# Patient Record
Sex: Male | Born: 1960 | Race: Black or African American | Hispanic: No | Marital: Single | Smoking: Current some day smoker
Health system: Southern US, Community
[De-identification: ages and names within clinical notes are randomized; demographics above are authoritative.]

## PROBLEM LIST (undated history)

## (undated) DIAGNOSIS — G4733 Obstructive sleep apnea (adult) (pediatric): Secondary | ICD-10-CM

## (undated) DIAGNOSIS — J309 Allergic rhinitis, unspecified: Secondary | ICD-10-CM

## (undated) DIAGNOSIS — G629 Polyneuropathy, unspecified: Secondary | ICD-10-CM

## (undated) DIAGNOSIS — Z8709 Personal history of other diseases of the respiratory system: Secondary | ICD-10-CM

## (undated) DIAGNOSIS — K219 Gastro-esophageal reflux disease without esophagitis: Secondary | ICD-10-CM

## (undated) DIAGNOSIS — M549 Dorsalgia, unspecified: Secondary | ICD-10-CM

## (undated) DIAGNOSIS — R7303 Prediabetes: Secondary | ICD-10-CM

## (undated) DIAGNOSIS — M1990 Unspecified osteoarthritis, unspecified site: Secondary | ICD-10-CM

## (undated) DIAGNOSIS — G8929 Other chronic pain: Secondary | ICD-10-CM

## (undated) DIAGNOSIS — I10 Essential (primary) hypertension: Secondary | ICD-10-CM

## (undated) DIAGNOSIS — F329 Major depressive disorder, single episode, unspecified: Secondary | ICD-10-CM

## (undated) DIAGNOSIS — M75101 Unspecified rotator cuff tear or rupture of right shoulder, not specified as traumatic: Secondary | ICD-10-CM

## (undated) DIAGNOSIS — G4730 Sleep apnea, unspecified: Secondary | ICD-10-CM

## (undated) DIAGNOSIS — Z973 Presence of spectacles and contact lenses: Secondary | ICD-10-CM

## (undated) DIAGNOSIS — F411 Generalized anxiety disorder: Secondary | ICD-10-CM

## (undated) DIAGNOSIS — F32A Depression, unspecified: Secondary | ICD-10-CM

## (undated) DIAGNOSIS — F419 Anxiety disorder, unspecified: Secondary | ICD-10-CM

## (undated) DIAGNOSIS — J45909 Unspecified asthma, uncomplicated: Secondary | ICD-10-CM

## (undated) DIAGNOSIS — E785 Hyperlipidemia, unspecified: Secondary | ICD-10-CM

## (undated) HISTORY — PX: LUMBAR SPINE SURGERY: SHX701

## (undated) HISTORY — DX: Essential (primary) hypertension: I10

## (undated) HISTORY — DX: Depression, unspecified: F32.A

## (undated) HISTORY — DX: Sleep apnea, unspecified: G47.30

## (undated) HISTORY — DX: Unspecified osteoarthritis, unspecified site: M19.90

## (undated) HISTORY — DX: Hyperlipidemia, unspecified: E78.5

---

## 1998-01-20 ENCOUNTER — Emergency Department (HOSPITAL_COMMUNITY): Admission: EM | Admit: 1998-01-20 | Discharge: 1998-01-20 | Payer: Self-pay | Admitting: Emergency Medicine

## 1999-04-29 ENCOUNTER — Emergency Department (HOSPITAL_COMMUNITY): Admission: EM | Admit: 1999-04-29 | Discharge: 1999-04-29 | Payer: Self-pay | Admitting: Emergency Medicine

## 2005-09-15 ENCOUNTER — Emergency Department: Payer: Self-pay | Admitting: Emergency Medicine

## 2007-03-17 ENCOUNTER — Emergency Department (HOSPITAL_COMMUNITY): Admission: EM | Admit: 2007-03-17 | Discharge: 2007-03-17 | Payer: Self-pay | Admitting: Emergency Medicine

## 2008-10-07 ENCOUNTER — Emergency Department (HOSPITAL_COMMUNITY): Admission: EM | Admit: 2008-10-07 | Discharge: 2008-10-07 | Payer: Self-pay | Admitting: Emergency Medicine

## 2009-11-14 ENCOUNTER — Emergency Department (HOSPITAL_COMMUNITY): Admission: EM | Admit: 2009-11-14 | Discharge: 2009-11-14 | Payer: Self-pay | Admitting: Emergency Medicine

## 2013-11-05 ENCOUNTER — Emergency Department (HOSPITAL_COMMUNITY)
Admission: EM | Admit: 2013-11-05 | Discharge: 2013-11-05 | Disposition: A | Discharge status: Home / Self Care | Payer: Worker's Compensation | Attending: Emergency Medicine | Admitting: Emergency Medicine

## 2013-11-05 ENCOUNTER — Encounter (HOSPITAL_COMMUNITY): Payer: Self-pay | Admitting: Emergency Medicine

## 2013-11-05 DIAGNOSIS — Y9389 Activity, other specified: Secondary | ICD-10-CM | POA: Insufficient documentation

## 2013-11-05 DIAGNOSIS — X500XXA Overexertion from strenuous movement or load, initial encounter: Secondary | ICD-10-CM | POA: Insufficient documentation

## 2013-11-05 DIAGNOSIS — S3992XA Unspecified injury of lower back, initial encounter: Secondary | ICD-10-CM

## 2013-11-05 DIAGNOSIS — F17200 Nicotine dependence, unspecified, uncomplicated: Secondary | ICD-10-CM | POA: Insufficient documentation

## 2013-11-05 DIAGNOSIS — Z880 Allergy status to penicillin: Secondary | ICD-10-CM | POA: Insufficient documentation

## 2013-11-05 DIAGNOSIS — IMO0002 Reserved for concepts with insufficient information to code with codable children: Secondary | ICD-10-CM | POA: Insufficient documentation

## 2013-11-05 DIAGNOSIS — Z791 Long term (current) use of non-steroidal anti-inflammatories (NSAID): Secondary | ICD-10-CM | POA: Insufficient documentation

## 2013-11-05 DIAGNOSIS — Y990 Civilian activity done for income or pay: Secondary | ICD-10-CM | POA: Insufficient documentation

## 2013-11-05 DIAGNOSIS — J45909 Unspecified asthma, uncomplicated: Secondary | ICD-10-CM | POA: Insufficient documentation

## 2013-11-05 DIAGNOSIS — Y929 Unspecified place or not applicable: Secondary | ICD-10-CM | POA: Insufficient documentation

## 2013-11-05 HISTORY — DX: Unspecified asthma, uncomplicated: J45.909

## 2013-11-05 MED ORDER — PREDNISONE 20 MG PO TABS
60.0000 mg | ORAL_TABLET | Freq: Once | ORAL | Status: AC
  Administered 2013-11-05: 60 mg via ORAL
  Filled 2013-11-05: qty 3

## 2013-11-05 MED ORDER — OXYCODONE HCL 5 MG PO TABS
10.0000 mg | ORAL_TABLET | Freq: Once | ORAL | Status: AC
  Administered 2013-11-05: 10 mg via ORAL
  Filled 2013-11-05: qty 2

## 2013-11-05 MED ORDER — DIAZEPAM 5 MG PO TABS
10.0000 mg | ORAL_TABLET | Freq: Once | ORAL | Status: AC
  Administered 2013-11-05: 10 mg via ORAL
  Filled 2013-11-05: qty 2

## 2013-11-05 MED ORDER — PREDNISONE 20 MG PO TABS: 20.0000 mg | ORAL_TABLET | Freq: Two times a day (BID) | ORAL | Status: DC

## 2013-11-05 MED ORDER — OXYCODONE HCL 5 MG PO TABS: 5.0000 mg | ORAL_TABLET | ORAL | Status: DC | PRN

## 2013-11-05 MED ORDER — DIAZEPAM 10 MG PO TABS: 10.0000 mg | ORAL_TABLET | Freq: Four times a day (QID) | ORAL | Status: DC | PRN

## 2013-11-05 NOTE — ED Notes (Signed)
Pt presents with lower back pain starting into his mid lower back radiating to Right side down into his Right leg then traveled back up to his Left lower back and the pain seems to radiate to his Right testicle. Pt states his symptoms started yesterday after lifting several 25 lb boxes while at work. Pt reports increase pain with any movement, unable to bend over and put his own socks on or take them off. Pt states his pain is constant with intermittent sharp shooting pain

## 2013-11-05 NOTE — ED Provider Notes (Signed)
CSN: [id number]     Arrival date & time 11/05/13  1248 History   First MD Initiated Contact with Patient 11/05/13 1308     Chief Complaint  Patient presents with  . Back Pain     (Consider location/radiation/quality/duration/timing/severity/associated sxs/prior Treatment) Patient is a 53 y.o. male presenting with back pain. The history is provided by the patient.  Back Pain  He injured his back yesterday while lifting at work. He reinjured it. This morning, again, lifting. The pain radiates to his right and left leg. He is able to ambulate. He denies fecal incontinence or perineal anesthesia. He does not have urinary incontinence, but he did dribble some after urinating, today. He denies fever, chills, nausea, vomiting, isolated, weakness, or paresthesias. He has not tried any medication for the problem. He does not have any ongoing back pain. There are no other known modifying factors.  Past Medical History  Diagnosis Date  . Asthma    History reviewed. No pertinent past surgical history. History reviewed. No pertinent family history. History  Substance Use Topics  . Smoking status: Current Every Day Smoker  . Smokeless tobacco: Not on file  . Alcohol Use: Yes    Review of Systems  Musculoskeletal: Positive for back pain.  All other systems reviewed and are negative.     Allergies  Tylenol and Penicillins  Home Medications   Prior to Admission medications   Medication Sig Start Date End Date Taking? Authorizing Provider  cetirizine-pseudoephedrine (ZYRTEC-D) 5-120 MG per tablet Take 1 tablet by mouth daily as needed for allergies.   Yes Historical Provider, MD  meloxicam (MOBIC) 15 MG tablet Take 15 mg by mouth daily.   Yes Historical Provider, MD   BP 129/89  Pulse 75  Temp(Src) 98.6 F (37 C) (Oral)  Resp 18  Wt 240 lb (108.863 kg)  SpO2 97% Physical Exam  Nursing note and vitals reviewed. Constitutional: He is oriented to Klecker, place, and time. He appears  well-developed and well-nourished.  HENT:  Head: Normocephalic and atraumatic.  Right Ear: External ear normal.  Left Ear: External ear normal.  Eyes: Conjunctivae and EOM are normal. Pupils are equal, round, and reactive to light.  Neck: Normal range of motion and phonation normal. Neck supple.  Cardiovascular: Normal rate, regular rhythm, normal heart sounds and intact distal pulses.   Pulmonary/Chest: Effort normal and breath sounds normal. He exhibits no bony tenderness.  Abdominal: Soft. There is no tenderness.  Musculoskeletal: Normal range of motion.  Diffuse lumbar tenderness, in the midline, and bilaterally, with decreased flexion of the back, secondary to pain. Is able to walk with a normal gait.  Neurological: He is alert and oriented to Louks, place, and time. No cranial nerve deficit or sensory deficit. He exhibits normal muscle tone. Coordination normal.  Skin: Skin is warm, dry and intact.  Psychiatric: He has a normal mood and affect. His behavior is normal. Judgment and thought content normal.    ED Course  Procedures (including critical care time)  Medications  diazepam (VALIUM) tablet 10 mg (10 mg Oral Given 11/05/13 1431)  predniSONE (DELTASONE) tablet 60 mg (60 mg Oral Given 11/05/13 1431)  oxyCODONE (Oxy IR/ROXICODONE) immediate release tablet 10 mg (10 mg Oral Given 11/05/13 1431)    No data found.      Labs Review Labs Reviewed - No data to display  Imaging Review No results found.   EKG Interpretation None      MDM   Final diagnoses:  Back  injury    Muscle strain, back, causing pain. Doubt cauda equina syndrome, acute herniated disc or nerve impingement.  Nursing Notes Reviewed/ Care Coordinated Applicable Imaging Reviewed Interpretation of Laboratory Data incorporated into ED treatment  The patient appears reasonably screened and/or stabilized for discharge and I doubt any other medical condition or other [facility] requiring further screening,  evaluation, or treatment in the ED at this time prior to discharge.  Plan: Home Medications- oxycodone, prednisone; Home Treatments- rest ice; return here if the recommended treatment, does not improve the symptoms; Recommended follow up- PCP, when necessary    Richarda Blade, MD 11/07/13 [phone]

## 2013-11-05 NOTE — Discharge Instructions (Signed)
Back Exercises  Back exercises help treat and prevent back injuries. The goal is to increase your strength in your belly (abdominal) and back muscles. These exercises can also help with flexibility. Start these exercises when told by your doctor.  HOME CARE  Back exercises include:  Pelvic Tilt.  · Lie on your back with your knees bent. Tilt your pelvis until the lower part of your back is against the floor. Hold this position 5 to 10 sec. Repeat this exercise 5 to 10 times.  Knee to Chest.  · Pull 1 knee up against your chest and hold for 20 to 30 seconds. Repeat this with the other knee. This may be done with the other leg straight or bent, whichever feels better. Then, pull both knees up against your chest.  Sit-Ups or Curl-Ups.  · Bend your knees 90 degrees. Start with tilting your pelvis, and do a partial, slow sit-up. Only lift your upper half 30 to 45 degrees off the floor. Take at least 2 to 3 seonds for each sit-up. Do not do sit-ups with your knees out straight. If partial sit-ups are difficult, simply do the above but with only tightening your belly (abdominal) muscles and holding it as told.  Hip-Lift.  · Lie on your back with your knees flexed 90 degrees. Push down with your feet and shoulders as you raise your hips 2 inches off the floor. Hold for 10 seconds, repeat 5 to 10 times.  Back Arches.  · Lie on your stomach. Prop yourself up on bent elbows. Slowly press on your hands, causing an arch in your low back. Repeat 3 to 5 times.  Shoulder-Lifts.  · Lie face down with arms beside your body. Keep hips and belly pressed to floor as you slowly lift your head and shoulders off the floor.  Do not overdo your exercises. Be careful in the beginning. Exercises may cause you some mild back discomfort. If the pain lasts for more than 15 minutes, stop the exercises until you see your doctor. Improvement with exercise for back problems is slow.   Document Released: 07/18/2010 Document Revised: 09/07/2011  Document Reviewed: 04/16/2011  ExitCare® Patient Information ©2014 [employer].

## 2013-12-03 ENCOUNTER — Encounter (HOSPITAL_COMMUNITY): Payer: Self-pay | Admitting: Emergency Medicine

## 2013-12-03 ENCOUNTER — Emergency Department (HOSPITAL_COMMUNITY)
Admission: EM | Admit: 2013-12-03 | Discharge: 2013-12-03 | Disposition: A | Discharge status: Home / Self Care | Payer: Worker's Compensation | Attending: Emergency Medicine | Admitting: Emergency Medicine

## 2013-12-03 DIAGNOSIS — J45909 Unspecified asthma, uncomplicated: Secondary | ICD-10-CM | POA: Insufficient documentation

## 2013-12-03 DIAGNOSIS — M549 Dorsalgia, unspecified: Secondary | ICD-10-CM

## 2013-12-03 DIAGNOSIS — Y929 Unspecified place or not applicable: Secondary | ICD-10-CM | POA: Insufficient documentation

## 2013-12-03 DIAGNOSIS — W1809XA Striking against other object with subsequent fall, initial encounter: Secondary | ICD-10-CM | POA: Insufficient documentation

## 2013-12-03 DIAGNOSIS — Z880 Allergy status to penicillin: Secondary | ICD-10-CM | POA: Insufficient documentation

## 2013-12-03 DIAGNOSIS — S336XXA Sprain of sacroiliac joint, initial encounter: Secondary | ICD-10-CM | POA: Insufficient documentation

## 2013-12-03 DIAGNOSIS — Y9389 Activity, other specified: Secondary | ICD-10-CM | POA: Insufficient documentation

## 2013-12-03 DIAGNOSIS — S239XXA Sprain of unspecified parts of thorax, initial encounter: Secondary | ICD-10-CM | POA: Insufficient documentation

## 2013-12-03 DIAGNOSIS — F17200 Nicotine dependence, unspecified, uncomplicated: Secondary | ICD-10-CM | POA: Insufficient documentation

## 2013-12-03 DIAGNOSIS — S29012A Strain of muscle and tendon of back wall of thorax, initial encounter: Secondary | ICD-10-CM

## 2013-12-03 MED ORDER — MORPHINE SULFATE 4 MG/ML IJ SOLN
6.0000 mg | Freq: Once | INTRAMUSCULAR | Status: AC
  Administered 2013-12-03: 6 mg via INTRAMUSCULAR
  Filled 2013-12-03: qty 2

## 2013-12-03 MED ORDER — DIAZEPAM 5 MG PO TABS: 5.0000 mg | ORAL_TABLET | Freq: Four times a day (QID) | ORAL | Status: DC | PRN

## 2013-12-03 MED ORDER — IBUPROFEN 600 MG PO TABS: 600.0000 mg | ORAL_TABLET | Freq: Four times a day (QID) | ORAL | Status: DC | PRN

## 2013-12-03 MED ORDER — KETOROLAC TROMETHAMINE 30 MG/ML IJ SOLN
30.0000 mg | Freq: Once | INTRAMUSCULAR | Status: AC
  Administered 2013-12-03: 30 mg via INTRAVENOUS
  Filled 2013-12-03: qty 1

## 2013-12-03 MED ORDER — TRAMADOL HCL 50 MG PO TABS: 50.0000 mg | ORAL_TABLET | Freq: Four times a day (QID) | ORAL | Status: DC | PRN

## 2013-12-03 NOTE — ED Notes (Signed)
Pt. lost his balance and fell while lifting something this evening , no LOC / ambulatory , reports pain at right lower back / denies hematuria , alert and oriented / respirations unlabored . Pt. stated history of chronic low back pain .

## 2013-12-03 NOTE — ED Notes (Signed)
Pt states that he hurt his back a few weeks ago, lifted heavy box today and had sharp pain in back while lifting, fell back and hit the ground. Denies hitting head. Pain radiated down right leg and up right neck, over to left side.

## 2013-12-03 NOTE — ED Provider Notes (Signed)
CSN: [id number]     Arrival date & time 12/03/13  0009 History   First MD Initiated Contact with Patient 12/03/13 0109     Chief Complaint  Patient presents with  . Fall  . Back Pain     (Consider location/radiation/quality/duration/timing/severity/associated sxs/prior Treatment) HPI This patient is a 53 yo man in generally good health who has a recent history of right low back strain. Tonight, just pta, he was picking up an object when he had acute sharp pain to right low back. This caused him to fall backward, landing on his bottom. He felt backward because of jolt of severe pain. Pain has been persistent. It is aching, sharp and worse with movements of the torso. No radiation of pain, no paresthesias or motor weakness. Patient did not take any meds PTA.   Past Medical History  Diagnosis Date  . Asthma    History reviewed. No pertinent past surgical history. No family history on file. History  Substance Use Topics  . Smoking status: Current Every Day Smoker  . Smokeless tobacco: Not on file  . Alcohol Use: Yes    Review of Systems Ten point review of symptoms performed and is negative with the exception of symptoms noted above.     Allergies  Tylenol and Penicillins  Home Medications   Prior to Admission medications   Not on File   BP 134/85  Pulse 78  Temp(Src) 97.8 F (36.6 C) (Oral)  Resp 14  Wt 243 lb 5 oz (110.366 kg)  SpO2 98% Physical Exam Gen: well developed and well nourished appearing Head: NCAT Eyes: PERL, EOMI Nose: no epistaixis or rhinorrhea Mouth/throat: mucosa is moist and pink Neck: supple, no stridor Lungs: CTA B, no wheezing, rhonchi or rales CV: RRR, no murmur, extremities appear well perfused.  Abd: soft, notender, nondistended Back: no midline ttp, ttp over the right QL muscle with palpable muscle spasm and reproducible pain on palpation of this area.  Skin: warm and dry Ext: normal to inspection, no dependent edema Neuro: CN ii-xii  grossly intact, no focal deficits, motor strength 5/5 both legs with normal DTRs Psyche; normal affect,  calm and cooperative.   ED Course  Procedures (including critical care time)   MDM   Acute low back pain secondary muscle strain. We are managing symptomatically and will discharge.     Elyn Peers, MD 12/03/13 [phone]

## 2013-12-03 NOTE — Discharge Instructions (Signed)
Back Pain, Adult  Back pain is very common. The pain often gets better over time. The cause of back pain is usually not dangerous. Most people can learn to manage their back pain on their own.   HOME CARE   · Stay active. Start with short walks on flat ground if you can. Try to walk farther each day.  · Do not sit, drive, or stand in one place for more than 30 minutes. Do not stay in bed.  · Do not avoid exercise or work. Activity can help your back heal faster.  · Be careful when you bend or lift an object. Bend at your knees, keep the object close to you, and do not twist.  · Sleep on a firm mattress. Lie on your side, and bend your knees. If you lie on your back, put a pillow under your knees.  · Only take medicines as told by your doctor.  · Put ice on the injured area.  · Put ice in a plastic bag.  · Place a towel between your skin and the bag.  · Leave the ice on for 15-20 minutes, 03-04 times a day for the first 2 to 3 days. After that, you can switch between ice and heat packs.  · Ask your doctor about back exercises or massage.  · Avoid feeling anxious or stressed. Find good ways to deal with stress, such as exercise.  GET HELP RIGHT AWAY IF:   · Your pain does not go away with rest or medicine.  · Your pain does not go away in 1 week.  · You have new problems.  · You do not feel well.  · The pain spreads into your legs.  · You cannot control when you poop (bowel movement) or pee (urinate).  · Your arms or legs feel weak or lose feeling (numbness).  · You feel sick to your stomach (nauseous) or throw up (vomit).  · You have belly (abdominal) pain.  · You feel like you may pass out (faint).  MAKE SURE YOU:   · Understand these instructions.  · Will watch your condition.  · Will get help right away if you are not doing well or get worse.  Document Released: 12/02/2007 Document Revised: 09/07/2011 Document Reviewed: 11/03/2010  ExitCare® Patient Information ©2014 [employer].

## 2013-12-05 ENCOUNTER — Ambulatory Visit (HOSPITAL_BASED_OUTPATIENT_CLINIC_OR_DEPARTMENT_OTHER)
Admission: RE | Admit: 2013-12-05 | Discharge: 2013-12-05 | Disposition: A | Discharge status: Home / Self Care | Payer: Worker's Compensation | Source: Ambulatory Visit | Attending: Family Medicine | Admitting: Family Medicine

## 2013-12-05 ENCOUNTER — Ambulatory Visit (INDEPENDENT_AMBULATORY_CARE_PROVIDER_SITE_OTHER): Payer: Worker's Compensation | Admitting: Family Medicine

## 2013-12-05 ENCOUNTER — Encounter: Payer: Self-pay | Admitting: Family Medicine

## 2013-12-05 VITALS — BP 144/93 | HR 69 | Ht 75.0 in | Wt 240.0 lb

## 2013-12-05 DIAGNOSIS — IMO0002 Reserved for concepts with insufficient information to code with codable children: Secondary | ICD-10-CM

## 2013-12-05 DIAGNOSIS — M545 Low back pain, unspecified: Secondary | ICD-10-CM

## 2013-12-05 DIAGNOSIS — M47817 Spondylosis without myelopathy or radiculopathy, lumbosacral region: Secondary | ICD-10-CM | POA: Insufficient documentation

## 2013-12-05 DIAGNOSIS — S3992XA Unspecified injury of lower back, initial encounter: Secondary | ICD-10-CM

## 2013-12-05 MED ORDER — OXYCODONE HCL 5 MG PO TABS: 5.0000 mg | ORAL_TABLET | Freq: Four times a day (QID) | ORAL | Status: DC | PRN

## 2013-12-05 MED ORDER — PREDNISONE (PAK) 10 MG PO TABS: ORAL_TABLET | ORAL | Status: DC

## 2013-12-05 NOTE — Patient Instructions (Signed)
Get x-rays before you leave today. We will arrange an MRI of your lumbar spine. Take prednisone for 6 days as directed. Oxycodone as needed. Follow up will depend on the MRI results. Out of work for 2 weeks.

## 2013-12-07 ENCOUNTER — Encounter: Payer: Self-pay | Admitting: Family Medicine

## 2013-12-07 ENCOUNTER — Other Ambulatory Visit: Payer: Self-pay | Admitting: *Deleted

## 2013-12-07 DIAGNOSIS — S3992XA Unspecified injury of lower back, initial encounter: Secondary | ICD-10-CM

## 2013-12-07 NOTE — Progress Notes (Addendum)
Patient ID: Todd Ryan, male   DOB: [birth year], 53 y.o.   MRN: [id number]  PCP: No PCP Per Patient  Subjective:   HPI: Patient is a 53 y.o. male here for low back injury.  Patient reports on 5/9 he was lifting a heavy 5 gallon container of sugar at work when he felt a pull in right side of low back. Improved some from this until he believes 6/7 when he was pulling a box from a van at work, stepped backwards and fell to his right side sustaining worsening sharp pain in same area right side of back. Pain radiates down to knee, into right testicle. No numbness or tingling. No bowel/bladder dysfunction or saddle anesthesia. Went to ED and given valium, ibuprofen and tramadol which he's been taking. No radiographs done.  Past Medical History  Diagnosis Date  . Asthma     No current outpatient prescriptions on file prior to visit.   No current facility-administered medications on file prior to visit.    History reviewed. No pertinent past surgical history.  Allergies  Allergen Reactions  . Tylenol [Acetaminophen] Anaphylaxis  . Penicillins Hives    History   Social History  . Marital Status: Married    Spouse Name: N/A    Number of Children: N/A  . Years of Education: N/A   Occupational History  . Not on file.   Social History Main Topics  . Smoking status: Current Every Day Smoker  . Smokeless tobacco: Not on file  . Alcohol Use: Yes  . Drug Use: No  . Sexual Activity: Not on file   Other Topics Concern  . Not on file   Social History Narrative  . No narrative on file    History reviewed. No pertinent family history.  BP 144/93  Pulse 69  Ht 6\' 3"  (1.905 m)  Wt 240 lb (108.863 kg)  BMI 30.00 kg/m2  Review of Systems: See HPI above.    Objective:  Physical Exam:  Gen: NAD but appears uncomfortable.  Back: No gross deformity, scoliosis. TTP right lumbar paraspinal region.  No midline or bony TTP. ROM 30 degrees flexion, 10 degrees extension but  both painful. Strength LEs 5/5 all muscle groups.   2+ MSRs in patellar and achilles tendons, equal bilaterally. Mild positive SLR on right, negative left. Sensation intact to light touch bilaterally. Negative logroll bilateral hips Negative fabers and piriformis stretches.    Assessment & Plan:  1. Low back injury - consistent with lumbar radiculopathy from herniated disc in addition to lumbar spasms.  Given prednisone dose pack to take for 6 days with oxycodone as needed.  Will go ahead with MRI given severity, radiation into genital region and that he's not improving as expected.  Out of work for 2 weeks while we obtain MRI.  Plan to ahead with with possible ESI, neurosurgery referral, PT depending on those results.  Addendum:  MRI report received, reviewed, and discussed with patient.  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  Still having symptoms radiating into right genital region.  No incontinence.  He would like to try ESI at this level - will arrange and have him f/u 2 weeks after this.  Consider PT, repeat injection depending on how he's doing.  Continue out of work until follow-up.

## 2013-12-07 NOTE — Assessment & Plan Note (Signed)
consistent with lumbar radiculopathy from herniated disc in addition to lumbar spasms.  Given prednisone dose pack to take for 6 days with oxycodone as needed.  Will go ahead with MRI given severity, radiation into genital region and that he's not improving as expected.  Out of work for 2 weeks while we obtain MRI.  Plan to ahead with with possible ESI, neurosurgery referral, PT depending on those results.

## 2013-12-12 ENCOUNTER — Telehealth: Payer: Self-pay | Admitting: Family Medicine

## 2013-12-12 NOTE — Telephone Encounter (Signed)
We are waiting to hear back from worker's comp regarding his MRI approval.  But his x-rays didn't show anything I would suspect causing his back pain - he does have some degenerative disc disease which is not unusual.

## 2013-12-18 ENCOUNTER — Other Ambulatory Visit: Payer: Self-pay | Admitting: *Deleted

## 2013-12-18 ENCOUNTER — Telehealth: Payer: Self-pay | Admitting: Family Medicine

## 2013-12-18 DIAGNOSIS — Z1389 Encounter for screening for other disorder: Secondary | ICD-10-CM

## 2013-12-18 MED ORDER — DIAZEPAM 5 MG PO TABS: ORAL_TABLET | ORAL | Status: DC

## 2013-12-18 NOTE — Telephone Encounter (Signed)
Ok to call in valium 5mg  - 1 tablet 30 minutes prior to procedure, can repeat x1.  Dispense 2 tabs with 0 refills.  Thanks!

## 2013-12-25 MED ORDER — OXYCODONE HCL 5 MG PO TABS: 5.0000 mg | ORAL_TABLET | Freq: Four times a day (QID) | ORAL | Status: DC | PRN

## 2013-12-25 NOTE — Addendum Note (Signed)
Addended by: Dene Gentry on: 12/25/2013 03:21 PM   Modules accepted: Orders

## 2014-01-03 ENCOUNTER — Other Ambulatory Visit: Payer: Self-pay | Admitting: Family Medicine

## 2014-01-03 DIAGNOSIS — M5416 Radiculopathy, lumbar region: Secondary | ICD-10-CM

## 2014-01-03 DIAGNOSIS — M5126 Other intervertebral disc displacement, lumbar region: Secondary | ICD-10-CM

## 2014-01-05 ENCOUNTER — Ambulatory Visit
Admission: RE | Admit: 2014-01-05 | Discharge: 2014-01-05 | Disposition: A | Discharge status: Home / Self Care | Payer: Worker's Compensation | Source: Ambulatory Visit | Attending: Family Medicine | Admitting: Family Medicine

## 2014-01-05 DIAGNOSIS — M5126 Other intervertebral disc displacement, lumbar region: Secondary | ICD-10-CM

## 2014-01-05 DIAGNOSIS — M5416 Radiculopathy, lumbar region: Secondary | ICD-10-CM

## 2014-01-05 MED ORDER — METHYLPREDNISOLONE ACETATE 40 MG/ML INJ SUSP (RADIOLOG
120.0000 mg | Freq: Once | INTRAMUSCULAR | Status: AC
  Administered 2014-01-05: 120 mg via EPIDURAL

## 2014-01-05 MED ORDER — IOHEXOL 180 MG/ML  SOLN
1.0000 mL | Freq: Once | INTRAMUSCULAR | Status: AC | PRN
  Administered 2014-01-05: 1 mL via EPIDURAL

## 2014-01-05 NOTE — Discharge Instructions (Signed)

## 2014-01-10 ENCOUNTER — Telehealth: Payer: Self-pay | Admitting: Family Medicine

## 2014-01-10 NOTE — Telephone Encounter (Signed)
This is not uncommon especially in the lag period between the numbing medicine (wears off within a day) and the cortisone (can take 1-2 weeks to kick in).  I would reassure him, take medicines as needed.  If still having trouble in a week we can talk about either repeating injection, physical therapy, or neurosurgery referral.

## 2014-01-10 NOTE — Telephone Encounter (Signed)
Spoke with patient and he stated that he got the [facility] on Friday (10th) and that on Sunday, he was having some shooting pain in his back due to standing for long periods of time.

## 2014-01-19 ENCOUNTER — Telehealth: Payer: Self-pay | Admitting: Family Medicine

## 2014-01-19 ENCOUNTER — Other Ambulatory Visit: Payer: Self-pay | Admitting: *Deleted

## 2014-01-19 MED ORDER — TRAMADOL HCL 50 MG PO TABS: 50.0000 mg | ORAL_TABLET | Freq: Three times a day (TID) | ORAL | Status: DC

## 2014-01-19 NOTE — Telephone Encounter (Signed)
We would have to step him down to tramadol if he wants further pain medicine - we would not refill this though.  Last phone call a week and a half ago I said if he was still having trouble in a week we would talk about either repeating injection, physical therapy, or neurosurgery referral.  Does he want to do any of these?

## 2014-01-22 ENCOUNTER — Telehealth: Payer: Self-pay | Admitting: Family Medicine

## 2014-01-22 DIAGNOSIS — M545 Low back pain, unspecified: Secondary | ICD-10-CM

## 2014-01-22 NOTE — Telephone Encounter (Signed)
Spoke with patient and told him that we would do Tramadol. Patient also stated that he wants to do PT. Will have to go through [facility] to set up the PT.

## 2014-01-23 ENCOUNTER — Other Ambulatory Visit: Payer: Self-pay | Admitting: *Deleted

## 2014-01-23 DIAGNOSIS — M545 Low back pain, unspecified: Secondary | ICD-10-CM

## 2014-01-23 MED ORDER — METHOCARBAMOL 500 MG PO TABS
500.0000 mg | ORAL_TABLET | Freq: Three times a day (TID) | ORAL | Status: DC | PRN
Start: 2014-01-23 — End: 2018-06-02

## 2014-01-23 MED ORDER — DICLOFENAC SODIUM 75 MG PO TBEC: 75.0000 mg | DELAYED_RELEASE_TABLET | Freq: Two times a day (BID) | ORAL | Status: DC

## 2014-01-23 NOTE — Telephone Encounter (Signed)
Will try combination of voltaren, robaxin, with tramadol as needed.  Also advised can try capsaicin topically.  He still hasn't heard about physical therapy from workers comp.

## 2014-02-28 ENCOUNTER — Ambulatory Visit (INDEPENDENT_AMBULATORY_CARE_PROVIDER_SITE_OTHER): Payer: Worker's Compensation | Admitting: Family Medicine

## 2014-02-28 ENCOUNTER — Encounter: Payer: Self-pay | Admitting: Family Medicine

## 2014-02-28 VITALS — BP 132/87 | HR 79 | Ht 74.0 in | Wt 235.0 lb

## 2014-02-28 DIAGNOSIS — M5430 Sciatica, unspecified side: Secondary | ICD-10-CM

## 2014-02-28 DIAGNOSIS — Z5189 Encounter for other specified aftercare: Secondary | ICD-10-CM | POA: Diagnosis not present

## 2014-02-28 DIAGNOSIS — IMO0002 Reserved for concepts with insufficient information to code with codable children: Secondary | ICD-10-CM | POA: Diagnosis not present

## 2014-02-28 DIAGNOSIS — M5441 Lumbago with sciatica, right side: Secondary | ICD-10-CM

## 2014-02-28 DIAGNOSIS — S3992XD Unspecified injury of lower back, subsequent encounter: Secondary | ICD-10-CM

## 2014-02-28 MED ORDER — OXYCODONE-ACETAMINOPHEN 5-325 MG PO TABS: 1.0000 | ORAL_TABLET | Freq: Four times a day (QID) | ORAL | Status: DC | PRN

## 2014-02-28 MED ORDER — PREDNISONE (PAK) 10 MG PO TABS: ORAL_TABLET | ORAL | Status: DC

## 2014-02-28 NOTE — Patient Instructions (Signed)
We will arrange to have the injection repeated in your back. Take prednisone as directed for 6 days. Oxycodone as needed for severe pain (we cannot refill this medicine). Call me a week after the shot to let me know how you're doing. Continue with physical therapy. If not improving we will refer you to neurosurgery for a consultation.

## 2014-03-01 ENCOUNTER — Encounter: Payer: Self-pay | Admitting: Family Medicine

## 2014-03-01 NOTE — Assessment & Plan Note (Signed)
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  Herniation due to accident at work.  Only been to PT for 2 weeks but pain still very severe.  Discussed options at this point - will repeat ESI at right L4-5.  Continue PT.  Voltaren, robaxin, with oxycodone as needed (no refills).  Call a week following the injection.  If still struggling advised would refer to neurosurgery for consultation.

## 2014-03-01 NOTE — Progress Notes (Signed)
Patient ID: Todd Ryan, male   DOB: 1961-06-06, 53 y.o.   MRN: [id number]  PCP: No PCP Per Patient  Subjective:   HPI: Patient is a 53 y.o. male here for low back injury.  6/9: Patient reports on 5/9 he was lifting a heavy 5 gallon container of sugar at work when he felt a pull in right side of low back. Improved some from this until he believes 6/7 when he was pulling a box from a van at work, stepped backwards and fell to his right side sustaining worsening sharp pain in same area right side of back. Pain radiates down to knee, into right testicle. No numbness or tingling. No bowel/bladder dysfunction or saddle anesthesia. Went to ED and given valium, ibuprofen and tramadol which he's been taking. No radiographs done.  9/2: Patient reports he had been doing better following injection. However pain has worsened recently. Just started doing PT 2 weeks ago - lag in approval from workers comp - going to [facility] PT. Pain persistent, 10/10 since yesterday. Radiating down right leg and into right groin. Muscle relaxant, tramadol not helping. No bowel/bladder dysfunction.  Past Medical History  Diagnosis Date  . Asthma     Current Outpatient Prescriptions on File Prior to Visit  Medication Sig Dispense Refill  . diclofenac (VOLTAREN) 75 MG EC tablet Take 1 tablet (75 mg total) by mouth 2 (two) times daily. With food  60 tablet  1  . methocarbamol (ROBAXIN) 500 MG tablet Take 1 tablet (500 mg total) by mouth every 8 (eight) hours as needed for muscle spasms.  90 tablet  1   No current facility-administered medications on file prior to visit.    History reviewed. No pertinent past surgical history.  Allergies  Allergen Reactions  . Penicillins Hives    History   Social History  . Marital Status: Married    Spouse Name: N/A    Number of Children: N/A  . Years of Education: N/A   Occupational History  . Not on file.   Social History Main Topics  . Smoking  status: Current Every Day Smoker  . Smokeless tobacco: Not on file  . Alcohol Use: Yes  . Drug Use: No  . Sexual Activity: Not on file   Other Topics Concern  . Not on file   Social History Narrative  . No narrative on file    History reviewed. No pertinent family history.  BP 132/87  Pulse 79  Ht 6\' 2"  (1.88 m)  Wt 235 lb (106.595 kg)  BMI 30.16 kg/m2  Review of Systems: See HPI above.    Objective:  Physical Exam:  Gen: NAD but appears uncomfortable.  Back: No gross deformity, scoliosis. TTP right lumbar paraspinal region.  No midline or bony TTP. ROM 30 degrees flexion, 10 degrees extension but both painful. Strength LEs 5/5 all muscle groups.   2+ MSRs in patellar and achilles tendons, equal bilaterally. Mild positive SLR on right, negative left. Sensation intact to light touch bilaterally. Negative logroll bilateral hips Negative fabers and piriformis stretches.    Assessment & Plan:  1. 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  Herniation due to accident at work.  Only been to PT for 2 weeks but pain still very severe.  Discussed options at this point - will repeat ESI at right L4-5.  Continue PT.  Voltaren, robaxin, with oxycodone as needed (no refills).  Call a week following the injection.  If still struggling  advised would refer to neurosurgery for consultation.

## 2014-03-08 ENCOUNTER — Telehealth: Payer: Self-pay | Admitting: Family Medicine

## 2014-03-08 NOTE — Telephone Encounter (Signed)
Spoke with patient and told him that we have not received anything from [facility]. Said he would call them and get to them to resend fax.

## 2014-03-20 ENCOUNTER — Telehealth: Payer: Self-pay | Admitting: Family Medicine

## 2014-03-21 NOTE — Telephone Encounter (Signed)
Representative from [facility] left message. Returned call. Placed call and received message that office was closed for the day on 03-21-14. Will call on 03-22-14.

## 2014-03-23 ENCOUNTER — Other Ambulatory Visit: Payer: Self-pay | Admitting: Family Medicine

## 2014-03-23 DIAGNOSIS — M5416 Radiculopathy, lumbar region: Secondary | ICD-10-CM

## 2014-03-23 DIAGNOSIS — M5126 Other intervertebral disc displacement, lumbar region: Secondary | ICD-10-CM

## 2014-03-23 NOTE — Telephone Encounter (Signed)
Got in touch with WC adjuster. She stated that they have been having problems with the computers. She will be faxing an approval for second ESI injection.

## 2014-03-30 ENCOUNTER — Ambulatory Visit
Admission: RE | Admit: 2014-03-30 | Discharge: 2014-03-30 | Disposition: A | Discharge status: Home / Self Care | Payer: Worker's Compensation | Source: Ambulatory Visit | Attending: Family Medicine | Admitting: Family Medicine

## 2014-03-30 VITALS — BP 154/88 | HR 62

## 2014-03-30 DIAGNOSIS — S3992XD Unspecified injury of lower back, subsequent encounter: Secondary | ICD-10-CM

## 2014-03-30 DIAGNOSIS — M5126 Other intervertebral disc displacement, lumbar region: Secondary | ICD-10-CM

## 2014-03-30 DIAGNOSIS — M5416 Radiculopathy, lumbar region: Secondary | ICD-10-CM

## 2014-03-30 MED ORDER — IOHEXOL 180 MG/ML  SOLN
1.0000 mL | Freq: Once | INTRAMUSCULAR | Status: AC | PRN
  Administered 2014-03-30: 1 mL via INTRAVENOUS

## 2014-03-30 MED ORDER — METHYLPREDNISOLONE ACETATE 40 MG/ML INJ SUSP (RADIOLOG
120.0000 mg | Freq: Once | INTRAMUSCULAR | Status: AC
  Administered 2014-03-30: 120 mg via EPIDURAL

## 2014-05-28 NOTE — Addendum Note (Signed)
Addended by: Sherrie George F on: 05/28/2014 09:44 AM   Modules accepted: Orders

## 2014-06-06 ENCOUNTER — Telehealth: Payer: Self-pay | Admitting: *Deleted

## 2014-06-06 NOTE — Telephone Encounter (Signed)
-----   Message from Crista Luria sent at 06/06/2014  1:29 PM EST ----- Regarding: ESI authorization, etc. Contact: [phone] Ms. Nelson from [employer] called to authorize/ok  Mr. Liwanag ESI.  If you need to speak to her, you can call [phone].  Also Mr. Babington called right after that and left voicemail for you to call him as well, yep.

## 2014-06-07 ENCOUNTER — Other Ambulatory Visit: Payer: Self-pay | Admitting: Family Medicine

## 2014-06-07 DIAGNOSIS — M47817 Spondylosis without myelopathy or radiculopathy, lumbosacral region: Secondary | ICD-10-CM

## 2014-06-13 ENCOUNTER — Ambulatory Visit
Admission: RE | Admit: 2014-06-13 | Discharge: 2014-06-13 | Disposition: A | Discharge status: Home / Self Care | Payer: Worker's Compensation | Source: Ambulatory Visit | Attending: Family Medicine | Admitting: Family Medicine

## 2014-06-13 DIAGNOSIS — M47817 Spondylosis without myelopathy or radiculopathy, lumbosacral region: Secondary | ICD-10-CM

## 2014-06-13 MED ORDER — METHYLPREDNISOLONE ACETATE 40 MG/ML INJ SUSP (RADIOLOG
120.0000 mg | Freq: Once | INTRAMUSCULAR | Status: AC
  Administered 2014-06-13: 120 mg via EPIDURAL

## 2014-06-13 MED ORDER — IOHEXOL 180 MG/ML  SOLN
1.0000 mL | Freq: Once | INTRAMUSCULAR | Status: AC | PRN
  Administered 2014-06-13: 1 mL via EPIDURAL

## 2014-06-14 ENCOUNTER — Telehealth: Payer: Self-pay | Admitting: *Deleted

## 2014-06-14 ENCOUNTER — Other Ambulatory Visit: Payer: Self-pay | Admitting: *Deleted

## 2014-06-14 MED ORDER — TRAMADOL HCL 50 MG PO TABS: 50.0000 mg | ORAL_TABLET | Freq: Three times a day (TID) | ORAL | Status: DC | PRN

## 2014-06-14 NOTE — Telephone Encounter (Signed)
Patient called wanting something for pain. Had Percocet previously, however, we do not refill this medication. Told patient we could do Tramadol.

## 2014-07-02 ENCOUNTER — Telehealth: Payer: Self-pay | Admitting: *Deleted

## 2014-07-02 NOTE — Telephone Encounter (Signed)
Spoke with patient - he is willing to go ahead with both neurosurgery referral to get their opinion regarding possible surgical intervention for single level disc herniation and will also refer to pain management for further evaluation and treatment.  Again reviewed our narcotics policy - strongest we would give at this point is tramadol which he has.

## 2014-07-02 NOTE — Telephone Encounter (Signed)
Spoke with patient on 06-28-14 and told patient that the physician wanted him to see neurosurgery. Patient does not want to see neurosurgery, but would like to see pain management. Patient also wanted stronger pain medication stating that the Tramadol was not helping.

## 2014-07-04 ENCOUNTER — Other Ambulatory Visit: Payer: Self-pay | Admitting: *Deleted

## 2014-07-04 DIAGNOSIS — M5126 Other intervertebral disc displacement, lumbar region: Secondary | ICD-10-CM

## 2014-07-06 ENCOUNTER — Encounter: Payer: Self-pay | Admitting: Family Medicine

## 2014-07-06 ENCOUNTER — Ambulatory Visit (INDEPENDENT_AMBULATORY_CARE_PROVIDER_SITE_OTHER): Payer: Worker's Compensation | Admitting: Family Medicine

## 2014-07-06 VITALS — BP 156/95 | HR 76 | Ht 75.0 in | Wt 235.0 lb

## 2014-07-06 DIAGNOSIS — S3992XD Unspecified injury of lower back, subsequent encounter: Secondary | ICD-10-CM

## 2014-07-11 NOTE — Progress Notes (Signed)
Patient ID: Todd Ryan, male   DOB: Jan 27, 1961, 54 y.o.   MRN: [id number]  PCP: No PCP Per Patient  Subjective:   HPI: Patient is a 54 y.o. male here for low back injury.  6/9: Patient reports on 5/9 he was lifting a heavy 5 gallon container of sugar at work when he felt a pull in right side of low back. Improved some from this until he believes 6/7 when he was pulling a box from a van at work, stepped backwards and fell to his right side sustaining worsening sharp pain in same area right side of back. Pain radiates down to knee, into right testicle. No numbness or tingling. No bowel/bladder dysfunction or saddle anesthesia. Went to ED and given valium, ibuprofen and tramadol which he's been taking. No radiographs done.  9/2: Patient reports he had been doing better following injection. However pain has worsened recently. Just started doing PT 2 weeks ago - lag in approval from workers comp - going to [employer] [employer]. Pain persistent, 10/10 since yesterday. Radiating down right leg and into right groin. Muscle relaxant, tramadol not helping. No bowel/bladder dysfunction.  07/07/14: Patient returns as workers comp would like him to have an updated evaluation - see prior notes for updates on his status beyond this. Continues to struggle with severe pain in low back 8/10 level. Has completed physical therapy, had 3 ESIs for L4-5 disc herniation without lasting benefit. Radiates into right leg and groin. No numbness/tingling currently. No bowel/bladder dysfunction.  Past Medical History  Diagnosis Date  . Asthma     Current Outpatient Prescriptions on File Prior to Visit  Medication Sig Dispense Refill  . diclofenac (VOLTAREN) 75 MG EC tablet Take 1 tablet (75 mg total) by mouth 2 (two) times daily. With food 60 tablet 1  . methocarbamol (ROBAXIN) 500 MG tablet Take 1 tablet (500 mg total) by mouth every 8 (eight) hours as needed for muscle spasms. 90 tablet 1  .  oxyCODONE-acetaminophen (PERCOCET/ROXICET) 5-325 MG per tablet Take 1 tablet by mouth every 6 (six) hours as needed for severe pain. 40 tablet 0  . predniSONE (STERAPRED UNI-PAK) 10 MG tablet 6 tabs po day 1, 5 tabs po day 2, 4 tabs po day 3, 3 tabs po day 4, 2 tabs po day 5, 1 tab po day 6 21 tablet 0  . traMADol (ULTRAM) 50 MG tablet Take 1 tablet (50 mg total) by mouth every 8 (eight) hours as needed. 90 tablet 0   No current facility-administered medications on file prior to visit.    No past surgical history on file.  Allergies  Allergen Reactions  . Penicillins Hives    History   Social History  . Marital Status: Married    Spouse Name: N/A    Number of Children: N/A  . Years of Education: N/A   Occupational History  . Not on file.   Social History Main Topics  . Smoking status: Current Every Day Smoker -- 0.50 packs/day    Types: Cigarettes  . Smokeless tobacco: Not on file  . Alcohol Use: 0.0 oz/week    0 Not specified per week  . Drug Use: No  . Sexual Activity: Not on file   Other Topics Concern  . Not on file   Social History Narrative    No family history on file.  BP 156/95 mmHg  Pulse 76  Ht 6\' 3"  (1.905 m)  Wt 235 lb (106.595 kg)  BMI 29.37 kg/m2  Review of Systems: See HPI above.    Objective:  Physical Exam:  Gen: NAD but appears uncomfortable.  Back: No gross deformity, scoliosis. TTP right lumbar paraspinal region.  No midline or bony TTP. ROM 30 degrees flexion, 10 degrees extension but both painful. Strength LEs 5/5 all muscle groups.   2+ MSRs in patellar and achilles tendons, equal bilaterally. Mild positive SLR on right, negative left. Sensation intact to light touch bilaterally. Negative logroll bilateral hips Negative fabers and piriformis stretches.    Assessment & Plan:  1. 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  Herniation due to accident at work.  Completed PT and ESI x 3 without lasting benefit.   Advised patient at this point we move forward with neurosurgery and pain management referrals which were placed previously and orders faxed to workers comp.

## 2014-07-11 NOTE — Assessment & Plan Note (Signed)
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  Herniation due to accident at work.  Completed PT and ESI x 3 without lasting benefit.  Advised patient at this point we move forward with neurosurgery and pain management referrals which were placed previously and orders faxed to workers comp.

## 2014-07-13 ENCOUNTER — Encounter: Payer: Self-pay | Admitting: Family Medicine

## 2014-08-22 ENCOUNTER — Telehealth: Payer: Self-pay | Admitting: *Deleted

## 2014-08-22 NOTE — Telephone Encounter (Signed)
Spoke with patient and he stated that he is unable to sleep at night due to back pain. Wanted to know if we could prescribe something for him.

## 2014-08-22 NOTE — Telephone Encounter (Signed)
We do not fill medications for sleep if that's what he's asking for.  Regarding pain medication as we discussed previously tramadol is the strongest we could prescribe and he has two referrals in for neurosurgery and pain management.

## 2014-08-22 NOTE — Telephone Encounter (Signed)
Spoke to patient and told him that we do not fill medications for sleep or anxiety. Has appointment with Neurosurgery.

## 2014-08-27 ENCOUNTER — Telehealth: Payer: Self-pay | Admitting: Family Medicine

## 2014-08-27 DIAGNOSIS — M5126 Other intervertebral disc displacement, lumbar region: Secondary | ICD-10-CM | POA: Insufficient documentation

## 2014-12-22 ENCOUNTER — Emergency Department (HOSPITAL_COMMUNITY): Payer: Worker's Compensation

## 2014-12-22 ENCOUNTER — Emergency Department (HOSPITAL_COMMUNITY)
Admission: EM | Admit: 2014-12-22 | Discharge: 2014-12-23 | Disposition: A | Discharge status: Home / Self Care | Payer: Worker's Compensation | Attending: Emergency Medicine | Admitting: Emergency Medicine

## 2014-12-22 ENCOUNTER — Encounter (HOSPITAL_COMMUNITY): Payer: Self-pay | Admitting: *Deleted

## 2014-12-22 DIAGNOSIS — R531 Weakness: Secondary | ICD-10-CM | POA: Insufficient documentation

## 2014-12-22 DIAGNOSIS — Z880 Allergy status to penicillin: Secondary | ICD-10-CM | POA: Insufficient documentation

## 2014-12-22 DIAGNOSIS — M545 Low back pain: Secondary | ICD-10-CM | POA: Diagnosis present

## 2014-12-22 DIAGNOSIS — R509 Fever, unspecified: Secondary | ICD-10-CM | POA: Insufficient documentation

## 2014-12-22 DIAGNOSIS — M5440 Lumbago with sciatica, unspecified side: Secondary | ICD-10-CM | POA: Diagnosis not present

## 2014-12-22 DIAGNOSIS — R05 Cough: Secondary | ICD-10-CM

## 2014-12-22 DIAGNOSIS — Z720 Tobacco use: Secondary | ICD-10-CM | POA: Diagnosis not present

## 2014-12-22 DIAGNOSIS — G8918 Other acute postprocedural pain: Secondary | ICD-10-CM | POA: Insufficient documentation

## 2014-12-22 DIAGNOSIS — J45909 Unspecified asthma, uncomplicated: Secondary | ICD-10-CM | POA: Diagnosis not present

## 2014-12-22 DIAGNOSIS — Z79899 Other long term (current) drug therapy: Secondary | ICD-10-CM | POA: Insufficient documentation

## 2014-12-22 DIAGNOSIS — K5900 Constipation, unspecified: Secondary | ICD-10-CM | POA: Insufficient documentation

## 2014-12-22 DIAGNOSIS — R059 Cough, unspecified: Secondary | ICD-10-CM

## 2014-12-22 LAB — CBC WITH DIFFERENTIAL/PLATELET
BASOS ABS: 0 10*3/uL (ref 0.0–0.1)
Basophils Relative: 0 % (ref 0–1)
Eosinophils Absolute: 0.2 10*3/uL (ref 0.0–0.7)
Eosinophils Relative: 3 % (ref 0–5)
HEMATOCRIT: 38.5 % — AB (ref 39.0–52.0)
Hemoglobin: 13.2 g/dL (ref 13.0–17.0)
LYMPHS ABS: 1.6 10*3/uL (ref 0.7–4.0)
Lymphocytes Relative: 21 % (ref 12–46)
MCH: 31.4 pg (ref 26.0–34.0)
MCHC: 34.3 g/dL (ref 30.0–36.0)
MCV: 91.7 fL (ref 78.0–100.0)
Monocytes Absolute: 0.5 10*3/uL (ref 0.1–1.0)
Monocytes Relative: 7 % (ref 3–12)
NEUTROS ABS: 5 10*3/uL (ref 1.7–7.7)
Neutrophils Relative %: 69 % (ref 43–77)
Platelets: 247 10*3/uL (ref 150–400)
RBC: 4.2 MIL/uL — AB (ref 4.22–5.81)
RDW: 13.5 % (ref 11.5–15.5)
WBC: 7.2 10*3/uL (ref 4.0–10.5)

## 2014-12-22 LAB — URINALYSIS, ROUTINE W REFLEX MICROSCOPIC
BILIRUBIN URINE: NEGATIVE
Glucose, UA: NEGATIVE mg/dL
Hgb urine dipstick: NEGATIVE
Ketones, ur: NEGATIVE mg/dL
Leukocytes, UA: NEGATIVE
Nitrite: NEGATIVE
PH: 7 (ref 5.0–8.0)
Protein, ur: NEGATIVE mg/dL
Specific Gravity, Urine: 1.016 (ref 1.005–1.030)
Urobilinogen, UA: 1 mg/dL (ref 0.0–1.0)

## 2014-12-22 LAB — BASIC METABOLIC PANEL
ANION GAP: 9 (ref 5–15)
BUN: 7 mg/dL (ref 6–20)
CALCIUM: 9.1 mg/dL (ref 8.9–10.3)
CO2: 28 mmol/L (ref 22–32)
Chloride: 98 mmol/L — ABNORMAL LOW (ref 101–111)
Creatinine, Ser: 0.92 mg/dL (ref 0.61–1.24)
GFR calc Af Amer: >60 mL/min (ref >60)
GFR calc non Af Amer: >60 mL/min (ref >60)
Glucose, Bld: 105 mg/dL — ABNORMAL HIGH (ref 65–99)
POTASSIUM: 3.8 mmol/L (ref 3.5–5.1)
SODIUM: 135 mmol/L (ref 135–145)

## 2014-12-22 MED ORDER — HYDROMORPHONE HCL 1 MG/ML IJ SOLN
1.0000 mg | Freq: Once | INTRAMUSCULAR | Status: AC
  Administered 2014-12-22: 1 mg via INTRAVENOUS
  Filled 2014-12-22: qty 1

## 2014-12-22 MED ORDER — KETOROLAC TROMETHAMINE 30 MG/ML IJ SOLN
30.0000 mg | Freq: Once | INTRAMUSCULAR | Status: AC
Start: 2014-12-22 — End: 2014-12-22
  Administered 2014-12-22: 30 mg via INTRAVENOUS
  Filled 2014-12-22: qty 1

## 2014-12-22 MED ORDER — OXYCODONE-ACETAMINOPHEN 10-325 MG PO TABS: 1.0000 | ORAL_TABLET | ORAL | Status: DC | PRN

## 2014-12-22 MED ORDER — HYDROMORPHONE HCL 1 MG/ML IJ SOLN
1.0000 mg | Freq: Once | INTRAMUSCULAR | Status: AC
Start: 2014-12-22 — End: 2014-12-22
  Administered 2014-12-22: 1 mg via INTRAVENOUS
  Filled 2014-12-22: qty 1

## 2014-12-22 MED ORDER — DIAZEPAM 5 MG PO TABS
5.0000 mg | ORAL_TABLET | Freq: Once | ORAL | Status: AC
  Administered 2014-12-22: 5 mg via ORAL
  Filled 2014-12-22: qty 1

## 2014-12-22 MED ORDER — DIAZEPAM 5 MG PO TABS: 5.0000 mg | ORAL_TABLET | Freq: Four times a day (QID) | ORAL | Status: DC | PRN

## 2014-12-22 MED ORDER — POLYETHYLENE GLYCOL 3350 17 G PO PACK: 17.0000 g | PACK | Freq: Every day | ORAL | Status: DC

## 2014-12-22 NOTE — ED Notes (Signed)
The pt cannot walk before he has his pain med then he gets up

## 2014-12-22 NOTE — ED Provider Notes (Signed)
CSN: [id number]     Arrival date & time 12/22/14  1758 History   First MD Initiated Contact with Patient 12/22/14 1807     Chief Complaint  Patient presents with  . Back Pain     (Consider location/radiation/quality/duration/timing/severity/associated sxs/prior Treatment) Patient is a 54 y.o. male presenting with back pain. The history is provided by the patient.  Back Pain Location:  Lumbar spine Quality:  Aching Radiates to:  L foot and R foot Pain severity:  Moderate Pain is:  Same all the time Onset quality:  Sudden Duration:  4 days Timing:  Constant Progression:  Unchanged Chronicity:  New Context comment:  Recent low back surgery Relieved by:  Nothing Worsened by:  Ambulation and movement Ineffective treatments: percocet. Associated symptoms: fever and weakness (generalized)   Associated symptoms: no abdominal pain, no chest pain, no dysuria, no headaches and no numbness     Past Medical History  Diagnosis Date  . Asthma    History reviewed. No pertinent past surgical history. No family history on file. History  Substance Use Topics  . Smoking status: Current Every Day Smoker -- 0.50 packs/day    Types: Cigarettes  . Smokeless tobacco: Not on file  . Alcohol Use: 0.0 oz/week    0 Standard drinks or equivalent per week    Review of Systems  Constitutional: Positive for fever.  HENT: Negative for drooling and rhinorrhea.   Eyes: Negative for pain.  Respiratory: Positive for cough. Negative for shortness of breath.   Cardiovascular: Negative for chest pain and leg swelling.  Gastrointestinal: Positive for constipation. Negative for nausea, vomiting, abdominal pain and diarrhea.  Genitourinary: Positive for decreased urine volume. Negative for dysuria and hematuria.  Musculoskeletal: Positive for back pain. Negative for gait problem and neck pain.  Skin: Negative for color change.  Neurological: Positive for weakness (generalized). Negative for numbness and  headaches.  Hematological: Negative for adenopathy.  Psychiatric/Behavioral: Negative for behavioral problems.  All other systems reviewed and are negative.     Allergies  Penicillins  Home Medications   Prior to Admission medications   Medication Sig Start Date End Date Taking? Authorizing Provider  diclofenac (VOLTAREN) 75 MG EC tablet Take 1 tablet (75 mg total) by mouth 2 (two) times daily. With food 01/23/14   Dene Gentry, MD  methocarbamol (ROBAXIN) 500 MG tablet Take 1 tablet (500 mg total) by mouth every 8 (eight) hours as needed for muscle spasms. 01/23/14   Dene Gentry, MD  oxyCODONE-acetaminophen (PERCOCET/ROXICET) 5-325 MG per tablet Take 1 tablet by mouth every 6 (six) hours as needed for severe pain. 02/28/14   Dene Gentry, MD  predniSONE (STERAPRED UNI-PAK) 10 MG tablet 6 tabs po day 1, 5 tabs po day 2, 4 tabs po day 3, 3 tabs po day 4, 2 tabs po day 5, 1 tab po day 6 02/28/14   Dene Gentry, MD  traMADol (ULTRAM) 50 MG tablet Take 1 tablet (50 mg total) by mouth every 8 (eight) hours as needed. 06/14/14   Dene Gentry, MD   BP 153/88 mmHg  Pulse 87  Temp(Src) 98.3 F (36.8 C) (Oral)  Resp 16  Ht 6\' 3"  (1.905 m)  Wt 250 lb (113.399 kg)  BMI 31.25 kg/m2  SpO2 98% Physical Exam  Constitutional: He is oriented to [place], place, and time. He appears well-developed and well-nourished.  HENT:  Head: Normocephalic and atraumatic.  Right Ear: External ear normal.  Left Ear: External ear normal.  Nose: Nose normal.  Mouth/Throat: Oropharynx is clear and moist. No oropharyngeal exudate.  Eyes: Conjunctivae and EOM are normal. Pupils are equal, round, and reactive to light.  Neck: Normal range of motion. Neck supple.  Cardiovascular: Normal rate, regular rhythm, normal heart sounds and intact distal pulses.  Exam reveals no gallop and no friction rub.   No murmur heard. Pulmonary/Chest: Effort normal and breath sounds normal. No respiratory distress. He has  no wheezes.  Abdominal: Soft. Bowel sounds are normal. He exhibits no distension. There is no tenderness. There is no rebound and no guarding.  Musculoskeletal: Normal range of motion. He exhibits no edema or tenderness.  Surgical wound on the lower back appears clean, dry, intact. No significant inflammation or swelling. Wound is mildly tender to palpation. No drainage noted with palpation.  4/ 5 strength in bilateral lower extremities. 2+ distal pulses in the lower extremity's. Sensation intact in the lower extremities.  Neurological: He is alert and oriented to [place], place, and time. He displays no Babinski's sign on the right side. He displays no Babinski's sign on the left side.  Reflex Scores:      Patellar reflexes are 1+ on the right side and 1+ on the left side.      Achilles reflexes are 1+ on the right side and 1+ on the left side. Skin: Skin is warm and dry.  Psychiatric: He has a normal mood and affect. His behavior is normal.  Nursing note and vitals reviewed.   ED Course  Procedures (including critical care time) Labs Review Labs Reviewed  CBC WITH DIFFERENTIAL/PLATELET - Abnormal; Notable for the following:    RBC 4.20 (*)    HCT 38.5 (*)    All other components within normal limits  BASIC METABOLIC PANEL - Abnormal; Notable for the following:    Chloride 98 (*)    Glucose, Bld 105 (*)    All other components within normal limits  URINALYSIS, ROUTINE W REFLEX MICROSCOPIC (NOT AT [facility])    Imaging Review Dg Chest 2 View  12/22/2014   CLINICAL DATA:  Low back spasms. Recent back surgery several days ago.  EXAM: CHEST  2 VIEW  COMPARISON:  None  FINDINGS: Airway thickening is present, suggesting bronchitis or reactive airways disease. No airspace opacity identified.  Cardiac and mediastinal margins appear normal.  No pleural effusion.  IMPRESSION: 1. Airway thickening is present, suggesting bronchitis or reactive airways disease.   Electronically Signed   By: Van Clines M.D.   On: 12/22/2014 19:07     EKG Interpretation None      MDM   Final diagnoses:  Cough  Midline low back pain with sciatica, sciatica laterality unspecified    6:36 PM 54 y.o. 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 He states that he has had low-grade fevers at home, generalized weakness, constipation, and weak stream when urinating. He is afebrile and his vital signs are unremarkable here. He notes ongoing low back aching with shooting pains down the posterior aspect of both his legs to his feet. He denies any new numbness or lower extremity weakness. His surgical site looks clean, dry, intact, without evidence of infection or purulent drainage. He also notes productive cough of green sputum. We'll get screening lab work, chest x-ray, urinalysis, postvoid residual, and pain control.  The patient has been taking 1-2 Percocet every 2 hours without  significant relief. He does note pain relief for about 45 minutes but states that the pain returns.  Post void residual performed and minimal, documented in nursing notes.  Case discussed w/ Dr. Vertell Limber (on call for [facility]). He agrees we can maximize pain control and utilize muscle relaxants to help him.   10:41 PM: Pt has been ambulatory here. Pain improved. Will inc to percocet 10-325 and valium for muscle spasm. Home w/ bedside urinal and Rx for a walker. Will add miralax to bowel regimen.  I have discussed the diagnosis/risks/treatment options with the patient and family and believe the pt to be eligible for discharge home to follow-up with his neurosurgeon next week. We also discussed returning to the ED immediately if new or worsening sx occur. We discussed the sx which are most concerning (e.g., worsening pain, weakness, numbness, fever) that necessitate immediate return. Medications administered to the patient during their visit and any  new prescriptions provided to the patient are listed below.  Medications given during this visit Medications  HYDROmorphone (DILAUDID) injection 1 mg (not administered)  HYDROmorphone (DILAUDID) injection 1 mg (1 mg Intravenous Given 12/22/14 1915)  HYDROmorphone (DILAUDID) injection 1 mg (1 mg Intravenous Given 12/22/14 2008)  ketorolac (TORADOL) 30 MG/ML injection 30 mg (30 mg Intravenous Given 12/22/14 2008)  HYDROmorphone (DILAUDID) injection 1 mg (1 mg Intravenous Given 12/22/14 2156)  diazepam (VALIUM) tablet 5 mg (5 mg Oral Given 12/22/14 2156)    Discharge Medication List as of 12/22/2014 11:00 PM    START taking these medications   Details  diazepam (VALIUM) 5 MG tablet Take 1 tablet (5 mg total) by mouth every 6 (six) hours as needed for muscle spasms., Starting 12/22/2014, Until Discontinued, Print    oxyCODONE-acetaminophen (PERCOCET) 10-325 MG per tablet Take 1 tablet by mouth every 4 (four) hours as needed for pain., Starting 12/22/2014, Until Discontinued, Print    polyethylene glycol (MIRALAX) packet Take 17 g by mouth daily., Starting 12/22/2014, Until Discontinued, Print         Pamella Pert, MD 12/23/14 1120

## 2014-12-22 NOTE — ED Notes (Signed)
The pt is c/o lower back spasms.  He had had back surgery on Wednesday and every day his pain has increased especially today.  He has been taking a percocet every 2 hours per his doctors orders.

## 2014-12-22 NOTE — ED Notes (Addendum)
RN in room to bladder scan, pt writhing around in bed, unable to keep still d/t pain. RN explains procedure of bladder scan, pt then resting quietly without movement. Reports pain 20/10. MD notified.

## 2014-12-22 NOTE — Discharge Instructions (Signed)
Back Pain, Adult °Back pain is very common. The pain often gets better over time. The cause of back pain is usually not dangerous. Most people can learn to manage their back pain on their own.  °HOME CARE  °· Stay active. Start with short walks on flat ground if you can. Try to walk farther each day. °· Do not sit, drive, or stand in one place for more than 30 minutes. Do not stay in bed. °· Do not avoid exercise or work. Activity can help your back heal faster. °· Be careful when you bend or lift an object. Bend at your knees, keep the object close to you, and do not twist. °· Sleep on a firm mattress. Lie on your side, and bend your knees. If you lie on your back, put a pillow under your knees. °· Only take medicines as told by your doctor. °· Put ice on the injured area. °¨ Put ice in a plastic bag. °¨ Place a towel between your skin and the bag. °¨ Leave the ice on for 15-20 minutes, 03-04 times a day for the first 2 to 3 days. After that, you can switch between ice and heat packs. °· Ask your doctor about back exercises or massage. °· Avoid feeling anxious or stressed. Find good ways to deal with stress, such as exercise. °GET HELP RIGHT AWAY IF:  °· Your pain does not go away with rest or medicine. °· Your pain does not go away in 1 week. °· You have new problems. °· You do not feel well. °· The pain spreads into your legs. °· You cannot control when you poop (bowel movement) or pee (urinate). °· Your arms or legs feel weak or lose feeling (numbness). °· You feel sick to your stomach (nauseous) or throw up (vomit). °· You have belly (abdominal) pain. °· You feel like you may pass out (faint). °MAKE SURE YOU:  °· Understand these instructions. °· Will watch your condition. °· Will get help right away if you are not doing well or get worse. °Document Released: 12/02/2007 Document Revised: 09/07/2011 Document Reviewed: 10/17/2013 °ExitCare® Patient Information ©2015 [employer]. This information is not intended  to replace advice given to you by your health care provider. Make sure you discuss any questions you have with your health care provider. ° °

## 2014-12-22 NOTE — ED Notes (Signed)
MD Harrison at bedside. 

## 2014-12-22 NOTE — ED Notes (Signed)
MD at bedside. 

## 2015-03-14 ENCOUNTER — Encounter (HOSPITAL_COMMUNITY): Payer: Self-pay

## 2015-03-14 ENCOUNTER — Emergency Department (HOSPITAL_COMMUNITY)
Admission: EM | Admit: 2015-03-14 | Discharge: 2015-03-14 | Disposition: A | Discharge status: Home / Self Care | Payer: Self-pay | Attending: Emergency Medicine | Admitting: Emergency Medicine

## 2015-03-14 DIAGNOSIS — Z880 Allergy status to penicillin: Secondary | ICD-10-CM | POA: Insufficient documentation

## 2015-03-14 DIAGNOSIS — Y998 Other external cause status: Secondary | ICD-10-CM | POA: Insufficient documentation

## 2015-03-14 DIAGNOSIS — J45909 Unspecified asthma, uncomplicated: Secondary | ICD-10-CM | POA: Insufficient documentation

## 2015-03-14 DIAGNOSIS — Y9289 Other specified places as the place of occurrence of the external cause: Secondary | ICD-10-CM | POA: Insufficient documentation

## 2015-03-14 DIAGNOSIS — S6992XA Unspecified injury of left wrist, hand and finger(s), initial encounter: Secondary | ICD-10-CM

## 2015-03-14 DIAGNOSIS — Z720 Tobacco use: Secondary | ICD-10-CM | POA: Insufficient documentation

## 2015-03-14 DIAGNOSIS — Y288XXA Contact with other sharp object, undetermined intent, initial encounter: Secondary | ICD-10-CM | POA: Insufficient documentation

## 2015-03-14 DIAGNOSIS — Y9389 Activity, other specified: Secondary | ICD-10-CM | POA: Insufficient documentation

## 2015-03-14 DIAGNOSIS — Z79899 Other long term (current) drug therapy: Secondary | ICD-10-CM | POA: Insufficient documentation

## 2015-03-14 DIAGNOSIS — S61042A Puncture wound with foreign body of left thumb without damage to nail, initial encounter: Secondary | ICD-10-CM | POA: Insufficient documentation

## 2015-03-14 MED ORDER — BUPIVACAINE HCL (PF) 0.5 % IJ SOLN
50.0000 mL | Freq: Once | INTRAMUSCULAR | Status: AC
  Administered 2015-03-14: 50 mL
  Filled 2015-03-14: qty 50

## 2015-03-14 MED ORDER — CEPHALEXIN 500 MG PO CAPS: 1000.0000 mg | ORAL_CAPSULE | Freq: Two times a day (BID) | ORAL | Status: DC

## 2015-03-14 NOTE — ED Notes (Signed)
Pt arrived with a fishing hook in left thumb

## 2015-03-14 NOTE — ED Notes (Signed)
Pt stable, ambulatory, states understanding of discharge instructions 

## 2015-03-14 NOTE — ED Provider Notes (Signed)
CSN: [id number]     Arrival date & time 03/14/15  1756 History  This chart was scribed for Dalia Heading, PA-C, working with Forde Dandy, MD by Starleen Arms, ED Scribe. This patient was seen in room TR09C/TR09C and the patient's care was started at 6:16 PM.   No chief complaint on file.  The history is provided by the patient. No language interpreter was used.    HPI Comments: Todd Ryan is a 54 y.o. male who presents to the Emergency Department complaining of moderately painful fishing hook stuck in the left hand thumb onset 5 hours ago.  The patient reports he was hooked while trying to remove a separate hook from a fish.  He has tried to remove the hook without success.  He denies other complaints.     Past Medical History  Diagnosis Date  . Asthma    No past surgical history on file. No family history on file. Social History  Substance Use Topics  . Smoking status: Current Every Day Smoker -- 0.50 packs/day    Types: Cigarettes  . Smokeless tobacco: Not on file  . Alcohol Use: 0.0 oz/week    0 Standard drinks or equivalent per week    Review of Systems A complete 10 system review of systems was obtained and all systems are negative except as noted in the HPI and PMH.   Allergies  Shellfish allergy and Penicillins  Home Medications   Prior to Admission medications   Medication Sig Start Date End Date Taking? Authorizing Provider  albuterol (PROVENTIL HFA;VENTOLIN HFA) 108 (90 BASE) MCG/ACT inhaler Inhale 2 puffs into the lungs daily as needed for wheezing or shortness of breath.    Historical Provider, MD  diazepam (VALIUM) 5 MG tablet Take 1 tablet (5 mg total) by mouth every 6 (six) hours as needed for muscle spasms. 12/22/14   Pamella Pert, MD  diclofenac (VOLTAREN) 75 MG EC tablet Take 1 tablet (75 mg total) by mouth 2 (two) times daily. With food 01/23/14   Dene Gentry, MD  methocarbamol (ROBAXIN) 500 MG tablet Take 1 tablet (500 mg total) by mouth  every 8 (eight) hours as needed for muscle spasms. 01/23/14   Dene Gentry, MD  oxyCODONE-acetaminophen (PERCOCET) 10-325 MG per tablet Take 1 tablet by mouth every 4 (four) hours as needed for pain. 12/22/14   Pamella Pert, MD  polyethylene glycol [facility]) packet Take 17 g by mouth daily. 12/22/14   Pamella Pert, MD  predniSONE (STERAPRED UNI-PAK) 10 MG tablet 6 tabs po day 1, 5 tabs po day 2, 4 tabs po day 3, 3 tabs po day 4, 2 tabs po day 5, 1 tab po day 6 Patient not taking: Reported on 12/22/2014 02/28/14   Dene Gentry, MD  traMADol (ULTRAM) 50 MG tablet Take 1 tablet (50 mg total) by mouth every 8 (eight) hours as needed. Patient not taking: Reported on 12/22/2014 06/14/14   Dene Gentry, MD   There were no vitals taken for this visit. Physical Exam  Constitutional: He is oriented to [place], place, and time. He appears well-developed and well-nourished. No distress.  HENT:  Head: Normocephalic and atraumatic.  Eyes: Conjunctivae and EOM are normal.  Neck: Neck supple. No tracheal deviation present.  Cardiovascular: Normal rate.   Pulmonary/Chest: Effort normal. No respiratory distress.  Musculoskeletal: Normal range of motion.  Neurological: He is alert and oriented to [place], place, and time.  Skin: Skin is warm and dry.  Fish  hook in left thumb.   Psychiatric: He has a normal mood and affect. His behavior is normal.  Nursing note and vitals reviewed.   ED Course  FOREIGN BODY REMOVAL Date/Time: 03/14/2015 7:30 PM Performed by: Dalia Heading Authorized by: Dalia Heading Consent: Verbal consent obtained. Written consent not obtained. Risks and benefits: risks, benefits and alternatives were discussed Consent given by: patient Patient understanding: patient does not state understanding of the procedure being performed Required items: required blood products, implants, devices, and special equipment available Patient identity confirmed: verbally with  patient Time out: Immediately prior to procedure a "time out" was called to verify the correct patient, procedure, equipment, support staff and site/side marked as required. Body area: skin General location: upper extremity Location details: left thumb Anesthesia: local infiltration Local anesthetic: bupivacaine 0.5% without epinephrine Patient sedated: no Patient restrained: no Patient cooperative: yes Removal mechanism: hemostat and forceps Tendon involvement: none Depth: subcutaneous Complexity: complex 1 objects recovered. Objects recovered: 1 Post-procedure assessment: foreign body removed Patient tolerance: Patient tolerated the procedure well with no immediate complications   (including critical care time)  DIAGNOSTIC STUDIES: Oxygen Saturation is 100% on RA, normal by my interpretation.    COORDINATION OF CARE:  7:20 PM Discussed plan to remove hook. Patient acknowledges and agrees with plan.    Labs Review Labs Reviewed - No data to display  I personally performed the services described in this documentation, which was scribed in my presence. The recorded information has been reviewed and is accurate.  Patient be placed on anti-buttocks.  Return here as needed.  Advised to keep the area clean  Dalia Heading, PA-C 03/14/15 [place] Liu, MD 03/15/15 [phone]

## 2015-03-14 NOTE — Discharge Instructions (Signed)
Return here as needed. Keep the area clean and dry °

## 2015-04-04 NOTE — Telephone Encounter (Signed)
Finished

## 2015-06-03 DIAGNOSIS — T819XXA Unspecified complication of procedure, initial encounter: Secondary | ICD-10-CM | POA: Insufficient documentation

## 2015-09-20 ENCOUNTER — Telehealth: Payer: Self-pay | Admitting: Family Medicine

## 2015-09-20 ENCOUNTER — Ambulatory Visit: Payer: Worker's Compensation

## 2015-09-20 NOTE — Telephone Encounter (Signed)
If his blood pressure was that elevated that they want him to be seen for this he would have to see a family physician and get fully evaluated.  This may include bloodwork, other testing.  These are not things we do here in the office.  He will have to see his family physician or establish with one and get checked out first.

## 2015-09-20 NOTE — Telephone Encounter (Signed)
Went to [facility] and they said he needs an FCE test.  His blood pressure was up to high for it because he had taken some cold medication.  Now they need a note/letter from you (after we take his BP again) that it is okay for him to get this FCE test done.  Should he make an appointment here? He can come today if it's okay.

## 2015-09-20 NOTE — Telephone Encounter (Signed)
Spoke to patient and gave him the information provided.

## 2015-11-29 IMAGING — CR DG CHEST 2V
2 series · 2 of 2 positions shown · non-contrast
Comparison: None

CLINICAL DATA: Low back spasms. Recent back surgery several days
ago.

EXAM:
CHEST  2 VIEW

[w chest pa]
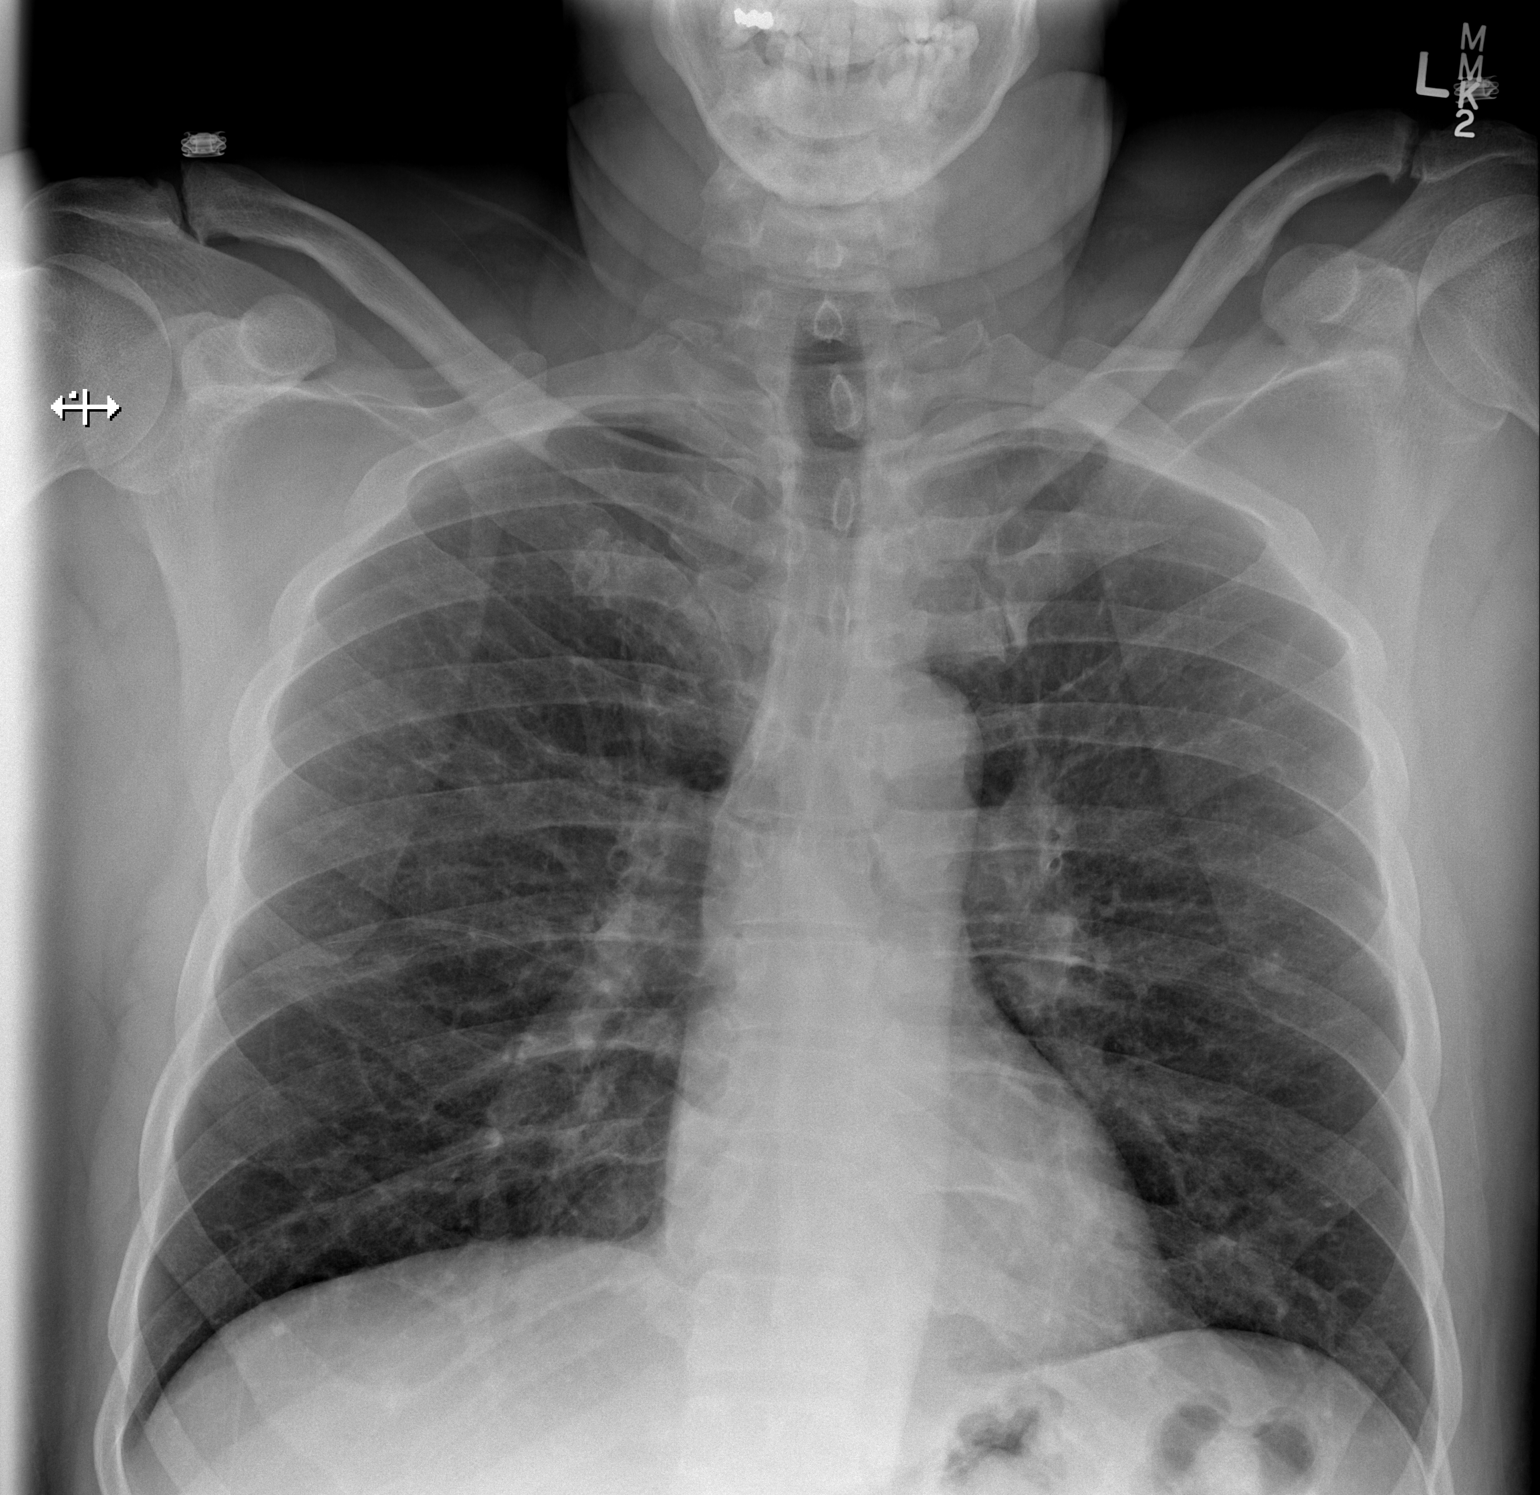

[w chest lat]
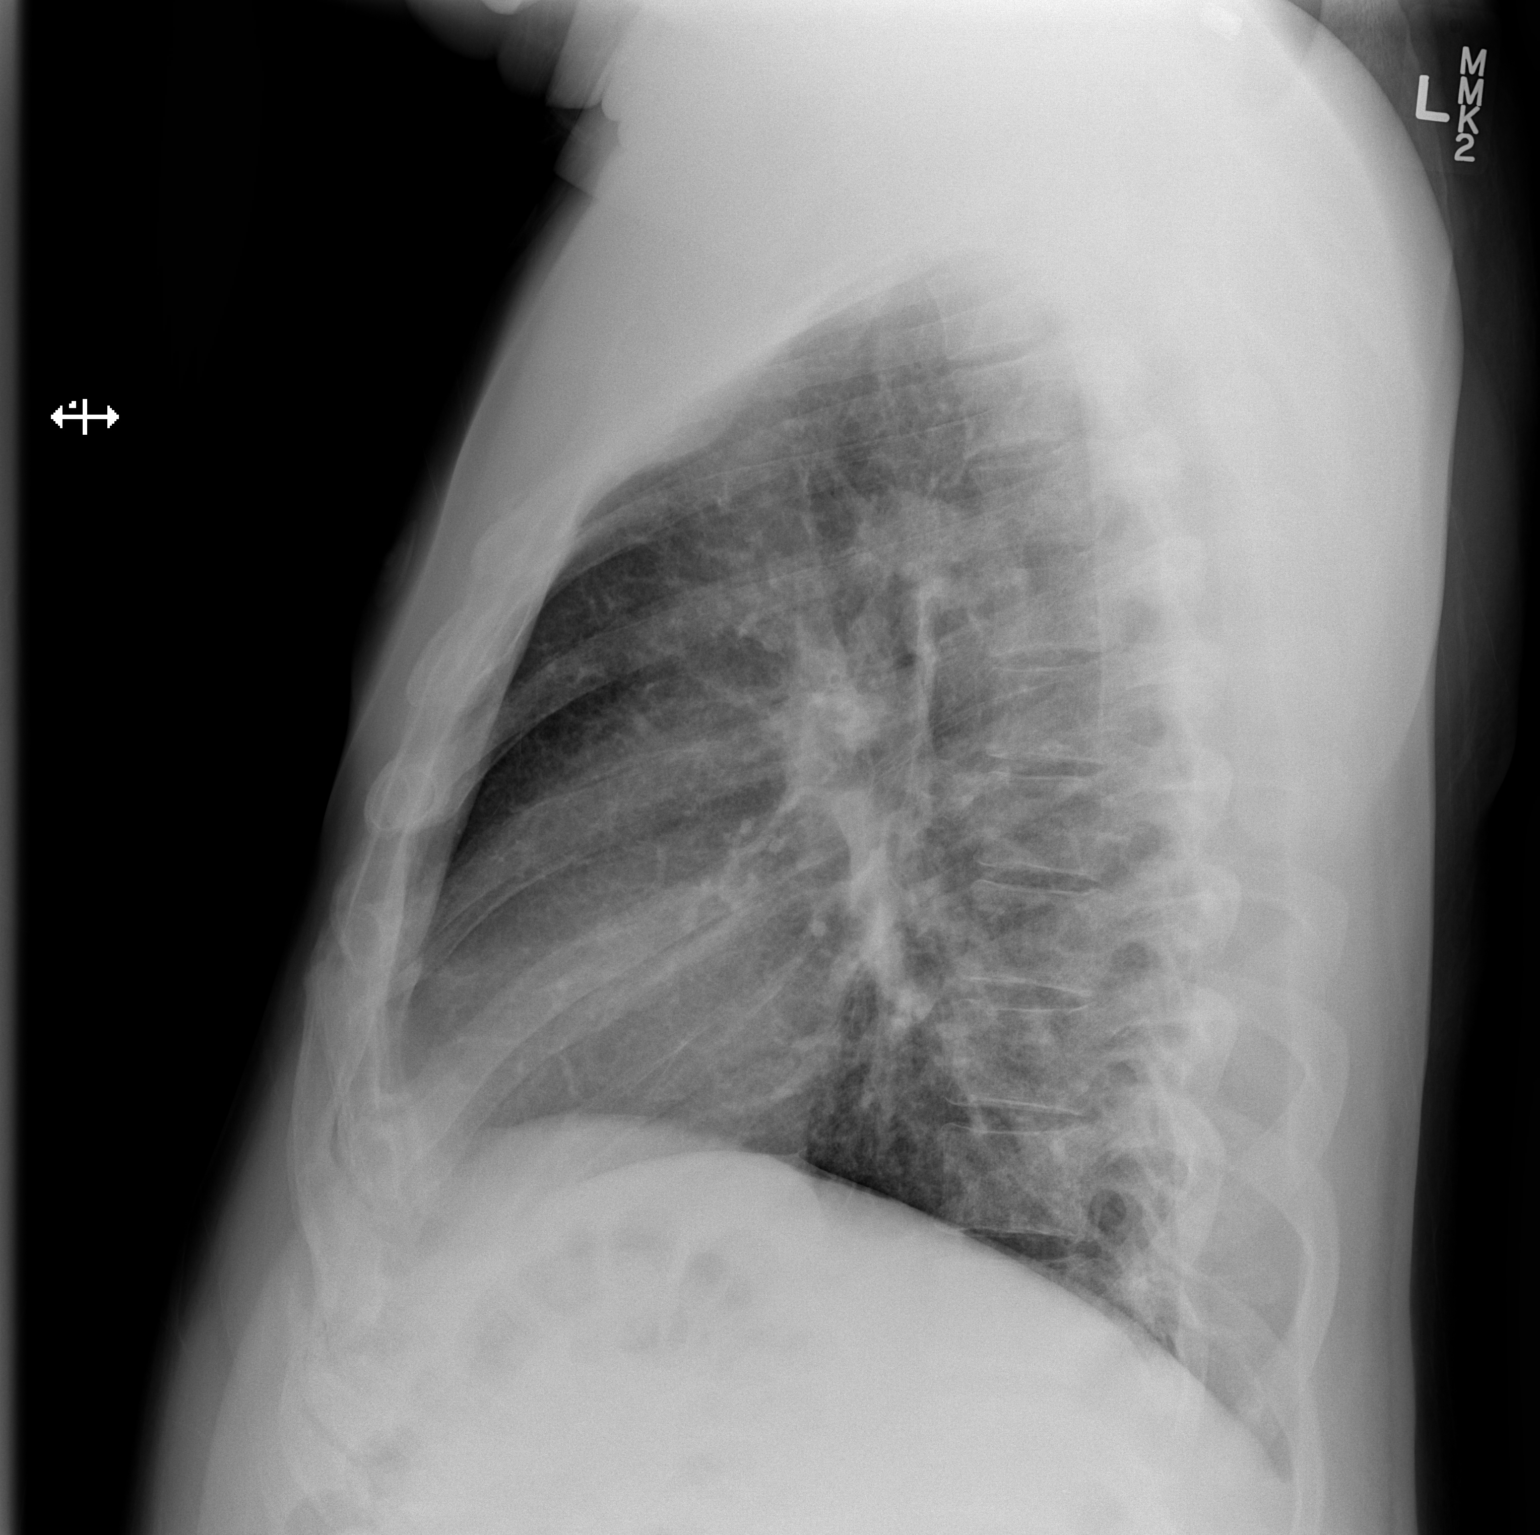

[2 of 2 positions shown; findings below may reference images not displayed]

FINDINGS: Airway thickening is present, suggesting bronchitis or reactive
airways disease. No airspace opacity identified.

Cardiac and mediastinal margins appear normal.  No pleural effusion.
IMPRESSION: 1. Airway thickening is present, suggesting bronchitis or reactive
airways disease.

## 2016-06-20 ENCOUNTER — Encounter (HOSPITAL_COMMUNITY): Payer: Self-pay | Admitting: Emergency Medicine

## 2016-06-20 ENCOUNTER — Ambulatory Visit (HOSPITAL_COMMUNITY)
Admission: EM | Admit: 2016-06-20 | Discharge: 2016-06-20 | Disposition: A | Discharge status: Home / Self Care | Payer: Self-pay | Attending: Emergency Medicine | Admitting: Emergency Medicine

## 2016-06-20 ENCOUNTER — Ambulatory Visit (INDEPENDENT_AMBULATORY_CARE_PROVIDER_SITE_OTHER): Payer: Self-pay

## 2016-06-20 DIAGNOSIS — R05 Cough: Secondary | ICD-10-CM

## 2016-06-20 DIAGNOSIS — R0789 Other chest pain: Secondary | ICD-10-CM

## 2016-06-20 DIAGNOSIS — R059 Cough, unspecified: Secondary | ICD-10-CM

## 2016-06-20 MED ORDER — PREDNISONE 50 MG PO TABS
ORAL_TABLET | ORAL | 0 refills | Status: DC
Start: 2016-06-20 — End: 2018-06-02

## 2016-06-20 NOTE — Discharge Instructions (Signed)
Your EKG and chest x-ray are normal. The pain you're having is likely coming from a strained muscle from the cough. Take prednisone daily for 5 days. Follow-up as needed.

## 2016-06-20 NOTE — ED Provider Notes (Addendum)
[facility]    CSN: [id number] Arrival date & time: 06/20/16  1227     History   Chief Complaint Chief Complaint  Patient presents with  . Chest Pain    HPI Todd Ryan is a 55 y.o. male.   HPI  He is a 55 year old man here for evaluation of chest and back pain. Last night and this morning, he reports pain in his back and left anterior chest. He describes it as a pressure type sensation. It is worse with deep breaths. He also reports feeling palpitations and heart flutters. He has also has some shortness of breath, but reports a cough for the last month. No known fevers. No nasal congestion or rhinorrhea. No nausea or vomiting.  Past Medical History:  Diagnosis Date  . Asthma     Patient Active Problem List   Diagnosis Date Noted  . Lower back injury 12/07/2013    History reviewed. No pertinent surgical history.     Home Medications    Prior to Admission medications   Medication Sig Start Date End Date Taking? Authorizing Provider  meloxicam (MOBIC) 15 MG tablet Take 15 mg by mouth daily.   Yes Historical Provider, MD  oxyCODONE-acetaminophen (PERCOCET) 10-325 MG per tablet Take 1 tablet by mouth every 4 (four) hours as needed for pain. 12/22/14  Yes Pamella Pert, MD  albuterol (PROVENTIL HFA;VENTOLIN HFA) 108 (90 BASE) MCG/ACT inhaler Inhale 2 puffs into the lungs daily as needed for wheezing or shortness of breath.    Historical Provider, MD  diazepam (VALIUM) 5 MG tablet Take 1 tablet (5 mg total) by mouth every 6 (six) hours as needed for muscle spasms. 12/22/14   Pamella Pert, MD  diclofenac (VOLTAREN) 75 MG EC tablet Take 1 tablet (75 mg total) by mouth 2 (two) times daily. With food 01/23/14   Dene Gentry, MD  methocarbamol (ROBAXIN) 500 MG tablet Take 1 tablet (500 mg total) by mouth every 8 (eight) hours as needed for muscle spasms. 01/23/14   Dene Gentry, MD  polyethylene glycol (MIRALAX) packet Take 17 g by mouth daily. 12/22/14    Pamella Pert, MD  predniSONE (DELTASONE) 50 MG tablet Take 1 pill daily for 5 days. 06/20/16   Melony Overly, MD    Family History History reviewed. No pertinent family history.  Social History Social History  Substance Use Topics  . Smoking status: Current Every Day Smoker    Packs/day: 0.50    Types: Cigarettes  . Smokeless tobacco: Not on file  . Alcohol use 0.0 oz/week     Allergies   Shellfish allergy and Penicillins   Review of Systems Review of Systems As in history of present illness  Physical Exam Triage Vital Signs ED Triage Vitals  Enc Vitals Group     BP 06/20/16 1422 135/96     Pulse Rate 06/20/16 1422 74     Resp 06/20/16 1422 16     Temp 06/20/16 1422 98.3 F (36.8 C)     Temp Source 06/20/16 1422 Oral     SpO2 06/20/16 1422 99 %     Weight --      Height --      Head Circumference --      Peak Flow --      Pain Score 06/20/16 1429 5     Pain Loc --      Pain Edu? --      Excl. in GC? --    No  data found.   Updated Vital Signs BP 135/96 (BP Location: Right Arm)   Pulse 74   Temp 98.3 F (36.8 C) (Oral)   Resp 16   SpO2 99%   Visual Acuity Right Eye Distance:   Left Eye Distance:   Bilateral Distance:    Right Eye Near:   Left Eye Near:    Bilateral Near:     Physical Exam  Constitutional: He is oriented to [place], place, and time. He appears well-developed and well-nourished. No distress.  HENT:  Nose: Nose normal.  Mouth/Throat: No oropharyngeal exudate.  TMs normal bilaterally  Neck: Neck supple.  Cardiovascular: Normal rate, regular rhythm and normal heart sounds.   No murmur heard. Pulmonary/Chest: Effort normal and breath sounds normal. No respiratory distress. He has no wheezes. He has no rales.     He exhibits no tenderness.    Lymphadenopathy:    He has no cervical adenopathy.  Neurological: He is alert and oriented to [place], place, and time.     UC Treatments / Results  Labs (all labs ordered are  listed, but only abnormal results are displayed) Labs Reviewed - No data to display  EKG  EKG Interpretation None       Radiology Dg Chest 2 View  Result Date: 06/20/2016 CLINICAL DATA:  Chest and back pain for 2 days. EXAM: CHEST  2 VIEW COMPARISON:  12/22/2014 FINDINGS: Costophrenic angles are incompletely evaluated on the PA view. Otherwise, both lungs are clear. No pleural effusion. Heart and mediastinum are within normal limits. The trachea is midline. No acute bone abnormality. IMPRESSION: No active cardiopulmonary disease. Electronically Signed   By: Markus Daft M.D.   On: 06/20/2016 15:17   Review of chest x-ray on Canopy shows 2 images with incomplete visualization of phrenic angles. A third image was done and reviewed on the x-ray machine showing the phrenic angles and there is no effusion. Is unclear why this image did not transfer to canopy.  Procedures ED EKG Date/Time: 06/20/2016 2:55 PM Performed by: Melony Overly Authorized by: Melony Overly   ECG reviewed by ED Physician in the absence of a cardiologist: yes   Previous ECG:    Previous ECG:  Unavailable Interpretation:    Interpretation: non-specific   Rate:    ECG rate:  70   ECG rate assessment: normal   Rhythm:    Rhythm: sinus rhythm   Ectopy:    Ectopy: none   QRS:    QRS axis:  Normal   QRS intervals:  Normal Conduction:    Conduction: normal   ST segments:    ST segments:  Normal T waves:    T waves: non-specific   Comments:     NSR, non-specific t-wave changes   (including critical care time)  Medications Ordered in UC Medications - No data to display   Initial Impression / Assessment and Plan / UC Course  I have reviewed the triage vital signs and the nursing notes.  Pertinent labs & imaging results that were available during my care of the patient were reviewed by me and considered in my medical decision making (see chart for details).  Clinical Course     EKG and chest x-ray are  normal. We'll treat with 5 days of prednisone for cough and muscle pain. Follow-up as needed.  Final Clinical Impressions(s) / UC Diagnoses   Final diagnoses:  Atypical chest pain  Cough    New Prescriptions New Prescriptions   PREDNISONE (DELTASONE)  50 MG TABLET    Take 1 pill daily for 5 days.     Melony Overly, MD 06/20/16 [place], MD 06/20/16 [phone]

## 2016-06-20 NOTE — ED Triage Notes (Signed)
The patient presented to the [facility] with a complaint of chest pressure and tightness that started this am. The patient described the pain as a pressure 5/10 that at times radiates into his back but is not currently. The patient also reported nausea and lightheadedness.

## 2018-06-02 ENCOUNTER — Observation Stay (HOSPITAL_COMMUNITY): Payer: Self-pay

## 2018-06-02 ENCOUNTER — Observation Stay (HOSPITAL_COMMUNITY)
Admission: EM | Admit: 2018-06-02 | Discharge: 2018-06-02 | Disposition: A | Discharge status: Home / Self Care | Payer: Self-pay | Attending: Internal Medicine | Admitting: Internal Medicine

## 2018-06-02 ENCOUNTER — Emergency Department (HOSPITAL_COMMUNITY): Payer: Self-pay

## 2018-06-02 ENCOUNTER — Encounter (HOSPITAL_COMMUNITY): Payer: Self-pay

## 2018-06-02 DIAGNOSIS — G459 Transient cerebral ischemic attack, unspecified: Secondary | ICD-10-CM | POA: Diagnosis present

## 2018-06-02 DIAGNOSIS — R0789 Other chest pain: Secondary | ICD-10-CM | POA: Insufficient documentation

## 2018-06-02 DIAGNOSIS — R278 Other lack of coordination: Secondary | ICD-10-CM

## 2018-06-02 DIAGNOSIS — Z91013 Allergy to seafood: Secondary | ICD-10-CM

## 2018-06-02 DIAGNOSIS — Z87891 Personal history of nicotine dependence: Secondary | ICD-10-CM

## 2018-06-02 DIAGNOSIS — Z7982 Long term (current) use of aspirin: Secondary | ICD-10-CM

## 2018-06-02 DIAGNOSIS — J3489 Other specified disorders of nose and nasal sinuses: Secondary | ICD-10-CM

## 2018-06-02 DIAGNOSIS — I10 Essential (primary) hypertension: Principal | ICD-10-CM

## 2018-06-02 DIAGNOSIS — Z880 Allergy status to penicillin: Secondary | ICD-10-CM

## 2018-06-02 DIAGNOSIS — H538 Other visual disturbances: Secondary | ICD-10-CM

## 2018-06-02 DIAGNOSIS — R42 Dizziness and giddiness: Secondary | ICD-10-CM

## 2018-06-02 DIAGNOSIS — Z823 Family history of stroke: Secondary | ICD-10-CM

## 2018-06-02 DIAGNOSIS — Z79899 Other long term (current) drug therapy: Secondary | ICD-10-CM

## 2018-06-02 DIAGNOSIS — R200 Anesthesia of skin: Secondary | ICD-10-CM

## 2018-06-02 DIAGNOSIS — R51 Headache: Secondary | ICD-10-CM | POA: Insufficient documentation

## 2018-06-02 DIAGNOSIS — Z79891 Long term (current) use of opiate analgesic: Secondary | ICD-10-CM

## 2018-06-02 DIAGNOSIS — G8929 Other chronic pain: Secondary | ICD-10-CM | POA: Insufficient documentation

## 2018-06-02 DIAGNOSIS — Z791 Long term (current) use of non-steroidal anti-inflammatories (NSAID): Secondary | ICD-10-CM

## 2018-06-02 DIAGNOSIS — S3992XA Unspecified injury of lower back, initial encounter: Secondary | ICD-10-CM | POA: Diagnosis present

## 2018-06-02 DIAGNOSIS — M549 Dorsalgia, unspecified: Secondary | ICD-10-CM | POA: Insufficient documentation

## 2018-06-02 DIAGNOSIS — R079 Chest pain, unspecified: Secondary | ICD-10-CM

## 2018-06-02 DIAGNOSIS — R350 Frequency of micturition: Secondary | ICD-10-CM

## 2018-06-02 DIAGNOSIS — M5126 Other intervertebral disc displacement, lumbar region: Secondary | ICD-10-CM | POA: Insufficient documentation

## 2018-06-02 HISTORY — DX: Polyneuropathy, unspecified: G62.9

## 2018-06-02 HISTORY — DX: Dorsalgia, unspecified: M54.9

## 2018-06-02 LAB — COMPREHENSIVE METABOLIC PANEL
ALT: 23 U/L (ref 0–44)
AST: 26 U/L (ref 15–41)
Albumin: 3.8 g/dL (ref 3.5–5.0)
Alkaline Phosphatase: 51 U/L (ref 38–126)
Anion gap: 8 (ref 5–15)
BILIRUBIN TOTAL: 0.6 mg/dL (ref 0.3–1.2)
BUN: 14 mg/dL (ref 6–20)
CALCIUM: 9 mg/dL (ref 8.9–10.3)
CO2: 26 mmol/L (ref 22–32)
Chloride: 106 mmol/L (ref 98–111)
Creatinine, Ser: 1.04 mg/dL (ref 0.61–1.24)
GFR calc Af Amer: >60 mL/min (ref >60)
Glucose, Bld: 115 mg/dL — ABNORMAL HIGH (ref 70–99)
Potassium: 4.4 mmol/L (ref 3.5–5.1)
Sodium: 140 mmol/L (ref 135–145)
TOTAL PROTEIN: 7.7 g/dL (ref 6.5–8.1)

## 2018-06-02 LAB — LIPID PANEL
Cholesterol: 220 mg/dL — ABNORMAL HIGH (ref 0–200)
HDL: 54 mg/dL (ref >40)
LDL Cholesterol: 142 mg/dL — ABNORMAL HIGH (ref 0–99)
Total CHOL/HDL Ratio: 4.1 RATIO
Triglycerides: 121 mg/dL (ref <150)
VLDL: 24 mg/dL (ref 0–40)

## 2018-06-02 LAB — CREATININE, SERUM
Creatinine, Ser: 1.01 mg/dL (ref 0.61–1.24)
GFR calc Af Amer: >60 mL/min (ref >60)
GFR calc non Af Amer: >60 mL/min (ref >60)

## 2018-06-02 LAB — CBC
HCT: 43.8 % (ref 39.0–52.0)
Hemoglobin: 13.6 g/dL (ref 13.0–17.0)
MCH: 29.4 pg (ref 26.0–34.0)
MCHC: 31.1 g/dL (ref 30.0–36.0)
MCV: 94.8 fL (ref 80.0–100.0)
PLATELETS: 185 10*3/uL (ref 150–400)
RBC: 4.62 MIL/uL (ref 4.22–5.81)
RDW: 14.2 % (ref 11.5–15.5)
WBC: 6.2 10*3/uL (ref 4.0–10.5)
nRBC: 0 % (ref 0.0–0.2)

## 2018-06-02 LAB — I-STAT TROPONIN, ED: Troponin i, poc: 0 ng/mL (ref 0.00–0.08)

## 2018-06-02 LAB — HEMOGLOBIN A1C
Hgb A1c MFr Bld: 6.1 % — ABNORMAL HIGH (ref 4.8–5.6)
Mean Plasma Glucose: 128.37 mg/dL

## 2018-06-02 MED ORDER — SENNOSIDES-DOCUSATE SODIUM 8.6-50 MG PO TABS: 1.0000 | ORAL_TABLET | Freq: Every evening | ORAL | Status: DC | PRN

## 2018-06-02 MED ORDER — ATORVASTATIN CALCIUM 80 MG PO TABS: 80.0000 mg | ORAL_TABLET | Freq: Every day | ORAL | 2 refills | Status: DC

## 2018-06-02 MED ORDER — ACETAMINOPHEN 325 MG PO TABS: 650.0000 mg | ORAL_TABLET | Freq: Four times a day (QID) | ORAL | Status: DC | PRN

## 2018-06-02 MED ORDER — PROMETHAZINE HCL 25 MG PO TABS: 12.5000 mg | ORAL_TABLET | Freq: Four times a day (QID) | ORAL | Status: DC | PRN

## 2018-06-02 MED ORDER — ENOXAPARIN SODIUM 40 MG/0.4ML ~~LOC~~ SOLN
40.0000 mg | SUBCUTANEOUS | Status: DC
  Filled 2018-06-02: qty 0.4

## 2018-06-02 MED ORDER — ACETAMINOPHEN 650 MG RE SUPP: 650.0000 mg | Freq: Four times a day (QID) | RECTAL | Status: DC | PRN

## 2018-06-02 MED ORDER — ASPIRIN EC 81 MG PO TBEC: 81.0000 mg | DELAYED_RELEASE_TABLET | Freq: Every day | ORAL | 2 refills | Status: AC

## 2018-06-02 MED ORDER — LORAZEPAM 2 MG/ML IJ SOLN
INTRAMUSCULAR | Status: AC
  Administered 2018-06-02: 1 mg via INTRAVENOUS
  Filled 2018-06-02: qty 1

## 2018-06-02 MED ORDER — AMLODIPINE BESYLATE 5 MG PO TABS: 5.0000 mg | ORAL_TABLET | Freq: Every day | ORAL | 2 refills | Status: DC

## 2018-06-02 MED ORDER — AMLODIPINE BESYLATE 5 MG PO TABS
5.0000 mg | ORAL_TABLET | Freq: Every day | ORAL | Status: DC
  Administered 2018-06-02: 5 mg via ORAL
  Filled 2018-06-02: qty 1

## 2018-06-02 MED ORDER — LORAZEPAM 2 MG/ML IJ SOLN
1.0000 mg | Freq: Once | INTRAMUSCULAR | Status: AC
  Administered 2018-06-02: 1 mg via INTRAVENOUS

## 2018-06-02 NOTE — H&P (Signed)
Date: 06/02/2018               Patient Name:  Todd Ryan MRN: [id number]  DOB: December 31, 1960 Age / Sex: 57 y.o., male   PCP: Patient, No Pcp Per         Medical Service: Internal Medicine Teaching Service         Attending Physician: Dr. Sid Falcon, MD    First Contact: Dr. Donne Hazel Pager: [phone]  Second Contact: Dr. Maricela Bo Pager: [phone]       After Hours (After 5p/  First Contact Pager: [phone]  weekends / holidays): Second Contact Pager: [phone]   Chief Complaint: headache  History of Present Illness:   57yo male with chronic back pain presents with 1 week of worsening headache described as frontal but worse on the right side with occasional shooting pains above his eyebrow and down his nose.  He endorses associated lightheadedness described as "being drunk" and intermittent blurry vision that is new over the last week.   He has a history of chronic back pain and bilateral lower extremity weakness with intermittent right foot numbness since 2015 from a work injury after lifting heavy objects.  He had a laminectomy in 2017.  He notes that his back pain has been worse over the last week.  He follows in a pain management clinic and sees him every 3 months.  He takes oxycodone 5mg  prn (using 2-3 times daily), meloxicam 5mg  bid.  And reports that his prescription is due to be refilled today but he has been having trouble getting in touch with the clinic.    During our encounter he experienced 1 episode of new left-sided chest pain that resolved within minutes without intervention.  Denied associated nausea, vomiting, diaphoresis.  He originally came to the ED for back pain but subsequently developed a worsening headache and initial neuro exam revealed right finger-to-nose dysmetria and disconjugate extraocular eye movements  Head CT was negative. Neurology was consulted. Brain MRI ordered. CMP and CBC WNLs. Intitial troponin undetectable.   Review of Systems: Review of  Systems  Constitutional: Negative for chills, diaphoresis, fever and weight loss.  HENT: Positive for sinus pain. Negative for congestion and sore throat.   Eyes: Positive for blurred vision and redness. Negative for pain and discharge.  Respiratory: Negative for cough and shortness of breath.   Cardiovascular: Positive for chest pain. Negative for palpitations, orthopnea and leg swelling.  Gastrointestinal: Negative for nausea and vomiting.  Genitourinary: Positive for frequency. Negative for dysuria.  Musculoskeletal: Positive for back pain.  Neurological: Positive for dizziness, tingling, focal weakness and headaches. Negative for seizures.  Endo/Heme/Allergies: Negative for polydipsia.    Meds:  Current Meds  Medication Sig  . magnesium oxide (MAG-OX) 400 MG tablet Take 400 mg by mouth daily.  . Meloxicam 5 MG CAPS Take 5 mg by mouth 2 (two) times daily.   Marland Kitchen oxyCODONE-acetaminophen (PERCOCET) 10-325 MG per tablet Take 1 tablet by mouth every 4 (four) hours as needed for pain. (Patient taking differently: Take 1 tablet by mouth 3 (three) times daily. )  . oxyCODONE-acetaminophen (PERCOCET/ROXICET) 5-325 MG tablet Take 1 tablet by mouth 3 (three) times daily.     Allergies: Allergies as of 06/02/2018 - Review Complete 06/02/2018  Allergen Reaction Noted  . Shellfish allergy Nausea And Vomiting 12/22/2014  . Penicillins Hives 11/05/2013   Past Medical History:  Diagnosis Date  . Asthma   . Back pain   . Peripheral neuropathy  Family History: Diabetes (mom and brother), Stroke (mom in her 14s), CAD (dad in his 20s)  Social History: Former smoker, social alcohol use. No illicit drug use. Former Biochemist, clinical.    Physical Exam: Blood pressure (!) 141/104, pulse (!) 59, temperature 98.4 F (36.9 C), temperature source Rectal, resp. rate 14, SpO2 98 %. Vitals:   06/02/18 1100 06/02/18 1145 06/02/18 1200 06/02/18 1300  BP: (!) 141/102 (!) 137/94 (!) 143/93 (!) 141/104    Pulse: 64 61 60 (!) 59  Resp: (!) 22 20 14 14   Temp:      TempSrc:      SpO2: 95% 96% 96% 98%   General: Vital signs reviewed.  Patient is well-developed and well-nourished, in no acute distress and cooperative with exam.  Head: Normocephalic and atraumatic. Eyes: EOMI, conjunctivae mildly injected bilaterally, PERRL Neck: Supple, trachea midline, normal ROM, no JVD, or carotid bruit present.  Cardiovascular: RRR, S1 normal, S2 normal, no murmurs, gallops, or rubs. Pulmonary/Chest: Clear to auscultation bilaterally, scattered wheezes Abdominal: Soft, non-tender, non-distended, BS +, no masses, organomegaly, or guarding present.  Musculoskeletal: no tenderness to palpation over lumbar spine, painful ROM with low back movement and bilateral leg raise (R>L) Extremities: No lower extremity edema bilaterally,  pulses symmetric and intact bilaterally. No cyanosis or clubbing. Neurological: A&O x3, Upper extremity strength is normal and symmetric bilaterally, Lower extremity strength is difficult to assess due to low back pain but appears 3/5 bilaterally, cranial nerve II-XII are grossly intact, no focal motor deficit, sensory intact to light touch bilaterally.  Skin: Warm, dry and intact. No rashes or erythema. Psychiatric: Normal mood and affect. speech and behavior is normal. Cognition and memory are normal.    EKG: personally reviewed my interpretation is NSR, LVH, no ST or t wave abnormalities   CXR: personally reviewed my interpretation is no consolidation or pneumothorax, normal cardiac silhouette   Assessment & Plan by Problem: Principal Problem:   TIA (transient ischemic attack) Active Problems:   Lower back injury   Hypertension  57yo male with chronic back pain and uncontrolled HTN who presents with 1 week of worsening headache, lightheadedness, blurry vision, and questionable dysmetria and disconjugate gaze.   Possible TIA:  New onset headache in the setting of uncontrolled  HTN and initial exam notable for dysmetria with right finger-to-nose and abnormal eye tracking.  Symptoms concerning for TIA versus stroke. Also could be 2/2 hypertensive urgency. Most likely region of the brain involved would be brain stem. On reevaluation with neurology, no dysmetria was appreciated. Slight dysconjugate gaze persisted but can be normal.  Patient continues to endorse vague headache and lightheadedness.  His subjective upper extremity weakness is not appreciated on exam. - neurology consulted: If brain MR is negative, patient can be discharged -CT head was negative - f/u Brain MRI -Risk factor modification: Hemoglobin A1c, lipid panel -We will need PCP at discharge: consult case management   HTN Uncontrolled. Does not have PCP. Evidence of LVH on ekg. Normal renal function.  - start amlodipine 5mg  daily   Chronic Back Pain Secondary to work injury after lifting heavy objects in 2015. 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  Endorsing worsening lower back pain but stable bilateral lower extremity weakness and intermittent right toe numbness.  Denies bowel or bladder incontinence or saddle anesthesia. Follows in pain management clinic q3 months and takes oxycodone 5mg  prn (using 2-3  times daily), meloxicam 5mg  bid.  - check [place] database - repeat lumbar spine MRI - continue home pain meds   Chest Pain: One episode while in the ED. Described as sharp, worse after breathing, located in the left chest. Not reproducible on exam. Last for a minute and resolved without intervention. Initial troponin and ekg reassuring.  - HEART score 2 - if chest pain returns, can trend troponin  Dispo: Admit patient to Observation with expected length of stay less than 2 midnights.  Signed: Isabelle Course, MD 06/02/2018, 2:32 PM  Pager: [phone]

## 2018-06-02 NOTE — ED Triage Notes (Signed)
Pt presents for evaluation of new onset dizziness/lightheadedness/headache/sob/back pain. Reports hx of back surgery but was 3 years ago, no acute injury. Reports had some blood in stool 3 days last week but thought it was related to ulcer.

## 2018-06-02 NOTE — Discharge Summary (Signed)
Name: Todd Ryan MRN: [id number] DOB: 1960-08-16 57 y.o. PCP: Patient, No Pcp Per  Date of Admission: 06/02/2018  8:47 AM Date of Discharge: 06/02/2018 Attending Physician: No att. providers found  Discharge Diagnosis: 1. Possible TIA 2. HTN 3. Chronic back pain   Discharge Medications: Allergies as of 06/02/2018      Reactions   Shellfish Allergy Nausea And Vomiting   Mussels only, can eat oysters   Penicillins Hives   Long-term use      Medication List    STOP taking these medications   diazepam 5 MG tablet Commonly known as:  VALIUM   diclofenac 75 MG EC tablet Commonly known as:  VOLTAREN   methocarbamol 500 MG tablet Commonly known as:  ROBAXIN   polyethylene glycol packet Commonly known as:  MIRALAX / GLYCOLAX   predniSONE 50 MG tablet Commonly known as:  DELTASONE     TAKE these medications   amLODipine 5 MG tablet Commonly known as:  NORVASC Take 1 tablet (5 mg total) by mouth daily.   aspirin EC 81 MG tablet Take 1 tablet (81 mg total) by mouth daily.   atorvastatin 80 MG tablet Commonly known as:  LIPITOR Take 1 tablet (80 mg total) by mouth daily.   magnesium oxide 400 MG tablet Commonly known as:  MAG-OX Take 400 mg by mouth daily.   Meloxicam 5 MG Caps Take 5 mg by mouth 2 (two) times daily.   oxyCODONE-acetaminophen 10-325 MG tablet Commonly known as:  PERCOCET Take 1 tablet by mouth every 4 (four) hours as needed for pain. What changed:    when to take this  Another medication with the same name was removed. Continue taking this medication, and follow the directions you see here.       Disposition and follow-up:   Mr.Todd Ryan was [facility] from [facility] in Good condition.  At the hospital follow up visit please address:  1.  Needs management of HTN, HLD. Started on amlodipine, atorvastatin, aspirin at discharge.   2.  Labs / imaging needed at time of follow-up: none  3.  Pending labs/ test  needing follow-up: none  Follow-up Appointments: Follow-up Information    [place]. Call.   Contact information: [place] [phone] [place] [facility] Course by problem list: 57yo male with chronic back pain and uncontrolled HTN who presents with 1 week of worsening headache, lightheadedness, blurry vision, and questionable dysmetria and disconjugate gaze.   1. Possible TIA: New onset headache in the setting of uncontrolled HTN and initial exam notable for dysmetria with right finger-to-nose and abnormal eye tracking.  CT head and brain MR were unremarkable. Risk factor modification was performed (a1c, lipid panel). He was started on high intensity statin, 81mg  aspirin and discharged home with instructions to establish care with community health and wellness.   2. HTN: started on 5mg  amlodipine daily. Normal renal function   3. Chronic back pain, LE numbness: Continued on his chronic home pain medications. Given worsening pain and leg numbness, repeat lumbar spine MR was done which showed no acute changes   Discharge Vitals:   BP (!) 141/88 (BP Location: Right Arm)   Pulse 69   Temp 98.4 F (36.9 C) (Rectal)   Resp 14   SpO2 95%   Pertinent Labs, Studies, and Procedures:   Lipid Panel     Component Value  Date/Time   CHOL 220 (H) 06/02/2018 1420   TRIG 121 06/02/2018 1420   HDL 54 06/02/2018 1420   CHOLHDL 4.1 06/02/2018 1420   VLDL 24 06/02/2018 1420   LDLCALC 142 (H) 06/02/2018 1420   a1c 6.1  CLINICAL DATA:  Back pain and unsteady gait. History of prior back surgery.  EXAM: MRI LUMBAR SPINE WITHOUT CONTRAST  TECHNIQUE: Multiplanar, multisequence MR imaging of the lumbar spine was performed. No intravenous contrast was administered.  COMPARISON:  Multiple prior outside MRI examinations from [facility]. The most recent 1 is dated 08/15/2015  FINDINGS: Segmentation:  Transitional lumbar anatomy with partial sacralization of L5 on the left side. The last open intervertebral disc space is labeled L5-S1 and seems to correlate with the prior MR examinations.  Alignment:  Normal  Vertebrae: No bone lesions or fracture. Endplate reactive changes noted at L4-5.  Conus medullaris and cauda equina: Conus extends to the L1-2 level. Conus and cauda equina appear normal.  Paraspinal and other soft tissues: No significant findings.  Disc levels:  L1-2: No significant findings.  L2-3: No significant findings.  L3-4: Mild annular bulge and slight osteophytic ridging with mild flattening of the ventral thecal sac but no significant spinal, lateral recess or foraminal stenosis. Mild facet disease appears stable.  L4-5: Prior surgical changes with decompressive laminectomy. There is a persistent annular bulge and osteophytic spurring with mild mass effect on the ventral thecal sac. No foraminal stenosis.  L5-S1: No significant findings.  IMPRESSION: 1. Surgical changes at L4-5 with a residual central disc bulge and osteophytic ridging and mild mass effect on the ventral thecal sac. No significant spinal or foraminal stenosis. 2. Slight bulging disc at L3-4 with slight flattening of the ventral thecal sac but no spinal or foraminal stenosis. 3. The other intervertebral disc spaces are unremarkable.   Electronically Signed   By: Marijo Sanes M.D.   On: 06/02/2018 15:44  CLINICAL DATA:  57 y/o M; 3 days of dizziness, unsteadiness, and blurred vision.  EXAM: MRI HEAD WITHOUT CONTRAST  TECHNIQUE: Multiplanar, multiecho pulse sequences of the brain and surrounding structures were obtained without intravenous contrast.  COMPARISON:  06/02/2018 CT head  FINDINGS: Brain: No acute infarction, hemorrhage, hydrocephalus, extra-axial collection or mass lesion.  Vascular: Normal flow voids.  Skull and upper cervical spine: Normal  marrow signal.  Sinuses/Orbits: Mild mucosal thickening of the paranasal sinuses. No abnormal signal of the mastoid air cells. Orbits are unremarkable.  Other: None.  IMPRESSION: 1. No acute intracranial abnormality. Unremarkable MRI of the brain. 2. Mild paranasal sinus disease.   Electronically Signed   By: Kristine Garbe M.D.   On: 06/02/2018 15:38   Discharge Instructions: Discharge Instructions    Diet - low sodium heart healthy   Complete by:  As directed    Discharge instructions   Complete by:  As directed    Mr. Barg,  We were concerned you had a stroke but thankfully, brain imaging was negative for stroke. It is important to get your high blood pressure and high cholesterol under better control. Please take amlodipine 5mg  daily for blood pressure and atorvostatin 80mg  daily for high cholesterol. I also recommend aspirin 81mg  daily for prevention of stroke. You can buy aspirin over the counter or use the prescription I gave you.   Please call the [facility] at the number provided to get established with a primary care doctor. If you have any questions about your hospital stay, you can  call [phone].   Increase activity slowly   Complete by:  As directed       Signed: Isabelle Course, MD 06/03/2018, 1:12 PM   Pager: [phone]

## 2018-06-02 NOTE — ED Notes (Signed)
Reg tray ordered

## 2018-06-02 NOTE — Discharge Instructions (Signed)
DASH Eating Plan DASH stands for "Dietary Approaches to Stop Hypertension." The DASH eating plan is a healthy eating plan that has been shown to reduce high blood pressure (hypertension). It may also reduce your risk for type 2 diabetes, heart disease, and stroke. The DASH eating plan may also help with weight loss. What are tips for following this plan? General guidelines  Avoid eating more than 2,300 mg (milligrams) of salt (sodium) a day. If you have hypertension, you may need to reduce your sodium intake to 1,500 mg a day.  Limit alcohol intake to no more than 1 drink a day for nonpregnant women and 2 drinks a day for men. One drink equals 12 oz of beer, 5 oz of wine, or 1 oz of hard liquor.  Work with your health care provider to maintain a healthy body weight or to lose weight. Ask what an ideal weight is for you.  Get at least 30 minutes of exercise that causes your heart to beat faster (aerobic exercise) most days of the week. Activities may include walking, swimming, or biking.  Work with your health care provider or diet and nutrition specialist (dietitian) to adjust your eating plan to your individual calorie needs. Reading food labels  Check food labels for the amount of sodium per serving. Choose foods with less than 5 percent of the Daily Value of sodium. Generally, foods with less than 300 mg of sodium per serving fit into this eating plan.  To find whole grains, look for the word "whole" as the first word in the ingredient list. Shopping  Buy products labeled as "low-sodium" or "no salt added."  Buy fresh foods. Avoid canned foods and premade or frozen meals. Cooking  Avoid adding salt when cooking. Use salt-free seasonings or herbs instead of table salt or sea salt. Check with your health care provider or pharmacist before using salt substitutes.  Do not fry foods. Cook foods using healthy methods such as baking, boiling, grilling, and broiling instead.  Cook with  heart-healthy oils, such as olive, canola, soybean, or sunflower oil. Meal planning   Eat a balanced diet that includes: ? 5 or more servings of fruits and vegetables each day. At each meal, try to fill half of your plate with fruits and vegetables. ? Up to 6-8 servings of whole grains each day. ? Less than 6 oz of lean meat, poultry, or fish each day. A 3-oz serving of meat is about the same size as a deck of cards. One egg equals 1 oz. ? 2 servings of low-fat dairy each day. ? A serving of nuts, seeds, or beans 5 times each week. ? Heart-healthy fats. Healthy fats called Omega-3 fatty acids are found in foods such as flaxseeds and coldwater fish, like sardines, salmon, and mackerel.  Limit how much you eat of the following: ? Canned or prepackaged foods. ? Food that is high in trans fat, such as fried foods. ? Food that is high in saturated fat, such as fatty meat. ? Sweets, desserts, sugary drinks, and other foods with added sugar. ? Full-fat dairy products.  Do not salt foods before eating.  Try to eat at least 2 vegetarian meals each week.  Eat more home-cooked food and less restaurant, buffet, and fast food.  When eating at a restaurant, ask that your food be prepared with less salt or no salt, if possible. What foods are recommended? The items listed may not be a complete list. Talk with your dietitian about what   dietary choices are best for you. Grains Whole-grain or whole-wheat bread. Whole-grain or whole-wheat pasta. Brown rice. Oatmeal. Quinoa. Bulgur. Whole-grain and low-sodium cereals. Pita bread. Low-fat, low-sodium crackers. Whole-wheat flour tortillas. Vegetables Fresh or frozen vegetables (raw, steamed, roasted, or grilled). Low-sodium or reduced-sodium tomato and vegetable juice. Low-sodium or reduced-sodium tomato sauce and tomato paste. Low-sodium or reduced-sodium canned vegetables. Fruits All fresh, dried, or frozen fruit. Canned fruit in natural juice (without  added sugar). Meat and other protein foods Skinless chicken or turkey. Ground chicken or turkey. Pork with fat trimmed off. Fish and seafood. Egg whites. Dried beans, peas, or lentils. Unsalted nuts, nut butters, and seeds. Unsalted canned beans. Lean cuts of beef with fat trimmed off. Low-sodium, lean deli meat. Dairy Low-fat (1%) or fat-free (skim) milk. Fat-free, low-fat, or reduced-fat cheeses. Nonfat, low-sodium ricotta or cottage cheese. Low-fat or nonfat yogurt. Low-fat, low-sodium cheese. Fats and oils Soft margarine without trans fats. Vegetable oil. Low-fat, reduced-fat, or light mayonnaise and salad dressings (reduced-sodium). Canola, safflower, olive, soybean, and sunflower oils. Avocado. Seasoning and other foods Herbs. Spices. Seasoning mixes without salt. Unsalted popcorn and pretzels. Fat-free sweets. What foods are not recommended? The items listed may not be a complete list. Talk with your dietitian about what dietary choices are best for you. Grains Baked goods made with fat, such as croissants, muffins, or some breads. Dry pasta or rice meal packs. Vegetables Creamed or fried vegetables. Vegetables in a cheese sauce. Regular canned vegetables (not low-sodium or reduced-sodium). Regular canned tomato sauce and paste (not low-sodium or reduced-sodium). Regular tomato and vegetable juice (not low-sodium or reduced-sodium). Pickles. Olives. Fruits Canned fruit in a light or heavy syrup. Fried fruit. Fruit in cream or butter sauce. Meat and other protein foods Fatty cuts of meat. Ribs. Fried meat. Bacon. Sausage. Bologna and other processed lunch meats. Salami. Fatback. Hotdogs. Bratwurst. Salted nuts and seeds. Canned beans with added salt. Canned or smoked fish. Whole eggs or egg yolks. Chicken or turkey with skin. Dairy Whole or 2% milk, cream, and half-and-half. Whole or full-fat cream cheese. Whole-fat or sweetened yogurt. Full-fat cheese. Nondairy creamers. Whipped toppings.  Processed cheese and cheese spreads. Fats and oils Butter. Stick margarine. Lard. Shortening. Ghee. Bacon fat. Tropical oils, such as coconut, palm kernel, or palm oil. Seasoning and other foods Salted popcorn and pretzels. Onion salt, garlic salt, seasoned salt, table salt, and sea salt. Worcestershire sauce. Tartar sauce. Barbecue sauce. Teriyaki sauce. Soy sauce, including reduced-sodium. Steak sauce. Canned and packaged gravies. Fish sauce. Oyster sauce. Cocktail sauce. Horseradish that you find on the shelf. Ketchup. Mustard. Meat flavorings and tenderizers. Bouillon cubes. Hot sauce and Tabasco sauce. Premade or packaged marinades. Premade or packaged taco seasonings. Relishes. Regular salad dressings. Where to find more information:  National Heart, Lung, and Blood Institute: www.nhlbi.nih.gov  American Heart Association: www.heart.org Summary  The DASH eating plan is a healthy eating plan that has been shown to reduce high blood pressure (hypertension). It may also reduce your risk for type 2 diabetes, heart disease, and stroke.  With the DASH eating plan, you should limit salt (sodium) intake to 2,300 mg a day. If you have hypertension, you may need to reduce your sodium intake to 1,500 mg a day.  When on the DASH eating plan, aim to eat more fresh fruits and vegetables, whole grains, lean proteins, low-fat dairy, and heart-healthy fats.  Work with your health care provider or diet and nutrition specialist (dietitian) to adjust your eating plan to your individual   calorie needs. This information is not intended to replace advice given to you by your health care provider. Make sure you discuss any questions you have with your health care provider. Document Released: 06/04/2011 Document Revised: 06/08/2016 Document Reviewed: 06/08/2016 Elsevier Interactive Patient Education  2018 Elsevier Inc.  

## 2018-06-02 NOTE — Consult Note (Addendum)
Neurology Consultation  Reason for Consult: Unstable gait and disconjugate gaze Referring Physician: Alvino Chapel  CC: Unstable gait and disconjugate gaze  History is obtained from: Patient  HPI: Todd Ryan is a 57 y.o. male with history of severe back pain who sees a pain management doctor, right shoulder pain and now is stating he is having some possible blurred vision and feeling unsteady.  He states that the unsteadiness occurred about 3 days ago.  He feels as though he is dizzy but when he walks he feels as though he is drunk.  He does have severe back pain and prolonged history of neuropathy in his lower legs and feet.  Neurology was asked to see patient for his unstable gait and possible disconjugate gaze.   Of note patient did state that he was having significant back pain this morning and attempted to call his pain management clinic but was unable to thus came to the emergency department due to that reason.  ED course: CT head   ROS:  ROS was performed and is negative except as noted in the HPI.  Past Medical History:  Diagnosis Date  . Asthma   . Back pain   . Peripheral neuropathy    Family History  Problem Relation Age of Onset  . Hypertension Mother      Social History:   reports that he has been smoking cigarettes. He has been smoking about 0.50 packs per day. He does not have any smokeless tobacco history on file. He reports that he drinks alcohol. He reports that he does not use drugs.  Medications  Current Facility-Administered Medications:  .  acetaminophen (TYLENOL) tablet 650 mg, 650 mg, Oral, Q6H PRN **OR** acetaminophen (TYLENOL) suppository 650 mg, 650 mg, Rectal, Q6H PRN, Chundi, Vahini, MD .  enoxaparin (LOVENOX) injection 40 mg, 40 mg, Subcutaneous, Q24H, Chundi, Vahini, MD .  promethazine (PHENERGAN) tablet 12.5 mg, 12.5 mg, Oral, Q6H PRN, Chundi, Vahini, MD .  senna-docusate (Senokot-S) tablet 1 tablet, 1 tablet, Oral, QHS PRN, Chundi, Vahini,  MD  Current Outpatient Medications:  .  magnesium oxide (MAG-OX) 400 MG tablet, Take 400 mg by mouth daily., Disp: , Rfl:  .  Meloxicam 5 MG CAPS, Take 5 mg by mouth 2 (two) times daily. , Disp: , Rfl:  .  oxyCODONE-acetaminophen (PERCOCET) 10-325 MG per tablet, Take 1 tablet by mouth every 4 (four) hours as needed for pain. (Patient taking differently: Take 1 tablet by mouth 3 (three) times daily. ), Disp: 30 tablet, Rfl: 0 .  oxyCODONE-acetaminophen (PERCOCET/ROXICET) 5-325 MG tablet, Take 1 tablet by mouth 3 (three) times daily., Disp: , Rfl: 0 .  diazepam (VALIUM) 5 MG tablet, Take 1 tablet (5 mg total) by mouth every 6 (six) hours as needed for muscle spasms. (Patient not taking: Reported on 06/02/2018), Disp: 25 tablet, Rfl: 0 .  diclofenac (VOLTAREN) 75 MG EC tablet, Take 1 tablet (75 mg total) by mouth 2 (two) times daily. With food (Patient not taking: Reported on 06/02/2018), Disp: 60 tablet, Rfl: 1 .  methocarbamol (ROBAXIN) 500 MG tablet, Take 1 tablet (500 mg total) by mouth every 8 (eight) hours as needed for muscle spasms. (Patient not taking: Reported on 06/02/2018), Disp: 90 tablet, Rfl: 1 .  polyethylene glycol (MIRALAX) packet, Take 17 g by mouth daily. (Patient not taking: Reported on 06/02/2018), Disp: 14 each, Rfl: 0 .  predniSONE (DELTASONE) 50 MG tablet, Take 1 pill daily for 5 days. (Patient not taking: Reported on 06/02/2018), Disp: 5  tablet, Rfl: 0   Exam: Current vital signs: BP (!) 141/104   Pulse (!) 59   Temp 98.4 F (36.9 C) (Rectal)   Resp 14   SpO2 98%  Vital signs in last 24 hours: Temp:  [98.1 F (36.7 C)-98.4 F (36.9 C)] 98.4 F (36.9 C) (12/05 1019) Pulse Rate:  [59-70] 59 (12/05 1300) Resp:  [9-22] 14 (12/05 1300) BP: (137-162)/(93-105) 141/104 (12/05 1300) SpO2:  [93 %-99 %] 98 % (12/05 1300)  Physical Exam  Constitutional: Appears well-developed and well-nourished.  Psych: Affect appropriate to situation Eyes: No scleral injection HENT: No OP  obstrucion Head: Normocephalic.  Cardiovascular: Normal rate and regular rhythm.  Respiratory: Effort normal, non-labored breathing GI: Soft.  No distension. There is no tenderness.  Skin: WDI  Neuro: Mental Status: Patient is awake, alert, oriented to [place], place, month, year, and situation. Patient is able to give a clear and coherent history. No signs of aphasia or neglect Cranial Nerves: II: Visual Fields are full. Pupils are equal, round, and reactive to light.   III,IV, VI: EOMI without ptosis or diploplia.  Patient does have some lag with the left eye when looking horizontally V: Facial sensation is symmetric to temperature VII: Facial movement is symmetric.  VIII: hearing is intact to voice X: Uvula elevates symmetrically XI: Shoulder shrug is symmetric. XII: tongue is midline without atrophy or fasciculations.  Motor: Tone is normal. Bulk is normal. 5/5 strength was present in bilateral upper extremities.  Bilateral lower extremities are 3/5 secondary to pain Sensory: Stocking distribution of peripheral neuropathy  deep Tendon Reflexes: 2+ and symmetric in the biceps no patellar Achilles Plantars: Toes are downgoing bilaterally.  Cerebellar: FNF intact  Labs I have reviewed labs in epic and the results pertinent to this consultation are:   CBC    Component Value Date/Time   WBC 6.2 06/02/2018 0900   RBC 4.62 06/02/2018 0900   HGB 13.6 06/02/2018 0900   HCT 43.8 06/02/2018 0900   PLT 185 06/02/2018 0900   MCV 94.8 06/02/2018 0900   MCH 29.4 06/02/2018 0900   MCHC 31.1 06/02/2018 0900   RDW 14.2 06/02/2018 0900   LYMPHSABS 1.6 12/22/2014 1920   MONOABS 0.5 12/22/2014 1920   EOSABS 0.2 12/22/2014 1920   BASOSABS 0.0 12/22/2014 1920    CMP     Component Value Date/Time   NA 140 06/02/2018 1056   K 4.4 06/02/2018 1056   CL 106 06/02/2018 1056   CO2 26 06/02/2018 1056   GLUCOSE 115 (H) 06/02/2018 1056   BUN 14 06/02/2018 1056   CREATININE 1.04  06/02/2018 1056   CALCIUM 9.0 06/02/2018 1056   PROT 7.7 06/02/2018 1056   ALBUMIN 3.8 06/02/2018 1056   AST 26 06/02/2018 1056   ALT 23 06/02/2018 1056   ALKPHOS 51 06/02/2018 1056   BILITOT 0.6 06/02/2018 1056   GFRNONAA >60 06/02/2018 1056   GFRAA >60 06/02/2018 1056   Imaging I have reviewed the images obtained: CT-scan of the brain--no acute abnormalities MRI examination of the brain--pending  Etta Quill PA-C [facility] [phone]  M-F  (9:00 am- 5:00 PM)  06/02/2018, 1:47 PM     Attending addendum Patient seen and examined. Patient with 3 to 4 days of possible dizziness. Examination suggestive of mild disconjugate gaze. Patient gives history of blurred vision.  On physical exam, sitting comfortably in bed, in some distress due to back pain, normocephalic, atraumatic, moist oral mucous membranes, regular rate rhythm on auscultation of the  chest, chest clear to auscultation and equal air entry bilaterally, no pedal edema. Neurological exam: Awake alert oriented x3, speech is clear, pupils equal round reactive light, visual fields full, face symmetric, tongue midline.  Motor exam shows 5/5 strength in both upper extremities.  Both lower extremities are 3-4/5 secondary to pain.  No focal difference in strength in both legs.  Sensory exam shows stocking type distribution of decreased sensation in both lower extremities.  He does have intact reflexes on the upper extremities but has non-elicitable reflexes in the knees and ankles bilaterally.  Toes are downgoing bilaterally.   Assessment:  57 year old male with 3 days of unsteady gait with exam findings of possible disconjugate gaze.  This may be baseline for him however given his complaint of blurred vision will obtain MRI brain to evaluate for possible pontine or medullary infarct.  In the absence of an MRI abnormality, I do not see any reason to pursue stroke work-up.  Impression: -Unsteady gait -Blurred  vision -Possible brainstem infarct  Recommendations: -MRI brain to evaluate for possible stroke -Patient should be started on 325 mg of aspirin daily If the MRI of the brain is negative, no further recommendations from a neurological standpoint as an inpatient. If the MRI of the brain shows stroke, then he will need a stroke work-up that will include telemetry, echocardiogram, hemoglobin A1 C, lipid panel, frequent neurochecks, physical therapy, occupational therapy and speech therapy evaluations.  Please call us with questions.  Plan was relayed to Dr. Donne Hazel from the primary team over the phone.  -- Amie Portland, MD [facility] Pager: [phone] If 7pm to 7am, please call on call as listed on AMION.

## 2018-06-02 NOTE — ED Provider Notes (Addendum)
[facility] EMERGENCY DEPARTMENT Provider Note   CSN: [id number] Arrival date & time:        History   Chief Complaint Chief Complaint  Patient presents with  . Dizziness    HPI Todd Ryan is a 57 y.o. male.  HPI Patient presents a few different problems.  States that he has had back pain.  Has had previous back pain with surgery.  Over the last few months pain is been getting worse.  In the lower back and will go down to both his legs.  Has sometimes down left leg and sometimes down the right.  No real abdominal pain.  No fevers.  Also has occasional chest pain.  Dull in the mid chest.  States his been checking his blood pressure at home is been elevated.  Up to 867 systolic today.  No history of hypertension.  Also states he is felt "drunk".  States he is been a little unsteady.  States he has difficulty walking sometimes with his legs but states that he feels like he has had a few beers.  Patient states he has not had a few beers.  That has been constant over the last 3 days.  Also states he has had some dark stools.  States he has had blood previously.  States he took the medicine and it stopped.  States he is felt lightheaded when he stands up also.  However the drunken feeling is not only with changing positions. Past Medical History:  Diagnosis Date  . Asthma     Patient Active Problem List   Diagnosis Date Noted  . Lower back injury 12/07/2013    History reviewed. No pertinent surgical history.      Home Medications    Prior to Admission medications   Medication Sig Start Date End Date Taking? Authorizing Provider  magnesium oxide (MAG-OX) 400 MG tablet Take 400 mg by mouth daily.   Yes [provider]  Meloxicam 5 MG CAPS Take 5 mg by mouth 2 (two) times daily.    Yes [provider]  oxyCODONE-acetaminophen (PERCOCET) 10-325 MG per tablet Take 1 tablet by mouth every 4 (four) hours as needed for pain. Patient taking  differently: Take 1 tablet by mouth 3 (three) times daily.  12/22/14  Yes Pamella Pert, MD  oxyCODONE-acetaminophen (PERCOCET/ROXICET) 5-325 MG tablet Take 1 tablet by mouth 3 (three) times daily. 05/03/18  Yes [provider]  diazepam (VALIUM) 5 MG tablet Take 1 tablet (5 mg total) by mouth every 6 (six) hours as needed for muscle spasms. Patient not taking: Reported on 06/02/2018 12/22/14   Pamella Pert, MD  diclofenac (VOLTAREN) 75 MG EC tablet Take 1 tablet (75 mg total) by mouth 2 (two) times daily. With food Patient not taking: Reported on 06/02/2018 01/23/14   Dene Gentry, MD  methocarbamol (ROBAXIN) 500 MG tablet Take 1 tablet (500 mg total) by mouth every 8 (eight) hours as needed for muscle spasms. Patient not taking: Reported on 06/02/2018 01/23/14   Dene Gentry, MD  polyethylene glycol [facility]) packet Take 17 g by mouth daily. Patient not taking: Reported on 06/02/2018 12/22/14   Pamella Pert, MD  predniSONE (DELTASONE) 50 MG tablet Take 1 pill daily for 5 days. Patient not taking: Reported on 06/02/2018 06/20/16   Melony Overly, MD    Family History No family history on file.  Social History Social History   Tobacco Use  . Smoking status: Current Every Day Smoker  Packs/day: 0.50    Types: Cigarettes  Substance Use Topics  . Alcohol use: Yes    Alcohol/week: 0.0 standard drinks  . Drug use: No     Allergies   Shellfish allergy and Penicillins   Review of Systems Review of Systems  Constitutional: Negative for appetite change.  HENT: Negative for congestion.   Cardiovascular: Positive for chest pain.  Gastrointestinal: Negative for abdominal pain.  Genitourinary: Negative for flank pain.  Musculoskeletal: Positive for back pain.  Neurological: Positive for headaches.  Psychiatric/Behavioral: Negative for confusion.     Physical Exam Updated Vital Signs BP (!) 143/93   Pulse 60   Temp 98.4 F (36.9 C) (Rectal)   Resp 14    SpO2 96%   Physical Exam  Constitutional: He is oriented to [place], place, and time. He appears well-developed.  HENT:  Head: Atraumatic.  Eyes:  Mild difficulty tracking with eyes.  Neck: Neck supple.  Cardiovascular: Normal rate.  Pulmonary/Chest: He has no wheezes. He has no rales.  Abdominal: There is no tenderness.  Musculoskeletal:  Mild lumbar tenderness.  Some pain with straight leg raise bilaterally.  Neurological: He is alert and oriented to [place], place, and time.  Mild difficulty tracking with eyes.  Finger-nose off slightly on right compared to left.  Patient is right-handed.  Skin: Skin is warm. Capillary refill takes less than 2 seconds.     ED Treatments / Results  Labs (all labs ordered are listed, but only abnormal results are displayed) Labs Reviewed  COMPREHENSIVE METABOLIC PANEL - Abnormal; Notable for the following components:      Result Value   Glucose, Bld 115 (*)    All other components within normal limits  CBC  I-STAT TROPONIN, ED    EKG EKG Interpretation  Date/Time:  Thursday June 02 2018 08:55:13 EST Ventricular Rate:  72 PR Interval:    QRS Duration: 77 QT Interval:  450 QTC Calculation: 493 R Axis:   74 Text Interpretation:  Sinus rhythm Consider left ventricular hypertrophy Borderline prolonged QT interval Baseline wander in lead(s) V2 V3 Confirmed by Davonna Belling [phone]) on 06/02/2018 10:25:04 AM   Radiology Dg Chest 2 View  Result Date: 06/02/2018 CLINICAL DATA:  Shortness of breath and cough with chest pain EXAM: CHEST - 2 VIEW COMPARISON:  06/20/2016 FINDINGS: Cardiac shadows within normal limits. The lungs are well aerated bilaterally. No focal infiltrate or sizable effusion is seen. No acute bony abnormality is noted. IMPRESSION: No acute abnormality seen. Electronically Signed   By: Inez Catalina M.D.   On: 06/02/2018 10:07   Ct Head Wo Contrast  Result Date: 06/02/2018 CLINICAL DATA:  Dizziness and lightheadedness  EXAM: CT HEAD WITHOUT CONTRAST TECHNIQUE: Contiguous axial images were obtained from the base of the skull through the vertex without intravenous contrast. COMPARISON:  None. FINDINGS: Brain: No evidence of acute infarction, hemorrhage, hydrocephalus, extra-axial collection or mass lesion/mass effect. Vascular: No hyperdense vessel or unexpected calcification. Skull: Normal. Negative for fracture or focal lesion. Sinuses/Orbits: No acute finding. Other: None. IMPRESSION: Normal head CT Electronically Signed   By: Inez Catalina M.D.   On: 06/02/2018 10:28    Procedures Procedures (including critical care time)  Medications Ordered in ED Medications - No data to display   Initial Impression / Assessment and Plan / ED Course  I have reviewed the triage vital signs and the nursing notes.  Pertinent labs & imaging results that were available during my care of the patient were reviewed by me  and considered in my medical decision making (see chart for details).     Patient presents with hypertension.  Also chest pain back pain.  Cardiac work-up reassuring.  However does have the feeling of being drunk.  Dysmetria on right hand on finger-to-nose testing.  Head CT reassuring.  Blood pressure improved somewhat.  However with hypertension and focal neuro deficits will admit to unassigned medicine for further work-up and stroke rule out.  Not a TPA candidate due to time of onset of couple days ago.  Final Clinical Impressions(s) / ED Diagnoses   Final diagnoses:  Hypertension, unspecified type  Dysmetria    ED Discharge Orders    None       Davonna Belling, MD 06/02/18 1218    Davonna Belling, MD 06/02/18 [phone]

## 2018-06-02 NOTE — ED Notes (Signed)
Patient transported to MRI 

## 2018-06-03 LAB — HIV ANTIBODY (ROUTINE TESTING W REFLEX): HIV Screen 4th Generation wRfx: NONREACTIVE

## 2018-06-09 NOTE — Progress Notes (Signed)
Patient ID: Todd Ryan, male   DOB: 1960-12-23, 57 y.o.   MRN: [id number]      Todd Ryan, is a 57 y.o. male  GYF:[id number]  [id number]:[id number]  DOB - 21-Jul-1960  Subjective:  Chief Complaint and HPI: Todd Ryan is a 57 y.o. male here today to establish care and for a follow up visit After being seen in the Ed for dizziness, htn, and back pain.  A1C=6.1, elevated lipids, negative cardiac enzymes.  He is managed at pain clinic with oxycodone.  Not taking meloxicam bc too expensive. Dizziness has resolved.  BP still not controlled.  No CP, No HA.    C/o difficulty sleeping, concentrating, anxiety, worry and depression.  Denies SI/HI.  Never been on meds.   CXR WNL. EKG with no acute changes/ST elevation.   CT head w/o contrast-WNL MR head w/o contrast WNL other than sinus disease MRI L-S spine: IMPRESSION: 1. Surgical changes at L4-5 with a residual central disc bulge and osteophytic ridging and mild mass effect on the ventral thecal sac. No significant spinal or foraminal stenosis. 2. Slight bulging disc at L3-4 with slight flattening of the ventral thecal sac but no spinal or foraminal stenosis. 3. The other intervertebral disc spaces are unremarkable.  ED/Hospital notes reviewed.   Social History:  Both parents deceased near holidays, family is supportive, lots of financial difficulty,  + smoker, no alcohol Family history: mom had htn  ROS:   Constitutional:  No f/c, No night sweats, No unexplained weight loss. EENT:  No vision changes, No blurry vision, No hearing changes. No mouth, throat, or ear problems.  Respiratory: No cough, No SOB Cardiac: No CP, no palpitations GI:  No abd pain, No N/V/D. GU: No Urinary s/sx Musculoskeletal: ongoing back pain Neuro: No headache, no dizziness, no motor weakness.  Skin: No rash Endocrine:  No polydipsia. No polyuria.  Psych: Denies SI/HI  No problems updated.  ALLERGIES: Allergies  Allergen Reactions  . Shellfish  Allergy Nausea And Vomiting    Mussels only, can eat oysters  . Penicillins Hives    Long-term use    PAST MEDICAL HISTORY: Past Medical History:  Diagnosis Date  . Asthma   . Back pain   . Peripheral neuropathy     MEDICATIONS AT HOME: Prior to Admission medications   Medication Sig Start Date End Date Taking? Authorizing Provider  esomeprazole (NEXIUM) 20 MG capsule Take 20 mg by mouth daily at 12 noon.   Yes [provider]  amLODipine (NORVASC) 10 MG tablet Take 1 tablet (10 mg total) by mouth daily. 06/15/18   Argentina Donovan, PA-C  aspirin EC 81 MG tablet Take 1 tablet (81 mg total) by mouth daily. 06/02/18 08/31/18  Isabelle Course, MD  atorvastatin (LIPITOR) 80 MG tablet Take 1 tablet (80 mg total) by mouth daily. 06/02/18   Isabelle Course, MD  magnesium oxide (MAG-OX) 400 MG tablet Take 400 mg by mouth daily.    [provider]  meloxicam (MOBIC) 7.5 MG tablet Take 1 tablet (7.5 mg total) by mouth 2 (two) times daily. 06/15/18   Argentina Donovan, PA-C  methocarbamol (ROBAXIN) 500 MG tablet Take 1 tablet (500 mg total) by mouth every 8 (eight) hours as needed for muscle spasms. 06/15/18   Argentina Donovan, PA-C  oxyCODONE-acetaminophen (PERCOCET) 10-325 MG per tablet Take 1 tablet by mouth every 4 (four) hours as needed for pain. Patient taking differently: Take 1 tablet by mouth 3 (three) times daily.  12/22/14   Pamella Pert, MD  sertraline (ZOLOFT) 50 MG tablet Take 1 tablet (50 mg total) by mouth daily. 06/15/18   Argentina Donovan, PA-C  Tdap (BOOSTRIX) 5-2.5-18.5 LF-MCG/0.5 injection Inject 0.5 mLs into the muscle as directed. 06/15/18   Charlott Rakes, MD     Objective:  EXAM:   Vitals:   06/15/18 0921  BP: (!) 146/96  Pulse: 65  Resp: 16  Temp: 98.1 F (36.7 C)  TempSrc: Oral  SpO2: 98%  Weight: 246 lb 3.2 oz (111.7 kg)  Height: 6\' 2"  (1.88 m)    General appearance : A&OX3. NAD. Non-toxic-appearing HEENT: Atraumatic and  Normocephalic.  PERRLA. EOM intact.  Neck: supple, no JVD. No cervical lymphadenopathy. No thyromegaly Chest/Lungs:  Breathing-non-labored, Good air entry bilaterally, breath sounds normal without rales, rhonchi, or wheezing  CVS: S1 S2 regular, no murmurs, gallops, rubs  Extremities: Bilateral Lower Ext shows no edema, both legs are warm to touch with = pulse throughout Neurology:  CN II-XII grossly intact, Non focal.   Psych:  TP linear. J/I WNL. Normal speech. Appropriate eye contact and affect.  Skin:  No Rash  Data Review Lab Results  Component Value Date   HGBA1C 6.1 (H) 06/02/2018     Assessment & Plan   1. Hyperglycemia I have had a lengthy discussion and provided education about insulin resistance and the intake of too much sugar/refined carbohydrates.  I have advised the patient to work at a goal of eliminating sugary drinks, candy, desserts, sweets, refined sugars, processed foods, and white carbohydrates.  The patient expresses understanding.  - Glucose (CBG)  2. Hypertension, unspecified type Uncontrolled; increase from amlodipine 5 to  - amLODipine (NORVASC) 10 MG tablet; Take 1 tablet (10 mg total) by mouth daily.  Dispense: 90 tablet; Refill: 1  3. Injury of low back, subsequent encounter Followed by pain management-these meds should be affordable here- - meloxicam (MOBIC) 7.5 MG tablet; Take 1 tablet (7.5 mg total) by mouth 2 (two) times daily.  Dispense: 60 tablet; Refill: 1 - methocarbamol (ROBAXIN) 500 MG tablet; Take 1 tablet (500 mg total) by mouth every 8 (eight) hours as needed for muscle spasms.  Dispense: 90 tablet; Refill: 1  4. Depression, unspecified depression type No acute safety issues identified.  Had him meet with Christa See.  I also counseled him 30 mins face to face on stress management, self-care, sleep hygiene, may need assessment with [facility]-can try- - sertraline (ZOLOFT) 50 MG tablet; Take 1 tablet (50 mg total) by mouth daily.  Dispense: 30  tablet; Refill: 3(take 1/2 table for the first week)  5. Encounter for examination following treatment at hospital Improving-establishing care   Patient have been counseled extensively about nutrition and exercise  Return in about 3 weeks (around 07/06/2018) for assign PCP; f/up htn, pain and depression/anxiety.  The patient was given clear instructions to go to ER or return to medical center if symptoms don't improve, worsen or new problems develop. The patient verbalized understanding. The patient was told to call to get lab results if they haven't heard anything in the next week.    Freeman Caldron, PA-C [facility] [place], [place]   06/15/2018, 9:51 AM

## 2018-06-15 ENCOUNTER — Ambulatory Visit: Payer: Self-pay | Attending: Family Medicine | Admitting: Physician Assistant

## 2018-06-15 ENCOUNTER — Ambulatory Visit: Payer: Self-pay | Attending: Family Medicine | Admitting: Licensed Clinical Social Worker

## 2018-06-15 VITALS — BP 146/96 | HR 65 | Temp 98.1°F | Resp 16 | Ht 74.0 in | Wt 246.2 lb

## 2018-06-15 DIAGNOSIS — Z9889 Other specified postprocedural states: Secondary | ICD-10-CM | POA: Insufficient documentation

## 2018-06-15 DIAGNOSIS — Z791 Long term (current) use of non-steroidal anti-inflammatories (NSAID): Secondary | ICD-10-CM | POA: Insufficient documentation

## 2018-06-15 DIAGNOSIS — F331 Major depressive disorder, recurrent, moderate: Secondary | ICD-10-CM

## 2018-06-15 DIAGNOSIS — F419 Anxiety disorder, unspecified: Secondary | ICD-10-CM

## 2018-06-15 DIAGNOSIS — G629 Polyneuropathy, unspecified: Secondary | ICD-10-CM | POA: Insufficient documentation

## 2018-06-15 DIAGNOSIS — Z7982 Long term (current) use of aspirin: Secondary | ICD-10-CM | POA: Insufficient documentation

## 2018-06-15 DIAGNOSIS — Z79899 Other long term (current) drug therapy: Secondary | ICD-10-CM | POA: Insufficient documentation

## 2018-06-15 DIAGNOSIS — F17200 Nicotine dependence, unspecified, uncomplicated: Secondary | ICD-10-CM | POA: Insufficient documentation

## 2018-06-15 DIAGNOSIS — F32A Depression, unspecified: Secondary | ICD-10-CM

## 2018-06-15 DIAGNOSIS — F329 Major depressive disorder, single episode, unspecified: Secondary | ICD-10-CM | POA: Insufficient documentation

## 2018-06-15 DIAGNOSIS — I10 Essential (primary) hypertension: Secondary | ICD-10-CM | POA: Insufficient documentation

## 2018-06-15 DIAGNOSIS — J45909 Unspecified asthma, uncomplicated: Secondary | ICD-10-CM | POA: Insufficient documentation

## 2018-06-15 DIAGNOSIS — R739 Hyperglycemia, unspecified: Secondary | ICD-10-CM | POA: Insufficient documentation

## 2018-06-15 DIAGNOSIS — Z09 Encounter for follow-up examination after completed treatment for conditions other than malignant neoplasm: Secondary | ICD-10-CM

## 2018-06-15 DIAGNOSIS — S3992XD Unspecified injury of lower back, subsequent encounter: Secondary | ICD-10-CM | POA: Insufficient documentation

## 2018-06-15 DIAGNOSIS — Z8249 Family history of ischemic heart disease and other diseases of the circulatory system: Secondary | ICD-10-CM | POA: Insufficient documentation

## 2018-06-15 DIAGNOSIS — Z23 Encounter for immunization: Secondary | ICD-10-CM | POA: Insufficient documentation

## 2018-06-15 DIAGNOSIS — X58XXXD Exposure to other specified factors, subsequent encounter: Secondary | ICD-10-CM | POA: Insufficient documentation

## 2018-06-15 LAB — GLUCOSE, POCT (MANUAL RESULT ENTRY): POC Glucose: 135 mg/dl — AB (ref 70–99)

## 2018-06-15 MED ORDER — TETANUS-DIPHTH-ACELL PERTUSSIS 5-2.5-18.5 LF-MCG/0.5 IM SUSP: 0.5000 mL | INTRAMUSCULAR | 0 refills | Status: DC

## 2018-06-15 MED ORDER — METHOCARBAMOL 500 MG PO TABS: 500.0000 mg | ORAL_TABLET | Freq: Three times a day (TID) | ORAL | 1 refills | Status: DC | PRN

## 2018-06-15 MED ORDER — MELOXICAM 7.5 MG PO TABS: 7.5000 mg | ORAL_TABLET | Freq: Two times a day (BID) | ORAL | 1 refills | Status: DC

## 2018-06-15 MED ORDER — SERTRALINE HCL 50 MG PO TABS: 50.0000 mg | ORAL_TABLET | Freq: Every day | ORAL | 3 refills | Status: DC

## 2018-06-15 MED ORDER — AMLODIPINE BESYLATE 10 MG PO TABS: 10.0000 mg | ORAL_TABLET | Freq: Every day | ORAL | 1 refills | Status: DC

## 2018-06-15 MED FILL — SERTRALINE HCL 50 MG TABS: 50 | 30 days supply | Qty: 30 | Fill #0

## 2018-06-15 MED FILL — MELOXICAM 7.5 MG TABLET: 7.5 | 30 days supply | Qty: 60 | Fill #0

## 2018-06-15 MED FILL — AMLODIPINE BESYLATE 10 MG T: 10 | 30 days supply | Qty: 30 | Fill #0

## 2018-06-15 MED FILL — METHOCARBAMOL 500 MG TABS: 500 | 30 days supply | Qty: 90 | Fill #0

## 2018-06-15 NOTE — Patient Instructions (Addendum)
On the sertraline prescription, take 1/2 tablet daily for 1 week then increase to 1 tablet daily.     Hyperglycemia Hyperglycemia is when the sugar (glucose) level in your blood is too high. It may not cause symptoms. If you do have symptoms, they may include warning signs, such as:  Feeling more thirsty than normal.  Hunger.  Feeling tired.  Needing to pee (urinate) more than normal.  Blurry eyesight (vision). You may get other symptoms as it gets worse, such as:  Dry mouth.  Not being hungry (loss of appetite).  Fruity-smelling breath.  Weakness.  Weight gain or loss that is not planned. Weight loss may be fast.  A tingling or numb feeling in your hands or feet.  Headache.  Skin that does not bounce back quickly when it is lightly pinched and released (poor skin turgor).  Pain in your belly (abdomen).  Cuts or bruises that heal slowly. High blood sugar can happen to people who do or do not have diabetes. High blood sugar can happen slowly or quickly, and it can be an emergency. Follow these instructions at home: General instructions  Take over-the-counter and prescription medicines only as told by your doctor.  Do not use products that contain nicotine or tobacco, such as cigarettes and e-cigarettes. If you need help quitting, ask your doctor.  Limit alcohol intake to no more than 1 drink per day for nonpregnant women and 2 drinks per day for men. One drink equals 12 oz of beer, 5 oz of wine, or 1 oz of hard liquor.  Manage stress. If you need help with this, ask your doctor.  Keep all follow-up visits as told by your doctor. This is important. Eating and drinking   Stay at a healthy weight.  Exercise regularly, as told by your doctor.  Drink enough fluid, especially when you: ? Exercise. ? Get sick. ? Are in hot temperatures.  Eat healthy foods, such as: ? Low-fat (lean) proteins. ? Complex carbs (complex carbohydrates), such as whole wheat bread or  brown rice. ? Fresh fruits and vegetables. ? Low-fat dairy products. ? Healthy fats.  Drink enough fluid to keep your pee (urine) clear or pale yellow. If you have diabetes:   Make sure you know the symptoms of hyperglycemia.  Follow your diabetes management plan, as told by your doctor. Make sure you: ? Take insulin and medicines as told. ? Follow your exercise plan. ? Follow your meal plan. Eat on time. Do not skip meals. ? Check your blood sugar as often as told. Make sure to check before and after exercise. If you exercise longer or in a different way than you normally do, check your blood sugar more often. ? Follow your sick day plan whenever you cannot eat or drink normally. Make this plan ahead of time with your doctor.  Share your diabetes management plan with people in your workplace, school, and household.  Check your urine for ketones when you are ill and as told by your doctor.  Carry a card or wear jewelry that says that you have diabetes. Contact a doctor if:  Your blood sugar level is higher than 240 mg/dL (13.3 mmol/L) for 2 days in a row.  You have problems keeping your blood sugar in your target range.  High blood sugar happens often for you. Get help right away if:  You have trouble breathing.  You have a change in how you think, feel, or act (mental status).  You feel sick  to your stomach (nauseous), and that feeling does not go away.  You cannot stop throwing up (vomiting). These symptoms may be an emergency. Do not wait to see if the symptoms will go away. Get medical help right away. Call your local emergency services (911 in the [place] Do not drive yourself to the hospital. Summary  Hyperglycemia is when the sugar (glucose) level in your blood is too high.  High blood sugar can happen to people who do or do not have diabetes.  Make sure you drink enough fluids, eat healthy foods, and exercise regularly.  Contact your doctor if you have problems  keeping your blood sugar in your target range. This information is not intended to replace advice given to you by your health care provider. Make sure you discuss any questions you have with your health care provider. Document Released: 04/12/2009 Document Revised: 03/02/2016 Document Reviewed: 03/02/2016 Elsevier Interactive Patient Education  2019 [place] is a normal reaction to life events. Stress is what you feel when life demands more than you are used to, or more than you think you can handle. Some stress can be useful, such as studying for a test or meeting a deadline at work. Stress that occurs too often or for too long can cause problems. It can affect your emotional health and interfere with relationships and normal daily activities. Too much stress can weaken your body's defense system (immune system) and increase your risk for physical illness. If you already have a medical problem, stress can make it worse. What are the causes? All sorts of life events can cause stress. An event that causes stress for one M[place]oll[place] may not be stressful for another [place]. Major life events, whether positive or negative, commonly cause stress. Examples include:  Losing a job or starting a new job.  Losing a loved one.  Moving to a new town or home.  Getting married or divorced.  Having a baby.  Injury or illness. Less obvious life events can also cause stress, especially if they occur day after day or in combination with each other. Examples include:  Working long hours.  Driving in traffic.  Caring for children.  Being in debt.  Being in a difficult relationship. What are the signs or symptoms? Stress can cause emotional symptoms, including:  Anxiety. This is feeling worried, afraid, on edge, overwhelmed, or out of control.  Anger, including irritation or impatience.  Depression. This is feeling sad, down, helpless, or guilty.  Trouble focusing, remembering, or  making decisions. Stress can cause physical symptoms, including:  Aches and pains. These may affect your head, neck, back, stomach, or other areas of your body.  Tight muscles or a clenched jaw.  Low energy.  Trouble sleeping. Stress can cause unhealthy behaviors, including:  Eating to feel better (overeating) or skipping meals.  Working too much or putting off tasks.  Smoking, drinking alcohol, or using drugs to feel better. How is this diagnosed? Stress is diagnosed through an assessment by your health care provider. He or she may diagnose this condition based on:  Your symptoms and any stressful life events.  Your medical history.  Tests to rule out other causes of your symptoms. Depending on your condition, your health care provider may refer you to a specialist for further evaluation. How is this treated?  Stress management techniques are the recommended treatment for stress. Medicine is not typically recommended for the treatment of stress. Techniques to reduce your reaction to stressful life  events include:  Stress identification. Monitor yourself for symptoms of stress and identify what causes stress for you. These skills may help you to avoid or prepare for stressful events.  Time management. Set your priorities, keep a calendar of events, and learn to say "no." Taking these actions can help you avoid making too many commitments. Techniques for coping with stress include:  Rethinking the problem. Try to think realistically about stressful events rather than ignoring them or overreacting. Try to find the positives in a stressful situation rather than focusing on the negatives.  Exercise. Physical exercise can release both physical and emotional tension. The key is to find a form of exercise that you enjoy and do it regularly.  Relaxation techniques. These relax the body and mind. The key is to find one or more that you enjoy and use the technique(s) regularly. Examples  include: ? Meditation, deep breathing, or progressive relaxation techniques. ? Yoga or tai chi. ? Biofeedback, mindfulness techniques, or journaling. ? Listening to music, being out in nature, or participating in other hobbies.  Practicing a healthy lifestyle. Eat a balanced diet, drink plenty of water, limit or avoid caffeine, and get plenty of sleep.  Having a strong support network. Spend time with family, friends, or other people you enjoy being around. Express your feelings and talk things over with someone you trust. Counseling or talk therapy with a mental health professional may be helpful if you are having trouble managing stress on your own. Follow these instructions at home: Lifestyle   Avoid drugs.  Do not use any products that contain nicotine or tobacco, such as cigarettes and e-cigarettes. If you need help quitting, ask your health care provider.  Limit alcohol intake to no more than 1 drink a day for nonpregnant women and 2 drinks a day for men. One drink equals 12 oz of beer, 5 oz of wine, or 1 oz of hard liquor.  Do not use alcohol or drugs to relax.  Eat a balanced diet that includes fresh fruits and vegetables, whole grains, lean meats, fish, eggs, and beans, and low-fat dairy. Avoid processed foods and foods high in added fat, sugar, and salt.  Exercise at least 30 minutes on 5 or more days each week.  Get 7-8 hours of sleep each night. General instructions   Practice stress management techniques as discussed with your health care provider.  Drink enough fluid to keep your urine clear or pale yellow.  Take over-the-counter and prescription medicines only as told by your health care provider.  Keep all follow-up visits as told by your health care provider. This is important. Contact a health care provider if:  Your symptoms get worse.  You have new symptoms.  You feel overwhelmed by your problems and can no longer manage them on your own. Get help  right away if:  You have thoughts of hurting yourself or others. If you ever feel like you may hurt yourself or others, or have thoughts about taking your own life, get help right away. You can go to your nearest emergency department or call:  Your local emergency services (911 in the [place]  A suicide crisis helpline, such as the [place] at 782-801-0510. This is open 24 hours a day. Summary  Stress is a normal reaction to life events. It can cause problems if it happens too often or for too long.  Practicing stress management techniques is the best way to treat stress.  Counseling or talk therapy  with a mental health professional may be helpful if you are having trouble managing stress on your own. This information is not intended to replace advice given to you by your health care provider. Make sure you discuss any questions you have with your health care provider. Document Released: 12/09/2000 Document Revised: 08/05/2016 Document Reviewed: 08/05/2016 Elsevier Interactive Patient Education  2019 [place] in the Home, Adult Falls can cause injuries. They can happen to people of all ages. There are many things you can do to make your home safe and to help prevent falls. Ask for help when making these changes, if needed. What actions can I take to prevent falls? General Instructions  Use good lighting in all rooms. Replace any light bulbs that burn out.  Turn on the lights when you go into a dark area. Use night-lights.  Keep items that you use often in easy-to-reach places. Lower the shelves around your home if necessary.  Set up your furniture so you have a clear path. Avoid moving your furniture around.  Do not have throw rugs and other things on the floor that can make you trip.  Avoid walking on wet floors.  If any of your floors are uneven, fix them.  Add color or contrast paint or tape to clearly mark and help you  see: ? Any grab bars or handrails. ? First and last steps of stairways. ? Where the edge of each step is.  If you use a stepladder: ? Make sure that it is fully opened. Do not climb a closed stepladder. ? Make sure that both sides of the stepladder are locked into place. ? Ask someone to hold the stepladder for you while you use it.  If there are any pets around you, be aware of where they are. What can I do in the bathroom?      Keep the floor dry. Clean up any water that spills onto the floor as soon as it happens.  Remove soap buildup in the tub or shower regularly.  Use non-skid mats or decals on the floor of the tub or shower.  Attach bath mats securely with double-sided, non-slip rug tape.  If you need to sit down in the shower, use a plastic, non-slip stool.  Install grab bars by the toilet and in the tub and shower. Do not use towel bars as grab bars. What can I do in the bedroom?  Make sure that you have a light by your bed that is easy to reach.  Do not use any sheets or blankets that are too big for your bed. They should not hang down onto the floor.  Have a firm chair that has side arms. You can use this for support while you get dressed. What can I do in the kitchen?  Clean up any spills right away.  If you need to reach something above you, use a strong step stool that has a grab bar.  Keep electrical cords out of the way.  Do not use floor polish or wax that makes floors slippery. If you must use wax, use non-skid floor wax. What can I do with my stairs?  Do not leave any items on the stairs.  Make sure that you have a light switch at the top of the stairs and the bottom of the stairs. If you do not have them, ask someone to add them for you.  Make sure that there are handrails on both sides  of the stairs, and use them. Fix handrails that are broken or loose. Make sure that handrails are as long as the stairways.  Install non-slip stair treads on all  stairs in your home.  Avoid having throw rugs at the top or bottom of the stairs. If you do have throw rugs, attach them to the floor with carpet tape.  Choose a carpet that does not hide the edge of the steps on the stairway.  Check any carpeting to make sure that it is firmly attached to the stairs. Fix any carpet that is loose or worn. What can I do on the outside of my home?  Use bright outdoor lighting.  Regularly fix the edges of walkways and driveways and fix any cracks.  Remove anything that might make you trip as you walk through a door, such as a raised step or threshold.  Trim any bushes or trees on the path to your home.  Regularly check to see if handrails are loose or broken. Make sure that both sides of any steps have handrails.  Install guardrails along the edges of any raised decks and porches.  Clear walking paths of anything that might make someone trip, such as tools or rocks.  Have any leaves, snow, or ice cleared regularly.  Use sand or salt on walking paths during winter.  Clean up any spills in your garage right away. This includes grease or oil spills. What other actions can I take?  Wear shoes that: ? Have a low heel. Do not wear high heels. ? Have rubber bottoms. ? Are comfortable and fit you well. ? Are closed at the toe. Do not wear open-toe sandals.  Use tools that help you move around (mobility aids) if they are needed. These include: ? Canes. ? Walkers. ? Scooters. ? Crutches.  Review your medicines with your doctor. Some medicines can make you feel dizzy. This can increase your chance of falling. Ask your doctor what other things you can do to help prevent falls. Where to find more information  Centers for Disease Control and Prevention, [place]: https://garcia.biz/  [employer] on Aging: BrainJudge.co.uk Contact a doctor if:  You are afraid of falling at home.  You feel weak, drowsy, or dizzy at home.  You fall at  home. Summary  There are many simple things that you can do to make your home safe and to help prevent falls.  Ways to make your home safe include removing tripping hazards and installing grab bars in the bathroom.  Ask for help when making these changes in your home. This information is not intended to replace advice given to you by your health care provider. Make sure you discuss any questions you have with your health care provider. Document Released: 04/11/2009 Document Revised: 01/28/2017 Document Reviewed: 01/28/2017 Elsevier Interactive Patient Education  2019 [place] (Tetanus and Diphtheria): What You Need to Know 1. Why get vaccinated? Tetanus  and diphtheria are very serious diseases. They are rare in the [place] today, but people who do become infected often have severe complications. Td vaccine is used to protect adolescents and adults from both of these diseases. Both tetanus and diphtheria are infections caused by bacteria. Diphtheria spreads from [place] to [place] through coughing or sneezing. Tetanus-causing bacteria enter the body through cuts, scratches, or wounds. TETANUS (Lockjaw) causes painful muscle tightening and stiffness, usually all over the body.  It can lead to tightening of muscles in the head and neck  so you can't open your mouth, swallow, or sometimes even breathe. Tetanus kills about 1 out of every 10 people who are infected even after receiving the best medical care. DIPHTHERIA can cause a thick coating to form in the back of the throat.  It can lead to breathing problems, paralysis, heart failure, and death. Before vaccines, as many as 200,000 cases of diphtheria and hundreds of cases of tetanus were reported in the [place] each year. Since vaccination began, reports of cases for both diseases have dropped by about 99%. 2. Td vaccine Td vaccine can protect adolescents and adults from tetanus and diphtheria. Td is usually given  as a booster dose every 10 years but it can also be given earlier after a severe and dirty wound or burn. Another vaccine, called Tdap, which protects against pertussis in addition to tetanus and diphtheria, is sometimes recommended instead of Td vaccine. Your doctor or the Aziz giving you the vaccine can give you more information. Td may safely be given at the same time as other vaccines. 3. Some people should not get this vaccine  A [place] who has ever had a life-threatening allergic reaction after a previous dose of any tetanus or diphtheria containing vaccine, OR has a severe allergy to any part of this vaccine, should not get Td vaccine. Tell the Haynie giving the vaccine about any severe allergies.  Talk to your doctor if you: ? had severe pain or swelling after any vaccine containing diphtheria or tetanus, ? ever had a condition called Guillain Barr Syndrome (GBS), ? aren't feeling well on the day the shot is scheduled. 4. Risks of a vaccine reaction With any medicine, including vaccines, there is a chance of side effects. These are usually mild and go away on their own. Serious reactions are also possible but are rare. Most people who get Td vaccine do not have any problems with it. Mild Problems following Td vaccine: (Did not interfere with activities)  Pain where the shot was given (about 8 people in 10)  Redness or swelling where the shot was given (about 1 [place] in 4)  Mild fever (rare)  Headache (about 1 [place] in 4)  Tiredness (about 1 [place] in 4) Moderate Problems following Td vaccine: (Interfered with activities, but did not require medical attention)  Fever over 102F (rare) Severe Problems following Td vaccine: (Unable to perform usual activities; required medical attention)  Swelling, severe pain, bleeding and/or redness in the arm where the shot was given (rare). Problems that could happen after any vaccine:  People sometimes faint after a medical  procedure, including vaccination. Sitting or lying down for about 15 minutes can help prevent fainting, and injuries caused by a fall. Tell your doctor if you feel dizzy, or have vision changes or ringing in the ears.  Some people get severe pain in the shoulder and have difficulty moving the arm where a shot was given. This happens very rarely.  Any medication can cause a severe allergic reaction. Such reactions from a vaccine are very rare, estimated at fewer than 1 in a million doses, and would happen within a few minutes to a few hours after the vaccination. As with any medicine, there is a very remote chance of a vaccine causing a serious injury or death. The safety of vaccines is always being monitored. For more information, visit: http://www.aguilar.org/ 5. What if there is a serious reaction? What should I look for?  Look for anything that concerns you, such as signs  of a severe allergic reaction, very high fever, or unusual behavior. Signs of a severe allergic reaction can include hives, swelling of the face and throat, difficulty breathing, a fast heartbeat, dizziness, and weakness. These would usually start a few minutes to a few hours after the vaccination. What should I do?  If you think it is a severe allergic reaction or other emergency that can't wait, call 9-1-1 or get the [place] to the nearest hospital. Otherwise, call your doctor.  Afterward, the reaction should be reported to the Vaccine Adverse Event Reporting System (VAERS). Your doctor might file this report, or you can do it yourself through the VAERS web site at www.vaers.SamedayNews.es, or by calling 310-685-8590. VAERS does not give medical advice. 6. The National Vaccine Injury Compensation Program The [employer] Vaccine Injury Compensation Program (VICP) is a federal program that was created to compensate people who may have been injured by certain vaccines. Persons who believe they may have been injured by a vaccine can  learn about the program and about filing a claim by calling 218-301-1772 or visiting the [place] website at GoldCloset.com.ee. There is a time limit to file a claim for compensation. 7. How can I learn more?  Ask your doctor. He or she can give you the vaccine package insert or suggest other sources of information.  Call your local or state health department.  Contact the Centers for Disease Control and Prevention (CDC): ? Call 731-493-7143 (1-800-CDC-INFO) ? Visit CDC's website at http://hunter.com/ Vaccine Information Statement Td Vaccine (10/08/15) This information is not intended to replace advice given to you by your health care provider. Make sure you discuss any questions you have with your health care provider. Document Released: 04/12/2006 Document Revised: 01/31/2018 Document Reviewed: 01/31/2018 Elsevier Interactive Patient Education  2019 [place].   Influenza Virus Vaccine injection (Fluarix) What is this medicine? INFLUENZA VIRUS VACCINE (in floo EN zuh VAHY ruhs vak SEEN) helps to reduce the risk of getting influenza also known as the flu. This medicine may be used for other purposes; ask your health care provider or pharmacist if you have questions. COMMON BRAND NAME(S): Fluarix, Fluzone What should I tell my health care provider before I take this medicine? They need to know if you have any of these conditions: -bleeding disorder like hemophilia -fever or infection -Guillain-Barre syndrome or other neurological problems -immune system problems -infection with the human immunodeficiency virus (HIV) or AIDS -low blood platelet counts -multiple sclerosis -an unusual or allergic reaction to influenza virus vaccine, eggs, chicken proteins, latex, gentamicin, other medicines, foods, dyes or preservatives -pregnant or trying to get pregnant -breast-feeding How should I use this medicine? This vaccine is for injection into a muscle. It is given by a  health care professional. A copy of Vaccine Information Statements will be given before each vaccination. Read this sheet carefully each time. The sheet may change frequently. Talk to your pediatrician regarding the use of this medicine in children. Special care may be needed. Overdosage: If you think you have taken too much of this medicine contact a poison control center or emergency room at once. NOTE: This medicine is only for you. Do not share this medicine with others. What if I miss a dose? This does not apply. What may interact with this medicine? -chemotherapy or radiation therapy -medicines that lower your immune system like etanercept, anakinra, infliximab, and adalimumab -medicines that treat or prevent blood clots like warfarin -phenytoin -steroid medicines like prednisone or cortisone -theophylline -vaccines This list may  not describe all possible interactions. Give your health care provider a list of all the medicines, herbs, non-prescription drugs, or dietary supplements you use. Also tell them if you smoke, drink alcohol, or use illegal drugs. Some items may interact with your medicine. What should I watch for while using this medicine? Report any side effects that do not go away within 3 days to your doctor or health care professional. Call your health care provider if any unusual symptoms occur within 6 weeks of receiving this vaccine. You may still catch the flu, but the illness is not usually as bad. You cannot get the flu from the vaccine. The vaccine will not protect against colds or other illnesses that may cause fever. The vaccine is needed every year. What side effects may I notice from receiving this medicine? Side effects that you should report to your doctor or health care professional as soon as possible: -allergic reactions like skin rash, itching or hives, swelling of the face, lips, or tongue Side effects that usually do not require medical attention (report to  your doctor or health care professional if they continue or are bothersome): -fever -headache -muscle aches and pains -pain, tenderness, redness, or swelling at site where injected -weak or tired This list may not describe all possible side effects. Call your doctor for medical advice about side effects. You may report side effects to FDA at 1-800-FDA-1088. Where should I keep my medicine? This vaccine is only given in a clinic, pharmacy, doctor's office, or other health care setting and will not be stored at home. NOTE: This sheet is a summary. It may not cover all possible information. If you have questions about this medicine, talk to your doctor, pharmacist, or health care provider.  2019 Elsevier/Gold Standard (2008-01-11 09:30:40)

## 2018-06-20 NOTE — BH Specialist Note (Signed)
Integrated Behavioral Health Initial Visit  MRN: [id number] Name: Todd Ryan  Number of [place] Clinician visits:: 1/6 Session Start time: 9:50 AM  Session End time: 10:20 AM Total time: 30 minutes  Type of Service: [place] Interpretor:No. Interpretor Name and Language: N/A   Warm Hand Off Completed.       SUBJECTIVE: Todd Ryan is a 57 y.o. male accompanied by self Patient was referred by Weyman Pedro for depression and anxiety. Patient reports the following symptoms/concerns: Pt reports difficulty managing chronic pain, ongoing medical conditions, and mental health Duration of problem: Ongoing; Severity of problem: severe  OBJECTIVE: Mood: Anxious and Depressed and Affect: Appropriate Risk of harm to self or others: No plan to harm self or others  LIFE CONTEXT: Family and Social: Pt has siblings and a niece that provide limited support School/Work: Pt is uninsured and unemployed. He was denied medicaid and recently fired his disability lawyer  Self-Care: Pt encourages self and tries to relax on couch (pt states it only works 20% of the time) Pt receives services at a local pain management clinic Life Changes: Pt reports difficulty managing chronic pain, ongoing medical conditions, and mental health  GOALS ADDRESSED: Patient will: 1. Reduce symptoms of: anxiety, depression and stress 2. Increase knowledge and/or ability of: coping skills and healthy habits  3. Demonstrate ability to: Increase healthy adjustment to current life circumstances and Increase adequate support systems for patient/family  INTERVENTIONS: Interventions utilized: Mindfulness or Psychologist, educational, Supportive Counseling, Psychoeducation and/or Health Education and Link to [employer]  Standardized Assessments completed: GAD-7 and PHQ 2&9  ASSESSMENT: Patient currently experiencing depression and anxiety triggered by  chronic pain and ongoing medical conditions. He receives limited support in the community.   Patient may benefit from psychoeducation and psychotherapy. He receives services at a local pain management clinic. Pt agreed to medication management through PCP. Therapeutic interventions were discussed to decrease and/or manage symptoms. Supportive resources for behavioral health, financial counseling, and legal aid were provided.   PLAN: 1. Follow up with behavioral health clinician on : Pt was encouraged to contact [facility] if symptoms worsen or fail to improve to schedule behavioral appointments at [facility]. 2. Behavioral recommendations: [facility] recommends that pt apply healthy coping skills discussed, comply with medication management, and utilize provided resources. Pt is encouraged to schedule follow up appointment with [facility] 3. Referral(s): [place] (In Clinic), [place] (LME/Outside Clinic) and Community Resources:  Finances and Legal Aid 4. "From scale of 1-10, how likely are you to follow plan?":   Rebekah Chesterfield, LCSW 06/20/18 3:27 PM

## 2018-07-08 ENCOUNTER — Ambulatory Visit: Payer: Self-pay | Admitting: Licensed Clinical Social Worker

## 2018-07-08 ENCOUNTER — Ambulatory Visit: Payer: Self-pay | Attending: Family Medicine | Admitting: Family Medicine

## 2018-07-08 ENCOUNTER — Encounter: Payer: Self-pay | Admitting: Family Medicine

## 2018-07-08 VITALS — BP 130/87 | HR 75 | Temp 98.6°F | Resp 18 | Ht 74.5 in | Wt 244.0 lb

## 2018-07-08 DIAGNOSIS — K219 Gastro-esophageal reflux disease without esophagitis: Secondary | ICD-10-CM

## 2018-07-08 DIAGNOSIS — E785 Hyperlipidemia, unspecified: Secondary | ICD-10-CM

## 2018-07-08 DIAGNOSIS — F419 Anxiety disorder, unspecified: Secondary | ICD-10-CM

## 2018-07-08 DIAGNOSIS — R45851 Suicidal ideations: Secondary | ICD-10-CM

## 2018-07-08 DIAGNOSIS — F3341 Major depressive disorder, recurrent, in partial remission: Secondary | ICD-10-CM

## 2018-07-08 DIAGNOSIS — R7303 Prediabetes: Secondary | ICD-10-CM

## 2018-07-08 DIAGNOSIS — F331 Major depressive disorder, recurrent, moderate: Secondary | ICD-10-CM

## 2018-07-08 DIAGNOSIS — Z8673 Personal history of transient ischemic attack (TIA), and cerebral infarction without residual deficits: Secondary | ICD-10-CM

## 2018-07-08 DIAGNOSIS — I10 Essential (primary) hypertension: Secondary | ICD-10-CM

## 2018-07-08 DIAGNOSIS — R413 Other amnesia: Secondary | ICD-10-CM

## 2018-07-08 MED ORDER — AMLODIPINE BESYLATE 10 MG PO TABS: 10.0000 mg | ORAL_TABLET | Freq: Every day | ORAL | 1 refills | Status: DC

## 2018-07-08 MED ORDER — ATORVASTATIN CALCIUM 80 MG PO TABS: 80.0000 mg | ORAL_TABLET | Freq: Every day | ORAL | 6 refills | Status: DC

## 2018-07-08 MED ORDER — ATORVASTATIN CALCIUM 80 MG PO TABS: 80.0000 mg | ORAL_TABLET | Freq: Every day | ORAL | 6 refills | Status: AC

## 2018-07-08 MED ORDER — SERTRALINE HCL 50 MG PO TABS: 50.0000 mg | ORAL_TABLET | Freq: Every day | ORAL | 3 refills | Status: DC

## 2018-07-08 MED ORDER — ESOMEPRAZOLE MAGNESIUM 20 MG PO CPDR: 20.0000 mg | DELAYED_RELEASE_CAPSULE | Freq: Every day | ORAL | 6 refills | Status: DC

## 2018-07-08 NOTE — BH Specialist Note (Signed)
Integrated Behavioral Health Follow Up Visit  MRN: [id number] Name: Todd Ryan  Number of [place] Clinician visits: 2/6 Session Start time: 4:45 PM  Session End time: 5:05 PM Total time: 20 minutes  Type of Service: Integrated Behavioral Health- Individual/Family Interpretor:No.   SUBJECTIVE: Todd Ryan is a 58 y.o. male accompanied by SELF Patient was referred by PCP Cammie Fulp for suicidal ideation, depression, and anxiety. Patient reports the following symptoms/concerns: Pt has had difficulty sleeping and decreased appetite. He states that at times he thinks about suicide but doesn't plan to do it.  Duration of problem: ongoing; Severity of problem: moderate  OBJECTIVE: Mood: Anxious and Affect: Appropriate Risk of harm to self or others: Suicidal ideation  LIFE CONTEXT: Family and Social: Pt has limited support. School/Work: Pt is uninsured and is in the process of applying for disability. Plans to apply for CAFA. Self-Care: Pt encourages self and tries to relax. Desires to exercises but is unable to due to back injury. Life Changes: Reports chronic pain and mental health concerns  GOALS ADDRESSED: Patient will: 1.  Reduce symptoms of: anxiety, depression, insomnia and stress  2.  Increase knowledge and/or ability of: coping skills and stress reduction  3.  Demonstrate ability to: Increase healthy adjustment to current life circumstances  INTERVENTIONS: Interventions utilized:  Motivational Interviewing, Supportive Counseling and Sleep Hygiene Standardized Assessments completed: C-SSRS Short, GAD-7 and PHQ 2&9  ASSESSMENT: Patient currently experiencing depression and anxiety triggered by chronic pain and medical conditions. Pt has suicidal ideations with no plan or intent. Pt is low risk. Pt has limited support. Pt is currently taking medication for depression and anxiety prescribed by PCP. Reports that it helps but believes counseling will  assist more. Pt has difficulty sleeping and decreased appetite. MSW intern provided information on sleep hygiene. Pt inquired about financial counseling. MSW intern assisted pt with concerns.   Patient may benefit from psychotherapy. MSW intern provided pt with behavioral health resources and pt plans to attend.   PLAN: 1. Follow up with behavioral health clinician on : MSW intern encouraged pt to schedule appt if needed.  2. Behavioral recommendations: Attend behavioral health resources and engage in relaxation techniques 3. Referral(s): [place] (In Clinic) and [place] (LME/Outside Clinic) 4. "From scale of 1-10, how likely are you to follow plan?":   Ruffin Pyo, MSW Intern 07/08/2018, 5:46 PM

## 2018-07-08 NOTE — Progress Notes (Addendum)
Subjective:    Patient ID: Todd Ryan, male    DOB: October 25, 1960, 58 y.o.   MRN: [id number]  HPI       58 yo male new to me as a patient who was seen here in the office on 06/15/18 to establish care s/p ED visit on 06/02/18 for Hypertension, dizziness and back pain. Patient was also found to have elevated blood sugar with a Hgb A1c of 6.1. Patient was given dietary counseling, amlodopine increased to 10 mg daily and RX for Meloxicam at his recent visit here as patient is also followed by pain management. Patient is also on sertraline for depression and had social work consult at his recent visit.       At today's visit, patient states that he recently checked his blood pressure at a pharmacy and his blood pressure was within normal.  Patient is taking the amlodipine 10 mg daily.  Patient also takes Lipitor 80 mg which he states he was prescribed at the hospital but he will need a refill as he only has a pill for tonight.  Patient also states that he was prescribed a daily aspirin at the emergency department.  Patient reports that he has been taking a daily adult aspirin.  Patient does have some reflux type symptoms such as burping/belching and occasional epigastric discomfort but he states that this is also improved with use of Nexium 20 mg which he has been taking as well.  Patient states that he tried the muscle relaxant prescribed at his last visit.  Patient states that the muscle relaxant made his stomach feel funny and made him feel nervous and patient states that he read the name of the medicine and saw that it ended in AMOL similar to tramadol to which he states he also has a similar reaction therefore patient stopped the use of the muscle relaxant.  Patient reports that he does not have pain at today's visit as his back pain tends to come and go.  Patient states that he is not really sure why he was told to take the daily baby aspirin and the atorvastatin.  Patient does report a family history of  mother and father with heart disease.  Patient also states that his mother has high blood pressure.  Patient also states that he has a concern regarding memory loss over the last 2 years or more as he has difficulty recalling things from day-to-day.  Patient denies increased thirst, no urinary frequency and no visual disturbance related to his prediabetes.  Past Medical History:  Diagnosis Date  . Asthma   . Back pain   . Peripheral neuropathy    Family History  Problem Relation Age of Onset  . Hypertension Mother   patient at today's visit reports that mother and father with heart disease Social History   Tobacco Use  . Smoking status: Current Every Day Smoker    Packs/day: 0.50    Types: Cigarettes  . Smokeless tobacco: Never Used  Substance Use Topics  . Alcohol use: Yes    Alcohol/week: 0.0 standard drinks  . Drug use: No   Allergies  Allergen Reactions  . Shellfish Allergy Nausea And Vomiting    Mussels only, can eat oysters  . Penicillins Hives    Long-term use    Review of Systems  Constitutional: Positive for fatigue (improved). Negative for chills and fever.  HENT: Positive for congestion.   Respiratory: Negative for cough and shortness of breath.   Cardiovascular: Negative  for chest pain, palpitations and leg swelling.  Gastrointestinal: Positive for abdominal pain (occasional mid-upper abdominal pain). Negative for blood in stool and nausea.  Genitourinary: Negative for dysuria and frequency.  Musculoskeletal: Positive for back pain. Negative for arthralgias and gait problem.  Neurological: Negative for dizziness and headaches.  Hematological: Negative for adenopathy. Does not bruise/bleed easily.       Objective:   Physical Exam BP 130/87 (BP Location: Right Arm, Patient Position: Sitting, Cuff Size: Large)   Pulse 75   Temp 98.6 F (37 C) (Oral)   Resp 18   Ht 6' 2.5" (1.892 m)   Wt 244 lb (110.7 kg)   SpO2 97%   BMI 30.91 kg/m Nurse's notes and  vital signs reviewed General-well-nourished, well-developed larger framed male Neck-supple, no lymphadenopathy, mild thyroid fullness Cardiovascular-regular rate and rhythm Abdomen-soft, nontender, no rebound or guarding Back-no CVA tenderness; patient with thoracolumbar paraspinous spasm but no reproducible tenderness with palpation over the thoracic or lumbar spine at today's visit Extremities-no edema Neuro-cranial nerves II through XII grossly intact        Assessment & Plan:  1. Essential hypertension Patient's blood pressure today's visit is controlled at 130/87 on amlodipine 10 mg which patient will continue.  Patient should continue Dash type diet along with regular exercise and goal of weight loss as well as a low-sodium diet.  Amlodipine refill sent to patient's pharmacy - amLODipine (NORVASC) 10 MG tablet; Take 1 tablet (10 mg total) by mouth daily.  Dispense: 90 tablet; Refill: 1  2. Hyperlipidemia, unspecified hyperlipidemia type Patient needs refill of atorvastatin 80 mg.  Patient states that he does not know why he is on this medication however patient has mention in past records of history of TIA and patient reports that he has a family history of heart disease.  On review of chart, patient had a lipid panel done 06/02/2018 with total cholesterol of 120 and LDL of 142 and HDL of 54.  Patient may be able to decrease his atorvastatin to 40 mg daily if his next lipid panel shows normal LDL. - atorvastatin (LIPITOR) 80 MG tablet; Take 1 tablet (80 mg total) by mouth daily. To lower cholesterol  Dispense: 30 tablet; Refill: 6  3. History of TIA (transient ischemic attack) There is a report in patient's chart of a history of TIA.  Patient did have recent head CT and MRI of the brain done on 06/02/2018 which were negative.  Patient was encouraged to decrease daily aspirin from current 325 to 81 mg daily as the 325 mg dose is likely contributing to his occasional epigastric pain and  reflux symptoms. - atorvastatin (LIPITOR) 80 MG tablet; Take 1 tablet (80 mg total) by mouth daily. To lower cholesterol  Dispense: 30 tablet; Refill: 6  4. Gastroesophageal reflux disease, esophagitis presence not specified Patient is provided with refill of Nexium 20 mg to take daily.  Patient is encouraged to decrease aspirin from 325 mg to 81 mg once daily.  Patient should eat prior to taking aspirin.  Patient also encouraged to avoid late night eating and to avoid foods which tend to trigger his reflux symptoms - esomeprazole (NEXIUM) 20 MG capsule; Take 1 capsule (20 mg total) by mouth daily at 12 noon.  Dispense: 30 capsule; Refill: 6  5. Recurrent major depression in partial [facility]  [facility] Patient is on sertraline which he reports has been helpful in decreasing his anxiety/depression as patient states that he previously would tend to worry a lot.  Patient also feels that this is helped improve his sleep as he does not lie in bed worrying and feeling as if he is having racing thoughts/worry.  Patient will continue current 50 mg dose and will be reevaluated in 6 weeks.  Patient also had social work consult at today's visit - sertraline (ZOLOFT) 50 MG tablet; Take 1 tablet (50 mg total) by mouth daily.  Dispense: 30 tablet; Refill: 3  6. Memory difficulty I discussed with the patient that there were different things that can cause issues with the memory.  Discussed possibility that his memory changes were associated with his untreated hypertension.  Patient did not have any signs of ischemic microvascular disease mentioned on CT or MRI.  Medications can also cause issues with the memory and patient with a history of opioid use for his chronic back pain.  Also discussed obtaining labs such as TSH and vitamin B-12 at some point in follow-up of his memory issues and if patient continues to be concerned then obtaining neurology referral.  Patient reports that he will consider labs and referrals and  discussed at a future visit  7.  Prediabetes Patient with hemoglobin A1c in December which was elevated at 6.1 and patient is encouraged to continue with dietary changes as well as exercise  An After Visit Summary was printed and given to the patient.  Allergies as of 07/08/2018      Reactions   Shellfish Allergy Nausea And Vomiting   Mussels only, can eat oysters   Penicillins Hives   Long-term use      Medication List       Accurate as of July 08, 2018  6:07 PM. Always use your most recent med list.        amLODipine 10 MG tablet Commonly known as:  NORVASC Take 1 tablet (10 mg total) by mouth daily.   aspirin EC 81 MG tablet Take 1 tablet (81 mg total) by mouth daily.   atorvastatin 80 MG tablet Commonly known as:  LIPITOR Take 1 tablet (80 mg total) by mouth daily. To lower cholesterol   esomeprazole 20 MG capsule Commonly known as:  NEXIUM Take 1 capsule (20 mg total) by mouth daily at 12 noon.   magnesium oxide 400 MG tablet Commonly known as:  MAG-OX Take 400 mg by mouth daily.   meloxicam 7.5 MG tablet Commonly known as:  MOBIC Take 1 tablet (7.5 mg total) by mouth 2 (two) times daily.   oxyCODONE-acetaminophen 10-325 MG tablet Commonly known as:  PERCOCET Take 1 tablet by mouth every 4 (four) hours as needed for pain.   sertraline 50 MG tablet Commonly known as:  ZOLOFT Take 1 tablet (50 mg total) by mouth daily.       Return in about 6 weeks (around 08/19/2018) for HTN and other issues.

## 2018-07-11 MED FILL — AMLODIPINE BESYLATE 10 MG T: 10 | 30 days supply | Qty: 30 | Fill #0

## 2018-07-11 MED FILL — ESOMEPRAZOLE MAGNESIUM 20 M: 20 | 30 days supply | Qty: 30 | Fill #0

## 2018-07-11 MED FILL — SERTRALINE HCL 50 MG TABS: 50 | 30 days supply | Qty: 30 | Fill #0

## 2018-07-11 MED FILL — ATORVASTATIN 80 MG TABLET: 80 | 30 days supply | Qty: 30 | Fill #0

## 2018-08-22 ENCOUNTER — Ambulatory Visit: Payer: Self-pay | Attending: Family Medicine | Admitting: Family Medicine

## 2018-08-22 ENCOUNTER — Encounter: Payer: Self-pay | Admitting: Family Medicine

## 2018-08-22 VITALS — BP 133/84 | HR 74 | Temp 98.4°F | Resp 18 | Ht 75.0 in | Wt 243.0 lb

## 2018-08-22 DIAGNOSIS — G479 Sleep disorder, unspecified: Secondary | ICD-10-CM

## 2018-08-22 DIAGNOSIS — F3341 Major depressive disorder, recurrent, in partial remission: Secondary | ICD-10-CM

## 2018-08-22 DIAGNOSIS — Z720 Tobacco use: Secondary | ICD-10-CM

## 2018-08-22 DIAGNOSIS — I10 Essential (primary) hypertension: Secondary | ICD-10-CM

## 2018-08-22 DIAGNOSIS — F411 Generalized anxiety disorder: Secondary | ICD-10-CM

## 2018-08-22 DIAGNOSIS — E7800 Pure hypercholesterolemia, unspecified: Secondary | ICD-10-CM

## 2018-08-22 DIAGNOSIS — R7303 Prediabetes: Secondary | ICD-10-CM

## 2018-08-22 DIAGNOSIS — J309 Allergic rhinitis, unspecified: Secondary | ICD-10-CM

## 2018-08-22 DIAGNOSIS — R413 Other amnesia: Secondary | ICD-10-CM

## 2018-08-22 DIAGNOSIS — R42 Dizziness and giddiness: Secondary | ICD-10-CM

## 2018-08-22 DIAGNOSIS — K219 Gastro-esophageal reflux disease without esophagitis: Secondary | ICD-10-CM

## 2018-08-22 MED ORDER — SERTRALINE HCL 100 MG PO TABS: 100.0000 mg | ORAL_TABLET | Freq: Every day | ORAL | 3 refills | Status: DC

## 2018-08-22 MED ORDER — BUSPIRONE HCL 10 MG PO TABS: 10.0000 mg | ORAL_TABLET | Freq: Three times a day (TID) | ORAL | 3 refills | Status: DC

## 2018-08-22 MED ORDER — CETIRIZINE HCL 10 MG PO TABS: ORAL_TABLET | ORAL | 6 refills | Status: AC

## 2018-08-22 MED FILL — SERTRALINE HCL 100 MG TAB: 100 | 30 days supply | Qty: 30 | Fill #0

## 2018-08-22 MED FILL — busPIRone HCL 10 MG TABS: 10 | 30 days supply | Qty: 90 | Fill #0

## 2018-08-22 NOTE — Progress Notes (Signed)
Subjective:    Patient ID: Todd Ryan, male    DOB: 1960/07/05, 58 y.o.   MRN: [id number]  HPI       58 yo male who is seen in follow-up of office visit on 07/08/2018 at which time patient was seen in follow-up of multiple issues including hypertension, hyperlipidemia, history of TIA and he has chronic low back pain for which he is followed by pain management.  Patient also with a history of depression/anxiety with sleep disturbance.  Patient was prescribed sertraline at his last visit and he reports that this has improved his sleep as well as decreased his recurrent worrying which was contributing to his inability to sleep.  Patient however feels that he would benefit from a higher dose of the medication.  Patient still has an issue with snoring as well as nonrestorative sleep.  Patient denies any suicidal thoughts or ideations.      Patient reports that his issues with upper epigastric pain/mid chest pain have mostly resolved with the use of omeprazole.  Patient denies any increased muscle or joint pain with his use of atorvastatin 80 mg.  Patient is taking amlodipine 10 mg for his blood pressure and denies any headaches or dizziness related to his blood pressure.  Patient does still have occasional issues with dizziness or changes in position.  Patient does have issues with recurrent nasal congestion and occasional sensation of ear pressure.  He also has occasional frontal headache/pressure.  Patient is trying to increase his water intake in case of dehydration.  Patient does have some increased thirst but he is not sure if this is related to his prediabetes.  Patient also continues to take oxycodone as prescribed by pain management and patient states that he is aware of the increased risk of constipation and patient takes medication as needed to help prevent constipation related to opioid drug use.  He does continue to feel as if he has issues with memory and concentration.  Past Medical History:    Diagnosis Date  . Asthma   . Back pain   . Peripheral neuropathy    Family History  Problem Relation Age of Onset  . Hypertension Mother   . CAD Father    Social History   Tobacco Use  . Smoking status: Current Every Day Smoker    Packs/day: 0.50    Types: Cigarettes  . Smokeless tobacco: Never Used  Substance Use Topics  . Alcohol use: Yes    Alcohol/week: 0.0 standard drinks  . Drug use: No   Allergies  Allergen Reactions  . Shellfish Allergy Nausea And Vomiting    Mussels only, can eat oysters  . Penicillins Hives    Long-term use    Review of Systems  Constitutional: Positive for fatigue. Negative for chills and fever.  HENT: Positive for congestion and postnasal drip. Negative for sore throat and trouble swallowing.   Eyes: Negative for photophobia and visual disturbance.  Respiratory: Negative for cough and shortness of breath.   Cardiovascular: Negative for chest pain, palpitations and leg swelling.  Gastrointestinal: Negative for abdominal pain, blood in stool, constipation, diarrhea, nausea and vomiting.  Endocrine: Positive for polydipsia and polyphagia. Negative for polyuria.  Genitourinary: Negative for dysuria and frequency.  Musculoskeletal: Positive for arthralgias and back pain.  Neurological: Positive for dizziness and headaches (occasional frontal; decreased after BP meds and patient believes may be sinus related).  Hematological: Negative for adenopathy. Does not bruise/bleed easily.  Psychiatric/Behavioral: Positive for sleep disturbance. Negative  for self-injury and suicidal ideas. The patient is nervous/anxious.        Objective:   Physical Exam Constitutional:      General: He is not in acute distress.    Appearance: Normal appearance.  HENT:     Head: Normocephalic and atraumatic.     Right Ear: Tympanic membrane, ear canal and external ear normal.     Left Ear: Tympanic membrane, ear canal and external ear normal.     Ears:      Comments: TMs are slightly dull, patient with edema the nasal turbinates with clear nasal discharge and patient with posterior pharynx erythema-mild.  Patient with large tongue base and decreased posterior pharynx airway secondary to tonsillar arch edema/body habitus as well as large tongue base    Nose: Congestion and rhinorrhea present.     Mouth/Throat:     Mouth: Mucous membranes are moist.     Pharynx: Oropharynx is clear. Posterior oropharyngeal erythema present.  Eyes:     Extraocular Movements: Extraocular movements intact.     Conjunctiva/sclera: Conjunctivae normal.  Neck:     Musculoskeletal: Normal range of motion and neck supple. No neck rigidity or muscular tenderness.     Vascular: No carotid bruit.  Cardiovascular:     Rate and Rhythm: Normal rate and regular rhythm.  Pulmonary:     Effort: Pulmonary effort is normal.     Breath sounds: Normal breath sounds. No wheezing or rhonchi.  Abdominal:     General: There is no distension.     Palpations: Abdomen is soft.     Tenderness: There is no abdominal tenderness. There is no right CVA tenderness, left CVA tenderness, guarding or rebound.  Musculoskeletal:        General: No tenderness.     Right lower leg: No edema.     Left lower leg: No edema.     Comments: Patient with lumbosacral discomfort to palpation as well as thoracolumbar paraspinous spasm  Lymphadenopathy:     Cervical: No cervical adenopathy.  Skin:    General: Skin is warm and dry.  Neurological:     General: No focal deficit present.     Mental Status: He is alert and oriented to [place], place, and time.  Psychiatric:        Mood and Affect: Mood normal.        Behavior: Behavior normal.        Thought Content: Thought content normal.        Judgment: Judgment normal.    BP 133/84 (BP Location: Left Arm, Patient Position: Sitting, Cuff Size: Normal)   Pulse 74   Temp 98.4 F (36.9 C) (Oral)   Resp 18   Ht 6\' 3"  (1.905 m)   Wt 243 lb (110.2 kg)    SpO2 96%   BMI 30.37 kg/m         Assessment & Plan:  1. Essential hypertension Patient's blood pressure appears to be well controlled with amlodipine and is very close to goal of 130/80.  Patient will continue current medication along with DASH diet.  Patient is limited in his ability to exercise secondary to his chronic back pain with radiation.  2. Recurrent major depression in partial remission [facility]) Patient reports improvement in his depression and anxiety but feels that he would benefit from an increase in his dose of Zoloft.  Patient's Zoloft will be increased from current 50 mg to 100 mg daily.  Patient had social work consult at his  last visit and patient has been encouraged to follow-up with mental health provider for further evaluation and treatment - sertraline (ZOLOFT) 100 MG tablet; Take 1 tablet (100 mg total) by mouth daily.  Dispense: 30 tablet; Refill: 3  3. Memory difficulty Patient with complaint of memory difficulty and I discussed with the patient that he does have several factors that could be affecting his memory.  Patient has been on chronic opioid therapy for back pain and opioid use can affect memory and concentration.  Patient has also had depression which can cause difficulty with memory.  He also has had issues with sleep and sleep deprivation can cause memory difficulty.  Patient will also be scheduled for sleep study for evaluation of sleep apnea which could also cause memory impairment.  4. Hypercholesterolemia Patient with history of hyperlipidemia and patient had lipid panel done in December with total cholesterol of 220 and LDL cholesterol of 142.  Patient has started the use of atorvastatin and discussed with the patient that his LDL goal is 70 or less as he has prediabetes with recent hemoglobin A1c of 6.1 and patient does not wish to be on medication at this time such as metformin to help with insulin resistance but wishes to make dietary changes.  Patient  has also had history of TIA and discussed the importance of patient continuing his use of statin medication.  Patient additionally has family history significant for CAD.  5. Gastroesophageal reflux disease, esophagitis presence not specified He reports improvement in reflux symptoms and his epigastric discomfort and mid chest discomfort have mostly resolved with the use of Nexium.  Patient will continue the use of this medication along with avoidance of known trigger foods and avoidance of late night eating.  6. Sleep disorder Patient with continued issues with nonrestorative sleep, snoring and patient with narrowed posterior airway and large neck size on exam.  Patient agrees to be scheduled for sleep study to be evaluated for sleep apnea or other sleep disorder which may be causing nonrestorative sleep - Split night study; Future  7. Dizziness Patient with complaint of issues with dizziness.  Patient is encouraged to remain well-hydrated as he states that his dizziness generally occurs with changes in position.  Patient may also have some dizziness related to his allergic rhinitis and patient will be placed on cetirizine.  Patient did have recent complete metabolic panel which did not show any electrolyte abnormality such as low potassium and low sodium which could contribute to patient's dizziness.  Patient also with a recent CBC which did not show anemia or other cause for patient's dizziness.  Patient's dizziness could also be related to his chronic opioid use.  8. Prediabetes Patient with recent hemoglobin A1c of 6.1 and patient wishes to try dietary changes but again discussed pathophysiology of diabetes including insulin resistance but patient is not interested in the use of metformin at this time.  Patient was made aware of symptoms of hyperglycemia and patient should return to clinic if he has continued issues with increased thirst, urinary frequency, blurred vision which might indicate that  blood sugars are remaining above normal.  Weight loss and exercise as tolerated encouraged.  9. Tobacco use Smoking cessation discussed with the patient.  He does not feel that he is ready at this time but patient was encouraged to consider slowly tapering the amount of cigarettes he smokes on a daily basis and go online and look in information from [place] quits.  Discussed with the patient that  there is a clinical pharmacist available to discuss and help with smoking cessation and medications for smoking cessation when patient is interested.  10. GAD (generalized anxiety disorder) Improved but still with suboptimal control as patient with continued anxiety and worry. Dose of sertraline increased to 100 mg once per day and addition of buspar 10 mg 3 x daily - sertraline (ZOLOFT) 100 MG tablet; Take 1 tablet (100 mg total) by mouth daily.  Dispense: 30 tablet; Refill: 3 - busPIRone (BUSPAR) 10 MG tablet; Take 1 tablet (10 mg total) by mouth 3 (three) times daily. To reduce anxiety  Dispense: 90 tablet; Refill: 3  11. Allergic rhinitis, unspecified seasonality, unspecified trigger Patient with complaint of allergic rhinitis with nasal congestion and postnasal drainage.  Prescription provided for cetirizine to take at bedtime but he was asked to start at 5 mg dose to help avoid oversedation. - cetirizine (ZYRTEC) 10 MG tablet; Take 1/2 to one pill once per day at bedtime as needed for nasal congestion  Dispense: 30 tablet; Refill: 6  An After Visit Summary was printed and given to the patient.  Allergies as of 08/22/2018      Reactions   Shellfish Allergy Nausea And Vomiting   Mussels only, can eat oysters   Penicillins Hives   Long-term use      Medication List       Accurate as of August 22, 2018 11:59 PM. Always use your most recent med list.        amLODipine 10 MG tablet Commonly known as:  NORVASC Take 1 tablet (10 mg total) by mouth daily.   aspirin EC 81 MG tablet Take 1 tablet  (81 mg total) by mouth daily.   atorvastatin 80 MG tablet Commonly known as:  LIPITOR Take 1 tablet (80 mg total) by mouth daily. To lower cholesterol   busPIRone 10 MG tablet Commonly known as:  BUSPAR Take 1 tablet (10 mg total) by mouth 3 (three) times daily. To reduce anxiety   cetirizine 10 MG tablet Commonly known as:  ZYRTEC Take 1/2 to one pill once per day at bedtime as needed for nasal congestion   esomeprazole 20 MG capsule Commonly known as:  NEXIUM Take 1 capsule (20 mg total) by mouth daily at 12 noon.   magnesium oxide 400 MG tablet Commonly known as:  MAG-OX Take 400 mg by mouth daily.   meloxicam 7.5 MG tablet Commonly known as:  MOBIC Take 1 tablet (7.5 mg total) by mouth 2 (two) times daily.   oxyCODONE-acetaminophen 10-325 MG tablet Commonly known as:  Percocet Take 1 tablet by mouth every 4 (four) hours as needed for pain.   sertraline 100 MG tablet Commonly known as:  ZOLOFT Take 1 tablet (100 mg total) by mouth daily.       Return in about 4 weeks (around 09/19/2018) for HTN/new medications/sleep; financial counselor appt. Labs at next visit

## 2018-08-23 ENCOUNTER — Encounter: Payer: Self-pay | Admitting: Family Medicine

## 2018-08-23 NOTE — Telephone Encounter (Signed)
Patient requesting sinus medication

## 2018-08-24 MED FILL — ATORVASTATIN 80 MG TABLET: 80 | 30 days supply | Qty: 30 | Fill #1

## 2018-08-24 MED FILL — AMLODIPINE BESYLATE 10 MG T: 10 | 30 days supply | Qty: 30 | Fill #1

## 2018-09-11 ENCOUNTER — Encounter: Payer: Self-pay | Admitting: Family Medicine

## 2018-10-05 ENCOUNTER — Other Ambulatory Visit: Payer: Self-pay

## 2018-10-05 ENCOUNTER — Ambulatory Visit: Payer: Self-pay | Admitting: Family Medicine

## 2018-10-08 MED FILL — AMLODIPINE BESYLATE 10 MG T: 10 | 30 days supply | Qty: 30 | Fill #2

## 2018-10-08 MED FILL — SERTRALINE HCL 100 MG TAB: 100 | 30 days supply | Qty: 30 | Fill #1

## 2018-10-08 MED FILL — busPIRone HCL 10 MG TABS: 10 | 30 days supply | Qty: 90 | Fill #1

## 2018-10-08 MED FILL — ATORVASTATIN 80 MG TABLET: 80 | 30 days supply | Qty: 30 | Fill #2

## 2018-10-08 MED FILL — ESOMEPRAZOLE MAGNESIUM 20 M: 20 | 30 days supply | Qty: 30 | Fill #1

## 2018-10-13 ENCOUNTER — Other Ambulatory Visit: Payer: Self-pay

## 2018-10-13 ENCOUNTER — Ambulatory Visit: Payer: Self-pay | Attending: Family Medicine | Admitting: Family Medicine

## 2018-10-13 ENCOUNTER — Encounter: Payer: Self-pay | Admitting: Family Medicine

## 2018-10-13 DIAGNOSIS — F411 Generalized anxiety disorder: Secondary | ICD-10-CM

## 2018-10-13 DIAGNOSIS — F3340 Major depressive disorder, recurrent, in remission, unspecified: Secondary | ICD-10-CM

## 2018-10-13 NOTE — Progress Notes (Signed)
Medication management medication he was put on during last visit.   Lower back pain-entire lower back pain  Per pt he stopped smoking 3wks from today

## 2018-10-13 NOTE — Progress Notes (Signed)
Virtual Visit via Telephone Note  I connected with Todd Ryan on 10/13/18 at  3:50 PM EDT by telephone and verified that I am speaking with the correct Todd Ryan using two identifiers.   Due to the current COVID-19 pandemic, office visits are being limited therefore today scheduled follow-up appointment was converted to a telehealth encounter.  I discussed the limitations, risks, security and privacy concerns of performing an evaluation and management service by telephone and the availability of in [place] appointments. I also discussed with the patient that there may be a patient responsible charge related to this service. The patient expressed understanding and agreed to proceed.  Patient location: home Provider location: office Emilio Aspen, RNA contacted patient and transferred call to my office; phone call took place between myself and patient   History of Present Illness:      58 year old male with history of chronic pain syndrome for which she has been on chronic opioid therapy per [place] [facility] and patient with hypertension who is been seen regarding anxiety with secondary insomnia.  Patient reports that he believes that the combination of sertraline 100 mg and 3 times daily BuSpar has greatly decreased his symptoms of anxiety/anxiousness.  Patient states that he now only will have symptoms about once per week where he will feel as if he is shaky/jittery and has interference with ability to fall asleep whereas previously this was occurring on a daily basis.  Currently, patient is not taking his pain medication as prescribed as he was denied recent pain management visit secondary to an outstanding balance of $200.  Patient states that he was additionally told that if he could obtain part of the balance that he could be sitting and patient states that he was able to get about 150 to take the full amount of pavement before he can be seen therefore patient has  been trying to stretch his use of pain medication so that he would not completely well.  Patient was counseled that running out of the medication that he has been on long-term may cause withdrawal symptoms and that he should seek medical attention if he completely runs out of his medication.  He denies any current depression, no suicidal thoughts or ideations.  Patient states that his blood pressure has been controlled on his current medications.  Past Medical History:  Diagnosis Date  . Asthma   . Back pain   . Peripheral neuropathy    Social History   Tobacco Use  . Smoking status: Former Smoker    Packs/day: 0.50    Types: Cigarettes  . Smokeless tobacco: Never Used  . Tobacco comment: 09-22-18 quit smoking  Substance Use Topics  . Alcohol use: Yes    Alcohol/week: 0.0 standard drinks    Comment: occas  . Drug use: No  Patient reports that he quit smoking 3 weeks ago Family History  Problem Relation Age of Onset  . Hypertension Mother   . CAD Father    Allergies  Allergen Reactions  . Shellfish Allergy Nausea And Vomiting    Mussels only, can eat oysters  . Penicillins Hives    Long-term use   Review of Systems  Constitutional: Positive for malaise/fatigue. Negative for chills and fever.  HENT: Negative for congestion and sore throat.   Respiratory: Negative for cough and shortness of breath.   Cardiovascular: Negative for chest pain and palpitations.  Gastrointestinal: Negative for abdominal pain, constipation, diarrhea and vomiting.  Genitourinary: Negative for dysuria and  frequency.  Musculoskeletal: Positive for back pain and myalgias.  Neurological: Negative for dizziness and headaches.  Psychiatric/Behavioral: Positive for memory loss (has noticed this since his injury). Negative for depression and suicidal ideas. The patient is nervous/anxious (improved) and has insomnia (improved).      Observations/Objective: No vital signs or physical exam done as visit was  conducted by telephone  Assessment and Plan: 1. GAD (generalized anxiety disorder) Patient reports that his anxiety and secondary sleep disorder related to anxiety and depression have greatly improved with the use of sertraline 100 mg and BuSpar 10 mg 3 times daily which patient will continue at this time.  Patient agrees to 72-month follow-up.  Patient is aware that he should call or return to clinic in the interim if he has any worsening of symptoms.  2. Recurrent major depression in remission [facility]) Patient at this time feels that he has some continued anxiety which is improved on his current medication but he denies depression at this time.  Patient is made aware however that if he should develop onset of suicidal thoughts or ideations that he should seek immediate medical evaluation.  Patient was congratulated on 3 weeks of smoking cessation.  Follow Up Instructions:Return in about 3 months (around 01/12/2019) for anxiety and chronic issues.    I discussed the assessment and treatment plan with the patient. The patient was provided an opportunity to ask questions and all were answered. The patient agreed with the plan and demonstrated an understanding of the instructions.   The patient was advised to call back or seek an in-[place] evaluation if the symptoms worsen or if the condition fails to improve as anticipated.  I provided 10  minutes of non-face-to-face time during this encounter.   Antony Blackbird, MD

## 2018-10-26 ENCOUNTER — Encounter (HOSPITAL_BASED_OUTPATIENT_CLINIC_OR_DEPARTMENT_OTHER): Payer: Self-pay

## 2018-12-02 ENCOUNTER — Other Ambulatory Visit: Payer: Self-pay

## 2018-12-02 ENCOUNTER — Other Ambulatory Visit (HOSPITAL_COMMUNITY)
Admission: RE | Admit: 2018-12-02 | Discharge: 2018-12-02 | Disposition: A | Discharge status: Home / Self Care | Payer: HRSA Program | Source: Ambulatory Visit | Attending: Internal Medicine | Admitting: Internal Medicine

## 2018-12-02 DIAGNOSIS — Z1159 Encounter for screening for other viral diseases: Secondary | ICD-10-CM | POA: Insufficient documentation

## 2018-12-02 DIAGNOSIS — Z01812 Encounter for preprocedural laboratory examination: Secondary | ICD-10-CM | POA: Diagnosis present

## 2018-12-02 LAB — SARS CORONAVIRUS 2 BY RT PCR (HOSPITAL ORDER, PERFORMED IN ~~LOC~~ HOSPITAL LAB): SARS Coronavirus 2: NEGATIVE

## 2018-12-05 ENCOUNTER — Other Ambulatory Visit: Payer: Self-pay

## 2018-12-05 ENCOUNTER — Ambulatory Visit (HOSPITAL_BASED_OUTPATIENT_CLINIC_OR_DEPARTMENT_OTHER): Payer: Self-pay | Attending: Family Medicine | Admitting: Internal Medicine

## 2018-12-05 VITALS — Ht 75.0 in | Wt 240.0 lb

## 2018-12-05 DIAGNOSIS — I10 Essential (primary) hypertension: Secondary | ICD-10-CM | POA: Insufficient documentation

## 2018-12-05 DIAGNOSIS — G4733 Obstructive sleep apnea (adult) (pediatric): Secondary | ICD-10-CM | POA: Insufficient documentation

## 2018-12-05 DIAGNOSIS — G479 Sleep disorder, unspecified: Secondary | ICD-10-CM

## 2018-12-05 DIAGNOSIS — G4710 Hypersomnia, unspecified: Secondary | ICD-10-CM | POA: Insufficient documentation

## 2018-12-11 DIAGNOSIS — G479 Sleep disorder, unspecified: Secondary | ICD-10-CM

## 2018-12-11 NOTE — Procedures (Signed)
Patient Name: Todd Ryan, Campos Jarian [place] Date: 12/05/2018 Gender: Male D.O.B: 02/19/1961 Age (years): 89 Referring Provider: Cammie Fulp Height (inches): 74 Interpreting Physician: Baird Lyons MD, ABSM Weight (lbs): 240 RPSGT: Carolin Coy BMI: 31 MRN: [id number] Neck Size: 16.00  CLINICAL INFORMATION Sleep Study Type: NPSG Indication for sleep study: Excessive Daytime Sleepiness, Fatigue, Hypertension, Snoring Epworth Sleepiness Score: 5  SLEEP STUDY TECHNIQUE As per the AASM Manual for the Scoring of Sleep and Associated Events v2.3 (April 2016) with a hypopnea requiring 4% desaturations.  The channels recorded and monitored were frontal, central and occipital EEG, electrooculogram (EOG), submentalis EMG (chin), nasal and oral airflow, thoracic and abdominal wall motion, anterior tibialis EMG, snore microphone, electrocardiogram, and pulse oximetry.  MEDICATIONS Medications self-administered by patient taken the night of the study : none reported  SLEEP ARCHITECTURE The study was initiated at 10:06:17 PM and ended at 4:33:20 AM.  Sleep onset time was 26.4 minutes and the sleep efficiency was 70.7%%. The total sleep time was 273.7 minutes.  Stage REM latency was 133.0 minutes.  The patient spent 38.2%% of the night in stage N1 sleep, 52.7%% in stage N2 sleep, 0.0%% in stage N3 and 9.1% in REM.  Alpha intrusion was absent.  Supine sleep was 14.98%.  RESPIRATORY PARAMETERS The overall apnea/hypopnea index (AHI) was 9.2 per hour. There were 2 total apneas, including 2 obstructive, 0 central and 0 mixed apneas. There were 40 hypopneas and 46 RERAs.  The AHI during Stage REM sleep was 52.8 per hour.  AHI while supine was 2.9 per hour.  The mean oxygen saturation was 92.0%. The minimum SpO2 during sleep was 87.0%.  moderate snoring was noted during this study.  CARDIAC DATA The 2 lead EKG demonstrated sinus rhythm. The mean heart rate was 58.0 beats per minute. Other  EKG findings include: None.  LEG MOVEMENT DATA The total PLMS were 0 with a resulting PLMS index of 0.0. Associated arousal with leg movement index was 0.0 .  IMPRESSIONS - Mild obstructive sleep apnea occurred during this study (AHI = 9.2/h). - There were insufficient early events to meet protocol criteria for split night CPAP titration. - No significant central sleep apnea occurred during this study (CAI = 0.0/h). - Mild oxygen desaturation was noted during this study (Min O2 = 87.0%). Mean sat 92%. - The patient snored with moderate snoring volume. - No cardiac abnormalities were noted during this study. - Clinically significant periodic limb movements did not occur during sleep. No significant associated arousals.  DIAGNOSIS - Obstructive Sleep Apnea (327.23 [G47.33 ICD-10])  RECOMMENDATIONS - Treatment for mild OSA is directed at symptoms. Conservative measures may include observation, weight loss, and sleep position off back.  - Other maeasures, including CPAP or a fitted oral appliance, might be considered based on clinical judgment. - Be careful with alcohol, sedatives and other CNS depressants that may worsen sleep apnea and disrupt normal sleep architecture. - Sleep hygiene should be reviewed to assess factors that may improve sleep quality. - Weight management and regular exercise should be initiated or continued if appropriate.  [Electronically signed] 12/11/2018 12:05 PM  Baird Lyons MD, [place], American Board of Sleep Medicine   NPI: [id number]                           [place] [place] [facility]  ELECTRONICALLY SIGNED ON:  12/11/2018, 12:01 PM [place] PH: [phone]   FX: [phone]  [place] [facility]

## 2018-12-13 ENCOUNTER — Telehealth: Payer: Self-pay | Admitting: Family Medicine

## 2018-12-13 NOTE — Telephone Encounter (Signed)
Patient called stating he would like to speak to PCP or RMA (did not disclose in regards to what)

## 2018-12-13 NOTE — Telephone Encounter (Signed)
Nurse called the patient's home phone number but received no answer and message was left on the voicemail for the patient to call back.  Return phone number given. 

## 2018-12-14 NOTE — Telephone Encounter (Signed)
At request of Dr Chapman Fitch, attempted to contact the patient # [phone] to inquire if he would prefer an oral appliance or CPAP as recommended from sleep study. Message was left requesting a call back to this CM # [phone].    If he has no insurance, he will need to pay out of pocket for the oral appliance as well as the appointment(s) for evaluation/fitting. If he needs the CPAP, he can apply for the hardship program with [employer] and may not need to pay anything,   Update sent to Dr Chapman Fitch

## 2018-12-15 NOTE — Telephone Encounter (Signed)
Can I order autopap with CPAP or do I need to give a specific range for autopap?

## 2018-12-15 NOTE — Telephone Encounter (Signed)
Patients call returned.  Patient identified by name and date of birth.  Patient explained options for which CPAP devise were available and what the costs could be,  Patient states he would like to get the mask.  Nurse explained both devises thoughly but Nurse is unsure if patient understood differences in devises.

## 2018-12-16 ENCOUNTER — Other Ambulatory Visit: Payer: Self-pay | Admitting: Family Medicine

## 2018-12-16 DIAGNOSIS — G4733 Obstructive sleep apnea (adult) (pediatric): Secondary | ICD-10-CM

## 2018-12-16 NOTE — Telephone Encounter (Signed)
Prescription has been left on your desk for CPAP and supplies

## 2018-12-16 NOTE — Progress Notes (Signed)
Patient ID: Todd Ryan, male   DOB: 1960-10-04, 58 y.o.   MRN: [id number]   Patient with sleep study done on 12/05/2018 showing mild obstructive sleep apnea with AHI of 9.2.  Patient was offered choice of CPAP versus oral appliance and patient would like to try CPAP.  Will place order for CPAP with auto titration and mask of choice

## 2018-12-19 ENCOUNTER — Other Ambulatory Visit: Payer: Self-pay | Admitting: Family Medicine

## 2018-12-19 ENCOUNTER — Telehealth: Payer: Self-pay

## 2018-12-19 DIAGNOSIS — G4733 Obstructive sleep apnea (adult) (pediatric): Secondary | ICD-10-CM

## 2018-12-19 NOTE — Telephone Encounter (Signed)
Order for APAP faxed to [facility]

## 2018-12-19 NOTE — Progress Notes (Signed)
Patient ID: Todd Ryan, male   DOB: 06-22-1961, 58 y.o.   MRN: [id number]   Patient needs new RX for CPAP containing autopap range.

## 2018-12-19 NOTE — Telephone Encounter (Signed)
New RX printed with range for CPAP

## 2018-12-21 ENCOUNTER — Telehealth: Payer: Self-pay

## 2018-12-21 NOTE — Telephone Encounter (Signed)
Ok, I was not aware of this type of insurance. Do you think it is COBRA insurance?

## 2018-12-21 NOTE — Telephone Encounter (Addendum)
Call received from patient stating that he received a call from a company about his CPAP machine.  He told them that he is not working but hopes to soon receive disability.  He said that they told him that he would have to pay $1000.  Informed him that this CM would contact [facility]  Call placed to [facility], spoke to [place] and explained call from patient and inquired if he would be eligible for their hardship program because he has no income and is unemployed   She stated that his demographic information indicates that he has [employer].  She will need to check with manager and call back.  Call received from [place], she explained that this insurance is for individuals who had COVID and will cover all necessary treatments and equipment /supplies, so it will cover the CPAP.  She said that she would call and inform patient.

## 2018-12-22 NOTE — Telephone Encounter (Signed)
Call received from [facility]. She was requesting a provider's note that specifically addressed the need for a sleep study.  Visit note from 08/22/2018 faxed as requested.

## 2018-12-22 NOTE — Telephone Encounter (Signed)
ok 

## 2018-12-29 ENCOUNTER — Telehealth: Payer: Self-pay

## 2018-12-29 NOTE — Telephone Encounter (Signed)
Call placed to [facility] to check on status of CPAP order. Spoke to [place] who stated that they emailed him the hardship application and are waiting to hear back from him to determine what he would qualify for

## 2019-01-10 ENCOUNTER — Telehealth: Payer: Self-pay

## 2019-01-10 NOTE — Telephone Encounter (Signed)
Call placed to [facility], spoke to [place] who stated that the patient has been approved for the hardship program and is scheduled to pick up his CPAP on 01/19/2019 @ 1500.

## 2019-02-19 ENCOUNTER — Emergency Department (HOSPITAL_COMMUNITY)
Admission: EM | Admit: 2019-02-19 | Discharge: 2019-02-19 | Disposition: A | Discharge status: Home / Self Care | Payer: Self-pay | Attending: Emergency Medicine | Admitting: Emergency Medicine

## 2019-02-19 ENCOUNTER — Other Ambulatory Visit: Payer: Self-pay

## 2019-02-19 ENCOUNTER — Emergency Department (HOSPITAL_COMMUNITY): Payer: Self-pay

## 2019-02-19 ENCOUNTER — Encounter (HOSPITAL_COMMUNITY): Payer: Self-pay | Admitting: Emergency Medicine

## 2019-02-19 DIAGNOSIS — M25511 Pain in right shoulder: Secondary | ICD-10-CM | POA: Insufficient documentation

## 2019-02-19 DIAGNOSIS — Y929 Unspecified place or not applicable: Secondary | ICD-10-CM | POA: Insufficient documentation

## 2019-02-19 DIAGNOSIS — J45909 Unspecified asthma, uncomplicated: Secondary | ICD-10-CM | POA: Insufficient documentation

## 2019-02-19 DIAGNOSIS — M5442 Lumbago with sciatica, left side: Secondary | ICD-10-CM | POA: Insufficient documentation

## 2019-02-19 DIAGNOSIS — W010XXA Fall on same level from slipping, tripping and stumbling without subsequent striking against object, initial encounter: Secondary | ICD-10-CM | POA: Insufficient documentation

## 2019-02-19 DIAGNOSIS — Y999 Unspecified external cause status: Secondary | ICD-10-CM | POA: Insufficient documentation

## 2019-02-19 DIAGNOSIS — Y9301 Activity, walking, marching and hiking: Secondary | ICD-10-CM | POA: Insufficient documentation

## 2019-02-19 DIAGNOSIS — G8929 Other chronic pain: Secondary | ICD-10-CM

## 2019-02-19 DIAGNOSIS — I10 Essential (primary) hypertension: Secondary | ICD-10-CM | POA: Insufficient documentation

## 2019-02-19 DIAGNOSIS — Z79899 Other long term (current) drug therapy: Secondary | ICD-10-CM | POA: Insufficient documentation

## 2019-02-19 DIAGNOSIS — M5441 Lumbago with sciatica, right side: Secondary | ICD-10-CM | POA: Insufficient documentation

## 2019-02-19 DIAGNOSIS — W19XXXA Unspecified fall, initial encounter: Secondary | ICD-10-CM

## 2019-02-19 DIAGNOSIS — Z87891 Personal history of nicotine dependence: Secondary | ICD-10-CM | POA: Insufficient documentation

## 2019-02-19 HISTORY — DX: Anxiety disorder, unspecified: F41.9

## 2019-02-19 MED ORDER — NAPROXEN 375 MG PO TABS: 375.0000 mg | ORAL_TABLET | Freq: Two times a day (BID) | ORAL | 0 refills | Status: DC

## 2019-02-19 MED ORDER — NAPROXEN 375 MG PO TABS: 375.0000 mg | ORAL_TABLET | Freq: Two times a day (BID) | ORAL | 0 refills | Status: AC

## 2019-02-19 NOTE — ED Notes (Signed)
EDP at bedside  

## 2019-02-19 NOTE — ED Triage Notes (Signed)
Pt. Stated, I fell a week ago on my rt. Shoulder and its not getting any better.

## 2019-02-19 NOTE — ED Provider Notes (Signed)
[facility] EMERGENCY DEPARTMENT Provider Note   CSN: [id number] Arrival date & time: 02/19/19  1045     History   Chief Complaint Chief Complaint  Patient presents with  . Shoulder Injury    HPI Todd Ryan is a 58 y.o. male.     HPI   Patient is a 58 year old male with a history of anxiety, asthma, back pain, peripheral neuropathy, who presents to the emergency department today for evaluation after a fall.  Patient states he was walking in his left lower extremity gave out on him about a week ago.  States that this occurs intermittently as a result of his chronic back pain.  States he fell onto his right side.  He caught himself with his right hand and then landed on his right shoulder.  Since then he has had right shoulder pain.  He has somewhat decreased range of motion secondary to pain in the right shoulder.  Denies he had head trauma or LOC.  He is stating that his lower back pain seems somewhat worse since the fall.  He has chronic numbness in his bilateral toes which has been present for a year.  States that for the last several months he has had intermittent numbness on the right and left legs that seems to alternate.  Also has paresthesias and pain to these areas.  States he has had some episodes of stress incontinence over the last 4 months but has been able to feel the urge to urinate and control his urine since then.  He has had no persistent bowel incontinence.  He has been ambulatory without difficulty.  Denies any persistent numbness/weakness.  He has not followed up with his neurosurgeon.  Past Medical History:  Diagnosis Date  . Anxiety   . Asthma   . Back pain   . Peripheral neuropathy     Patient Active Problem List   Diagnosis Date Noted  . TIA (transient ischemic attack) 06/02/2018  . Hypertension 06/02/2018  . Lower back injury 12/07/2013    History reviewed. No pertinent surgical history.      Home Medications    Prior to  Admission medications   Medication Sig Start Date End Date Taking? Authorizing Provider  amLODipine (NORVASC) 10 MG tablet Take 1 tablet (10 mg total) by mouth daily. 07/08/18   Fulp, Cammie, MD  atorvastatin (LIPITOR) 80 MG tablet Take 1 tablet (80 mg total) by mouth daily. To lower cholesterol 07/08/18   Fulp, Cammie, MD  busPIRone (BUSPAR) 10 MG tablet Take 1 tablet (10 mg total) by mouth 3 (three) times daily. To reduce anxiety 08/22/18   Fulp, Cammie, MD  cetirizine (ZYRTEC) 10 MG tablet Take 1/2 to one pill once per day at bedtime as needed for nasal congestion 08/22/18   Fulp, Cammie, MD  esomeprazole (NEXIUM) 20 MG capsule Take 1 capsule (20 mg total) by mouth daily at 12 noon. 07/08/18   Fulp, Cammie, MD  magnesium oxide (MAG-OX) 400 MG tablet Take 400 mg by mouth daily.    [provider]  meloxicam (MOBIC) 7.5 MG tablet Take 1 tablet (7.5 mg total) by mouth 2 (two) times daily. 06/15/18   Argentina Donovan, PA-C  naproxen (NAPROSYN) 375 MG tablet Take 1 tablet (375 mg total) by mouth 2 (two) times daily for 5 days. 02/19/19 02/24/19  Pegi Milazzo S, PA-C  oxyCODONE-acetaminophen (PERCOCET) 10-325 MG per tablet Take 1 tablet by mouth every 4 (four) hours as needed for pain. Patient  taking differently: Take 1 tablet by mouth 3 (three) times daily.  12/22/14   Pamella Pert, MD  sertraline (ZOLOFT) 100 MG tablet Take 1 tablet (100 mg total) by mouth daily. 08/22/18   Antony Blackbird, MD    Family History Family History  Problem Relation Age of Onset  . Hypertension Mother   . CAD Father     Social History Social History   Tobacco Use  . Smoking status: Former Smoker    Packs/day: 0.50    Types: Cigarettes  . Smokeless tobacco: Never Used  . Tobacco comment: 09-22-18 quit smoking  Substance Use Topics  . Alcohol use: Yes    Alcohol/week: 0.0 standard drinks    Comment: occas  . Drug use: No     Allergies   Shellfish allergy and Penicillins   Review of Systems  Review of Systems  Constitutional: Negative for fever.  HENT: Negative for ear pain.   Respiratory: Negative for shortness of breath.   Cardiovascular: Negative for chest pain.  Gastrointestinal: Negative for abdominal pain, constipation, diarrhea, nausea and vomiting.       No persistent loss of bowel control  Genitourinary: Negative for dysuria.       Stress incontinence  Musculoskeletal: Positive for back pain.       Right shoulder pain  Skin: Negative for rash.  Neurological: Positive for weakness (intermittent) and numbness (intermittent).  All other systems reviewed and are negative.   Physical Exam Updated Vital Signs BP (!) 144/85 (BP Location: Right Arm)   Pulse 64   Temp 97.8 F (36.6 C) (Oral)   Resp 16   SpO2 98%   Physical Exam Vitals signs and nursing note reviewed.  Constitutional:      Appearance: He is well-developed.  HENT:     Head: Normocephalic and atraumatic.  Eyes:     Conjunctiva/sclera: Conjunctivae normal.  Neck:     Musculoskeletal: Neck supple.  Cardiovascular:     Rate and Rhythm: Normal rate and regular rhythm.     Heart sounds: No murmur.  Pulmonary:     Effort: Pulmonary effort is normal. No respiratory distress.     Breath sounds: Normal breath sounds.  Abdominal:     Palpations: Abdomen is soft.     Tenderness: There is no abdominal tenderness.  Musculoskeletal:     Comments: No TTP to the cervical or thoracic spine.  TTP to the lumbar spine which is chronic but somewhat worse today.  5/5 strength to the bilateral lower extremities.  Normal sensation throughout bilateral lower extremities with exception of mildly decreased sensation to the lateral left foot.  Ambulatory with steady gait.  No limp.  TTP to the anterior right shoulder.  Pain with crossover test.  Unable to fully abduct or flex shoulder secondary to pain.  Skin:    General: Skin is warm and dry.  Neurological:     Mental Status: He is alert.      ED Treatments /  Results  Labs (all labs ordered are listed, but only abnormal results are displayed) Labs Reviewed - No data to display  EKG None  Radiology Dg Lumbar Spine Complete  Result Date: 02/19/2019 CLINICAL DATA:  Pt had a fall 2 days ago onto right shoulder. Since the fall pt has had continued right shoulder pain as well as lower back pain. No hx of chronic pain in either shoulder or back. EXAM: LUMBAR SPINE - COMPLETE 4+ VIEW COMPARISON:  12/05/2013 FINDINGS: Degenerative changes are seen in  the LOWER lumbar spine. No acute fracture or subluxation. No suspicious lytic or blastic lesions are identified. Visualized bowel gas pattern is nonobstructive. IMPRESSION: No evidence for acute abnormality. Electronically Signed   By: Nolon Nations M.D.   On: 02/19/2019 12:00   Dg Shoulder Right  Result Date: 02/19/2019 CLINICAL DATA:  Right shoulder pain status post fall EXAM: RIGHT SHOULDER - 2+ VIEW COMPARISON:  None. FINDINGS: There is no fracture or dislocation. The glenohumeral joint space is maintained. There are mild degenerative changes of the acromioclavicular joint. IMPRESSION: No acute osseous injury of the right shoulder. Electronically Signed   By: Kathreen Devoid   On: 02/19/2019 11:59    Procedures Procedures (including critical care time)  Medications Ordered in ED Medications - No data to display   Initial Impression / Assessment and Plan / ED Course  I have reviewed the triage vital signs and the nursing notes.  Pertinent labs & imaging results that were available during my care of the patient were reviewed by me and considered in my medical decision making (see chart for details).     Final Clinical Impressions(s) / ED Diagnoses   Final diagnoses:  Fall, initial encounter  Acute pain of right shoulder  Chronic midline low back pain with bilateral sciatica   59 year old male presenting for evaluation of fall which occurred 1 week ago.  Complaining of low back pain and right  shoulder pain from the fall.  No head trauma or LOC.  Does have some tenderness to palpation of the right shoulder.  X-ray of the shoulder is negative  Also with tenderness to the lumbar spine with negative x-ray.  Strength and sensation are grossly normal.  Does have some chronic sensory changes which are unchanged today.  Clinical picture does not support cauda equina however do feel the patient will need to follow-up with his neurosurgeon in regards to his chronic back pain.  He voices understanding of this.  We will also give him anti-inflammatories and follow-up with orthopedics for his shoulder injury.  We will give him sling for comfort.  Advised return the ER for new or worsening symptoms.  He voiced understanding is agreement plan.  Questions answered.  Patient stable discharge.  ED Discharge Orders         Ordered    naproxen (NAPROSYN) 375 MG tablet  2 times daily     02/19/19 [address] PA-C 02/19/19 1227    Lajean Saver, MD 02/19/19 1550

## 2019-02-19 NOTE — Discharge Instructions (Addendum)
You may alternate taking Tylenol and Ibuprofen as needed for pain control. You may take 400-600 mg of ibuprofen every 6 hours and [phone] mg of Tylenol every 6 hours. Do not exceed 4000 mg of Tylenol daily as this can lead to liver damage. Also, make sure to take Ibuprofen with meals as it can cause an upset stomach. Do not take other NSAIDs while taking Ibuprofen such as (Aleve, Naprosyn, Aspirin, Celebrex, etc) and do not take more than the prescribed dose as this can lead to ulcers and bleeding in your GI tract. You may use warm and cold compresses to help with your symptoms.   Please follow up with your primary doctor within the next 7-10 days for re-evaluation and further treatment of your symptoms.   Return to the emergency department immediately if you experience any back pain associated with fevers, loss of control of your bowels/bladder, weakness/numbness to your legs, numbness to your groin area, inability to walk, or inability to urinate.

## 2019-02-19 NOTE — ED Notes (Signed)
Patient verbalized understanding of discharge instructions and denies any further needs or questions at this time. VS stable. Patient ambulatory with steady gait.  

## 2019-02-19 NOTE — ED Notes (Signed)
Patient transported to x-ray. ?

## 2019-02-24 ENCOUNTER — Encounter: Payer: Self-pay | Admitting: Family Medicine

## 2019-02-24 ENCOUNTER — Ambulatory Visit: Payer: Self-pay | Attending: Family Medicine | Admitting: Family Medicine

## 2019-02-24 DIAGNOSIS — Z599 Problem related to housing and economic circumstances, unspecified: Secondary | ICD-10-CM

## 2019-02-24 DIAGNOSIS — G4733 Obstructive sleep apnea (adult) (pediatric): Secondary | ICD-10-CM

## 2019-02-24 DIAGNOSIS — M25511 Pain in right shoulder: Secondary | ICD-10-CM

## 2019-02-24 DIAGNOSIS — Z598 Other problems related to housing and economic circumstances: Secondary | ICD-10-CM

## 2019-02-24 NOTE — Progress Notes (Signed)
Virtual Visit via Telephone Note  I connected with Brook Anchondo on 03/12/19 at  2:10 PM EDT by telephone and verified that I am speaking with the correct Todd Ryan using two identifiers.   I discussed the limitations, risks, security and privacy concerns of performing an evaluation and management service by telephone and the availability of in [place] appointments. I also discussed with the patient that there may be a patient responsible charge related to this service. The patient expressed understanding and agreed to proceed.  Patient Location: Home Provider Location: Office at [place] Others participating in call: Call initiated by [place], [place] who then transferred the call to me   History of Present Illness:      58 yo male with chronic pain syndrome secondary to past work injury and patient with chronic low back pain with history of L4-5 disc herniation/degenerative disc disease for which he has had back surgery in the past.  Today, patient complains of continued chronic right shoulder pain.  He feels that he has decreased ability to lift his right arm without onset of sharp/shooting pain and he cannot sleep on his right side.  Shoulder pain ranges from a 6 to a 9 or 10.  Patient states that he is unable to afford follow-up of his shoulder pain at this time.  He states that he is having financial difficulty being unable to work due to his chronic pain issues and states that he is essentially homeless but has been able to live with different friends.  He does not currently have a permanent residence.  He does still have a car and is afraid that he may end up having to live in his car if he can no longer stay with friends.  He denies any suicidal thoughts or ideations and he continues to take medications to help with chronic depression and anxiety.        He reports that he is using the CPAP for obstructive sleep apnea and he believes that this has helped however he feels that there is some air  leakage and he is not getting the proper amount of oxygen when he is asleep and he would like to be scheduled for a sleep study to actually titrate his CPAP as he believes that the current issues he is having with the CPAP machine are related to the AutoPap setting not being at the correct setting.         On additional review of systems, he denies chest pain or palpitations, no shortness of breath or cough, no nausea/vomiting or abdominal pain.  No dysuria or frequency.  He does have fatigue.  Past Medical History:  Diagnosis Date  . Anxiety   . Asthma   . Back pain   . Peripheral neuropathy     Past Surgical History:  Procedure Laterality Date  . LUMBAR SPINE SURGERY      Family History  Problem Relation Age of Onset  . Hypertension Mother   . CAD Father     Social History   Tobacco Use  . Smoking status: Former Smoker    Packs/day: 0.50    Types: Cigarettes  . Smokeless tobacco: Never Used  . Tobacco comment: 09-22-18 quit smoking  Substance Use Topics  . Alcohol use: Yes    Alcohol/week: 0.0 standard drinks    Comment: occas  . Drug use: No     Allergies  Allergen Reactions  . Shellfish Allergy Nausea And Vomiting    Mussels only, can  eat oysters  . Penicillins Hives    Long-term use       Observations/Objective: No vital signs or physical exam conducted as visit was done via telephone  Assessment and Plan: 1. Acute pain of right shoulder Referral placed to orthopedics for continued follow-up of patient's acute on chronic right shoulder pain. - AMB referral to orthopedics  2. OSA (obstructive sleep apnea) Referral placed for CPAP titration as patient does not believe that his current AutoPap settings are correct and he feels that he is not getting the proper amount of oxygen through his CPAP at night. - Cpap titration; Future  3. Financial difficulty Patient has longstanding history of chronic depression and generalized anxiety disorder and now has financial  difficulty which he believes may cause him to become homeless.  Patient is currently having to live with friends.  Patient has been referred stat to medical social worker whom he does agree to talk to when she contacts him likely today or tomorrow.  He is aware that if he has any suicidal thoughts or ideations that he should go to the emergency department for further evaluation and treatment. - Ambulatory referral to Social Work  Follow Up Instructions:Return in about 4 weeks (around 03/24/2019) for Follow-up anxiety/depression.    I discussed the assessment and treatment plan with the patient. The patient was provided an opportunity to ask questions and all were answered. The patient agreed with the plan and demonstrated an understanding of the instructions.   The patient was advised to call back or seek an in[place] evaluation if the symptoms worsen or if the condition fails to improve as anticipated.  I provided 15 minutes of non-face-to-face time during this encounter.   Antony Blackbird, MD

## 2019-02-24 NOTE — Progress Notes (Signed)
Patient verified DOB Patient has taken medication today. Patient has eaten today. Patient complains of chronic back pain and acute shoulder pain at a 5. Patient need titration ordered to receive settings for cpap.

## 2019-02-28 MED FILL — ATORVASTATIN 80 MG TABLET: 80 | 30 days supply | Qty: 30 | Fill #3

## 2019-02-28 MED FILL — ?ESOMEPRAZOLE MAG DR 20MG C: 20 | 30 days supply | Qty: 30 | Fill #2

## 2019-02-28 MED FILL — SERTRALINE HCL 100 MG TAB: 100 | 30 days supply | Qty: 30 | Fill #2

## 2019-02-28 MED FILL — ?AMLODIPINE BESYLATE 10 MG: 10 | 30 days supply | Qty: 30 | Fill #3

## 2019-02-28 MED FILL — ?busPIRONE HCL 10MG TABLETS: 10 | 30 days supply | Qty: 90 | Fill #2

## 2019-03-01 ENCOUNTER — Encounter: Payer: Self-pay | Admitting: Licensed Clinical Social Worker

## 2019-03-01 NOTE — Progress Notes (Signed)
Call placed to patient. LCSW introduced self and explained role at [facility]. Pt was informed of IBH referral from PCP to address housing and financial difficulties.   Pt reports that he has difficulty managing medical conditions due financial strain. He states inability to refill medications in approximately two months. Pt shared that he was able to obtain medications after recent appointment with PCP and he has a scheduled appointment with Financial Counseling scheduled for 03/10/2019.   Pt receives strong support from family (2 sisters and 2 brothers) who he will rotate staying with. At times, he will also sleep in his vehicle. Pt participates in medication management to cope with anxiety and depression.   LCSW provided support and validation. Pt was informed of various community resources to assist with psychosocial stressors. Pt was appreciative for the follow up call.   Pt requested information regarding how to obtain prescription from PCP to adjust settings for his CPAP machine. LCSW will follow up with patient after consulting with PCP and RN CM, [place]. No additional concerns noted.

## 2019-03-10 ENCOUNTER — Ambulatory Visit: Payer: Self-pay | Attending: Family Medicine

## 2019-03-10 ENCOUNTER — Other Ambulatory Visit: Payer: Self-pay

## 2019-03-12 ENCOUNTER — Encounter: Payer: Self-pay | Admitting: Family Medicine

## 2019-03-23 ENCOUNTER — Telehealth: Payer: Self-pay | Admitting: Family Medicine

## 2019-03-23 NOTE — Telephone Encounter (Signed)
Pt was sent a letter from financial dept. Inform them, that the application they submitted was incomplete, since they were missing some documentation at the time of the appointment, Pt need to reschedule and resubmit all new papers and application for [employer]FA and [place], P.S. old documents has been sent back by mail to the Pt and Pt. need to make a new appt. 

## 2019-03-27 ENCOUNTER — Other Ambulatory Visit (HOSPITAL_COMMUNITY)
Admission: RE | Admit: 2019-03-27 | Discharge: 2019-03-27 | Disposition: A | Discharge status: Home / Self Care | Payer: HRSA Program | Source: Ambulatory Visit | Attending: Internal Medicine | Admitting: Internal Medicine

## 2019-03-27 DIAGNOSIS — Z20828 Contact with and (suspected) exposure to other viral communicable diseases: Secondary | ICD-10-CM | POA: Insufficient documentation

## 2019-03-27 DIAGNOSIS — Z01812 Encounter for preprocedural laboratory examination: Secondary | ICD-10-CM | POA: Insufficient documentation

## 2019-03-28 LAB — NOVEL CORONAVIRUS, NAA (HOSP ORDER, SEND-OUT TO REF LAB; TAT 18-24 HRS): SARS-CoV-2, NAA: NOT DETECTED

## 2019-03-30 ENCOUNTER — Other Ambulatory Visit: Payer: Self-pay

## 2019-03-30 ENCOUNTER — Ambulatory Visit (HOSPITAL_BASED_OUTPATIENT_CLINIC_OR_DEPARTMENT_OTHER): Payer: Worker's Compensation | Attending: Family Medicine | Admitting: Internal Medicine

## 2019-03-30 DIAGNOSIS — G4733 Obstructive sleep apnea (adult) (pediatric): Secondary | ICD-10-CM | POA: Insufficient documentation

## 2019-04-01 DIAGNOSIS — G4733 Obstructive sleep apnea (adult) (pediatric): Secondary | ICD-10-CM

## 2019-04-01 NOTE — Procedures (Signed)
Patient Name: Todd Ryan, Todd Ryan Date: 03/30/2019 Gender: Male D.O.B: 1960-11-04 Age (years): 43 Referring Provider: Cammie Fulp Height (inches): 73 Interpreting Physician: Baird Lyons MD, ABSM Weight (lbs): 240 RPSGT: Baxter Flattery BMI: 32 MRN: [id number] Neck Size: 17.00  CLINICAL INFORMATION The patient is referred for a CPAP titration to treat sleep apnea.  Date of NPSG, Split Night or HST:   NPSG 12/05/2018   AHI 9.2/ hr, desaturation to 87%, bodyweight 240 lbs  SLEEP STUDY TECHNIQUE As per the AASM Manual for the Scoring of Sleep and Associated Events v2.3 (April 2016) with a hypopnea requiring 4% desaturations.  The channels recorded and monitored were frontal, central and occipital EEG, electrooculogram (EOG), submentalis EMG (chin), nasal and oral airflow, thoracic and abdominal wall motion, anterior tibialis EMG, snore microphone, electrocardiogram, and pulse oximetry. Continuous positive airway pressure (CPAP) was initiated at the beginning of the study and titrated to treat sleep-disordered breathing.  MEDICATIONS Medications self-administered by patient taken the night of the study : NAPROXEN  TECHNICIAN COMMENTS Comments added by technician: PATIENT TOLERATED THE MASK WELL Comments added by scorer: N/A RESPIRATORY PARAMETERS Optimal PAP Pressure (cm): 10 AHI at Optimal Pressure (/hr): 0.0 Overall Minimal O2 (%): 86.0 Supine % at Optimal Pressure (%): 0 Minimal O2 at Optimal Pressure (%): 91.0   SLEEP ARCHITECTURE The study was initiated at 11:02:45 PM and ended at 5:06:47 AM.  Sleep onset time was 7.2 minutes and the sleep efficiency was 90.7%%. The total sleep time was 330.4 minutes.  The patient spent 4.4%% of the night in stage N1 sleep, 73.4%% in stage N2 sleep, 0.0%% in stage N3 and 22.3% in REM.Stage REM latency was 69.5 minutes  Wake after sleep onset was 26.5. Alpha intrusion was absent. Supine sleep was 43.46%.  CARDIAC DATA The 2 lead EKG  demonstrated sinus rhythm. The mean heart rate was 62.5 beats per minute. Other EKG findings include: None.  LEG MOVEMENT DATA The total Periodic Limb Movements of Sleep (PLMS) were 0. The PLMS index was 0.0. A PLMS index of <15 is considered normal in adults.  IMPRESSIONS - The optimal PAP pressure was 10 cm of water. - Central sleep apnea was not noted during this titration (CAI = 0.0/h). - Moderate oxygen desaturations were observed during this titration (min O2 = 86.0%). Minimum saturaation on CPAP 10 was 91%. - No snoring was audible during this study. - No cardiac abnormalities were observed during this study. - Clinically significant periodic limb movements were not noted during this study. Arousals associated with PLMs were rare.  DIAGNOSIS - Obstructive Sleep Apnea (327.23 [G47.33 ICD-10])  RECOMMENDATIONS - Trial of CPAP therapy on 10 cm H2O or auopap 5-15. - Patient used a Standard size Resmed Nasal Mask AirFit N30I mask and heated humidification. - Be careful with alcohol, sedatives and other CNS depressants that may worsen sleep apnea and disrupt normal sleep architecture. - Sleep hygiene should be reviewed to assess factors that may improve sleep quality. - Weight management and regular exercise should be initiated or continued.  [Electronically signed] 04/01/2019 11:30 AM  Baird Lyons MD, ABSM Diplomate, American Board of Sleep Medicine   NPI: [id number]                          [place], [place] of Sleep [facility]  ELECTRONICALLY SIGNED ON:  04/01/2019, 11:27 AM [place] PH: [phone]   FX: [phone]) [place]  OF SLEEP MEDICINE

## 2019-04-04 ENCOUNTER — Telehealth: Payer: Self-pay | Admitting: *Deleted

## 2019-04-04 ENCOUNTER — Other Ambulatory Visit: Payer: Self-pay | Admitting: Family Medicine

## 2019-04-04 MED ORDER — CLINDAMYCIN HCL 300 MG PO CAPS: 300.0000 mg | ORAL_CAPSULE | Freq: Two times a day (BID) | ORAL | 0 refills | Status: DC

## 2019-04-04 MED ORDER — CLINDAMYCIN HCL 300 MG PO CAPS: 300.0000 mg | ORAL_CAPSULE | Freq: Two times a day (BID) | ORAL | 0 refills | Status: AC

## 2019-04-04 NOTE — Telephone Encounter (Signed)
LMOM informing pt that medication is ready for pick up per previous discussion.

## 2019-04-04 NOTE — Progress Notes (Signed)
Patient ID: Todd Ryan, male   DOB: 03-Mar-1961, 58 y.o.   MRN: [id number]  Patient left phone message requesting an antibiotic due to tooth infection and [facility]

## 2019-04-04 NOTE — Telephone Encounter (Signed)
Notify patient that RX is at [facility] well as [facility] for Clindamycin for tooth infection

## 2019-04-04 NOTE — Telephone Encounter (Signed)
Spoke with patient and he stated he have a tooth ache and it is infected and would like an antibiotic. Per pt this has happened before in the past.

## 2019-04-17 ENCOUNTER — Telehealth (INDEPENDENT_AMBULATORY_CARE_PROVIDER_SITE_OTHER): Payer: Self-pay

## 2019-04-17 NOTE — Telephone Encounter (Signed)
Order to change CPAP setting to 10 cm H2O faxed to [facility]

## 2019-04-19 ENCOUNTER — Telehealth: Payer: Self-pay

## 2019-04-19 NOTE — Telephone Encounter (Signed)
Call placed to [facility] to check on status of order to change CPAP setting  Spoke to [place] who stated that they received the order and the setting was changed to 10 cm H2O on 04/17/2019.

## 2019-05-02 ENCOUNTER — Telehealth: Payer: Self-pay | Admitting: Family Medicine

## 2019-05-02 ENCOUNTER — Telehealth: Payer: Self-pay

## 2019-05-02 NOTE — Telephone Encounter (Signed)
Pt called requesting help with his C-Pap machine, states that the medical supply store in [place] is requesting his device back and he does not have one. Please advise

## 2019-05-02 NOTE — Telephone Encounter (Signed)
Call returned to patient regarding CPAP questions.  Message left with call back requested to this CM # [phone]

## 2019-05-04 NOTE — Telephone Encounter (Signed)
Attempted again to contact patient regarding his CPAP machine # [phone], message left with call back requested to this CM # [phone]

## 2019-05-23 ENCOUNTER — Telehealth: Payer: Self-pay

## 2019-05-23 NOTE — Telephone Encounter (Signed)
Call placed to patient to inquire about his CPAP machine. He said that he has tried to call this clinic multiple times but has not been able to get through.  He explained that he received the CPAP machine free through the [facility] hardship program.  He said that he received a call from someone at the company that told him he is not using the CPAP machine enough and they may take it away or he would need to pay for it.  He said that he is using it 4 hours/night and feels that it is helping his sleep but the reports that the company receives shows that he is not using it enough.  He said that he has no insurance and has been turned down for medicaid 3 times and is not able to work.   Informed him that this CM would contact [facility] to inquire more about his CPAP use concerns.

## 2019-05-24 ENCOUNTER — Telehealth: Payer: Self-pay

## 2019-05-24 NOTE — Telephone Encounter (Signed)
Call received from [place].  She explained that as per [facility] usage reports, the patient has been averaging CPAP use of 3 hours/night. This was from 01/19/2019 - 04/20/2019.  Compliance is considered at least 4 hours/night and she said that this was explained to the patient when he picked up his machine.  She further explained that if a patient is not compliant for 3 months, they may remove the machine from the home or request that the patient pay for the machine in full.   She said that this has been explained to the patient as well.  She noted that she will send the compliance report to this office.  Informed her that this information would be shared with PCP.   Call placed to the patient and explained above.  He said that many times he thinks he is using the CPAP all night but when he wakes up ,the mask is off his face and he did not realize that he had removed it.  He said he received 4 different masks to try when he completed his most recent sleep study. He was encouraged to try to use the machine at least for 4 hours/night.  Sleep hygiene was reviewed. He said that he sleeps best when he has something to help him sleep - beer, cold medicine. He was cautioned against this,  Informed him that Dr Chapman Fitch would be notified of this situation with the CPAP usage.

## 2019-05-24 NOTE — Telephone Encounter (Signed)
Call placed to [facility] regarding patient's concerns about his CPAP. Spoke to Vicente Males who took message to have Todd Ryan return the call to this CM

## 2019-05-26 NOTE — Telephone Encounter (Signed)
I am not sure if he mentioned this to you but he has also been having issues with housing and apparently has been sleeping in his car at times which would of course also affect his ability to use his CPAP. Please make family medicine aware as well

## 2019-06-01 NOTE — Telephone Encounter (Signed)
Call placed to [facility], spoke to Marion and informed her that the compliance report has not been received.  Also asked her to inform Freda Munro that the patient has been having difficulties with housing and has been sleeping in his car at times which may affect compliance.   She said that she would notify Freda Munro.

## 2019-07-05 ENCOUNTER — Ambulatory Visit: Payer: Self-pay | Attending: Family Medicine | Admitting: Family Medicine

## 2019-07-05 ENCOUNTER — Other Ambulatory Visit: Payer: Self-pay

## 2019-07-05 ENCOUNTER — Encounter: Payer: Self-pay | Admitting: Family Medicine

## 2019-07-05 VITALS — BP 179/105 | HR 63 | Resp 16 | Wt 255.8 lb

## 2019-07-05 DIAGNOSIS — G8929 Other chronic pain: Secondary | ICD-10-CM

## 2019-07-05 DIAGNOSIS — F411 Generalized anxiety disorder: Secondary | ICD-10-CM

## 2019-07-05 DIAGNOSIS — R05 Cough: Secondary | ICD-10-CM

## 2019-07-05 DIAGNOSIS — G4733 Obstructive sleep apnea (adult) (pediatric): Secondary | ICD-10-CM

## 2019-07-05 DIAGNOSIS — M25511 Pain in right shoulder: Secondary | ICD-10-CM

## 2019-07-05 DIAGNOSIS — J0110 Acute frontal sinusitis, unspecified: Secondary | ICD-10-CM

## 2019-07-05 DIAGNOSIS — M545 Low back pain, unspecified: Secondary | ICD-10-CM

## 2019-07-05 DIAGNOSIS — I10 Essential (primary) hypertension: Secondary | ICD-10-CM

## 2019-07-05 DIAGNOSIS — J3089 Other allergic rhinitis: Secondary | ICD-10-CM

## 2019-07-05 DIAGNOSIS — R7303 Prediabetes: Secondary | ICD-10-CM

## 2019-07-05 DIAGNOSIS — F3340 Major depressive disorder, recurrent, in remission, unspecified: Secondary | ICD-10-CM

## 2019-07-05 DIAGNOSIS — Z8673 Personal history of transient ischemic attack (TIA), and cerebral infarction without residual deficits: Secondary | ICD-10-CM

## 2019-07-05 DIAGNOSIS — S3992XD Unspecified injury of lower back, subsequent encounter: Secondary | ICD-10-CM

## 2019-07-05 DIAGNOSIS — R053 Chronic cough: Secondary | ICD-10-CM

## 2019-07-05 LAB — POCT GLYCOSYLATED HEMOGLOBIN (HGB A1C): HbA1c, POC (prediabetic range): 6.3 % (ref 5.7–6.4)

## 2019-07-05 MED ORDER — MELOXICAM 7.5 MG PO TABS: ORAL_TABLET | ORAL | 4 refills | Status: DC

## 2019-07-05 MED ORDER — SULFAMETHOXAZOLE-TRIMETHOPRIM 800-160 MG PO TABS: 1.0000 | ORAL_TABLET | Freq: Two times a day (BID) | ORAL | 0 refills | Status: AC

## 2019-07-05 MED ORDER — AMLODIPINE BESYLATE 10 MG PO TABS: 10.0000 mg | ORAL_TABLET | Freq: Every day | ORAL | 1 refills | Status: AC

## 2019-07-05 MED FILL — ?SULFAMETHOXAZOLE-TMP DS TB: 800-160 | 10 days supply | Qty: 20 | Fill #0

## 2019-07-05 MED FILL — MELOXICAM 7.5 MG TABLET: 7.5 | 30 days supply | Qty: 60 | Fill #0

## 2019-07-05 MED FILL — AMLODIPINE BESYLATE 10 MG T: 10 | 30 days supply | Qty: 30 | Fill #0

## 2019-07-05 NOTE — Progress Notes (Signed)
Pt states his pain is coming from lower back and right shoulder

## 2019-07-05 NOTE — Progress Notes (Signed)
Established Patient Office Visit  Subjective:  Patient ID: Todd Ryan, male    DOB: July 28, 1960  Age: 59 y.o. MRN: [id number]  CC:  Chief Complaint  Patient presents with  . Back Pain  . Shoulder Pain    HPI Todd Ryan, 59 year old African-American male, who presents secondary to continued, chronic low back and right shoulder pain.  Pain increases with activity/movement of the right arm/shoulder area.  Pain is between a 6-8 on a 0-to-10 scale and sometimes greater depending on level of activity.       He continues to take his medication for control of hypertension and denies any headaches or dizziness related to his blood pressure.  He reports that he tries to use the CPAP but sometimes when he awakens, he has accidentally removed his CPAP mask.  He is currently having some issues with nasal congestion and a frontal headache and he believes that he may have a sinus infection and he reports that congestion does make it harder to use the CPAP.  He continues to have issues with dry, nonproductive cough and recently cough has been worse at night.  He denies any fever or chills.         He continues to take medication to help with anxiety and depression.  He has found more stable housing.  He is agreeable to speak with social worker regarding his issues with anxiety and depression and to receive help to set up a mental health professional.  Past Medical History:  Diagnosis Date  . Anxiety   . Asthma   . Back pain   . Peripheral neuropathy     Past Surgical History:  Procedure Laterality Date  . LUMBAR SPINE SURGERY      Family History  Problem Relation Age of Onset  . Hypertension Mother   . CAD Father     Social History   Socioeconomic History  . Marital status: Single    Spouse name: Not on file  . Number of children: Not on file  . Years of education: Not on file  . Highest education level: Not on file  Occupational History  . Not on file  Tobacco Use  .  Smoking status: Former Smoker    Packs/day: 0.50    Types: Cigarettes  . Smokeless tobacco: Never Used  . Tobacco comment: 09-22-18 quit smoking  Substance and Sexual Activity  . Alcohol use: Yes    Alcohol/week: 0.0 standard drinks    Comment: occas  . Drug use: No  . Sexual activity: Yes  Other Topics Concern  . Not on file  Social History Narrative  . Not on file   Social Determinants of Health   Financial Resource Strain:   . Difficulty of Paying Living Expenses: Not on file  Food Insecurity:   . Worried About Charity fundraiser in the Last Year: Not on file  . Ran Out of Food in the Last Year: Not on file  Transportation Needs:   . Lack of Transportation (Medical): Not on file  . Lack of Transportation (Non-Medical): Not on file  Physical Activity:   . Days of Exercise per Week: Not on file  . Minutes of Exercise per Session: Not on file  Stress:   . Feeling of Stress : Not on file  Social Connections:   . Frequency of Communication with Friends and Family: Not on file  . Frequency of Social Gatherings with Friends and Family: Not on file  . Attends  Religious Services: Not on file  . Active Member of Clubs or Organizations: Not on file  . Attends Archivist Meetings: Not on file  . Marital Status: Not on file  Intimate Partner Violence:   . Fear of Current or Ex-Partner: Not on file  . Emotionally Abused: Not on file  . Physically Abused: Not on file  . Sexually Abused: Not on file    Outpatient Medications Prior to Visit  Medication Sig Dispense Refill  . amLODipine (NORVASC) 10 MG tablet Take 1 tablet (10 mg total) by mouth daily. 90 tablet 1  . atorvastatin (LIPITOR) 80 MG tablet Take 1 tablet (80 mg total) by mouth daily. To lower cholesterol 30 tablet 6  . busPIRone (BUSPAR) 10 MG tablet Take 1 tablet (10 mg total) by mouth 3 (three) times daily. To reduce anxiety 90 tablet 3  . cetirizine (ZYRTEC) 10 MG tablet Take 1/2 to one pill once per day at  bedtime as needed for nasal congestion 30 tablet 6  . esomeprazole (NEXIUM) 20 MG capsule Take 1 capsule (20 mg total) by mouth daily at 12 noon. 30 capsule 6  . magnesium oxide (MAG-OX) 400 MG tablet Take 400 mg by mouth daily.    . meloxicam (MOBIC) 7.5 MG tablet Take 1 tablet (7.5 mg total) by mouth 2 (two) times daily. (Patient not taking: Reported on 07/05/2019) 60 tablet 1  . sertraline (ZOLOFT) 100 MG tablet Take 1 tablet (100 mg total) by mouth daily. 30 tablet 3   No facility-administered medications prior to visit.    Allergies  Allergen Reactions  . Shellfish Allergy Nausea And Vomiting    Mussels only, can eat oysters  . Penicillins Hives    Long-term use    ROS Review of Systems  Constitutional: Positive for fatigue. Negative for chills and fever.  HENT: Positive for postnasal drip and sinus pressure. Negative for sore throat and trouble swallowing.   Eyes: Negative for photophobia and visual disturbance.  Respiratory: Positive for cough (chronic). Negative for shortness of breath.   Cardiovascular: Negative for chest pain and palpitations.  Gastrointestinal: Positive for constipation. Negative for abdominal pain, blood in stool, diarrhea and nausea.  Endocrine: Negative for cold intolerance, heat intolerance, polydipsia, polyphagia and polyuria.  Genitourinary: Negative for dysuria, flank pain, frequency and hematuria.  Musculoskeletal: Positive for arthralgias and back pain.  Neurological: Positive for headaches (frontal headache associated with sinus congestion). Negative for dizziness.  Hematological: Negative for adenopathy. Does not bruise/bleed easily.  Psychiatric/Behavioral: Negative for self-injury and suicidal ideas. The patient is nervous/anxious.       Objective:    Physical Exam  Constitutional: He is oriented to [place], place, and time. He appears well-developed and well-nourished. No distress.  Well-nourished well-developed overweight male in no acute  distress wearing face mask as per office COVID-19 protocol  Neck: No JVD present.  Cardiovascular: Normal rate and regular rhythm.  Pulmonary/Chest: Effort normal and breath sounds normal.  Abdominal: Soft. There is no abdominal tenderness. There is no rebound and no guarding.  Musculoskeletal:        General: Tenderness present. No edema.     Cervical back: Normal range of motion.     Comments: No CVA tenderness, patient with lumbosacral discomfort to palpation and marked thoracolumbar paraspinous spasm  Lymphadenopathy:    He has no cervical adenopathy.  Neurological: He is alert and oriented to [place], place, and time.  Skin: Skin is warm and dry.  Psychiatric: He has a normal  mood and affect. His behavior is normal.  Nursing note and vitals reviewed.   BP (!) 179/105   Pulse 63   Resp 16   Wt 255 lb 12.8 oz (116 kg)   SpO2 98%   BMI 31.97 kg/m  Wt Readings from Last 3 Encounters:  07/05/19 255 lb 12.8 oz (116 kg)  03/30/19 240 lb (108.9 kg)  12/05/18 240 lb (108.9 kg)     Health Maintenance Due  Topic Date Due  . Hepatitis C Screening  18-Jan-1961  . COLONOSCOPY  12/05/2012      No results found for: TSH Lab Results  Component Value Date   WBC 6.2 06/02/2018   HGB 13.6 06/02/2018   HCT 43.8 06/02/2018   MCV 94.8 06/02/2018   PLT 185 06/02/2018   Lab Results  Component Value Date   NA 140 06/02/2018   K 4.4 06/02/2018   CO2 26 06/02/2018   GLUCOSE 115 (H) 06/02/2018   BUN 14 06/02/2018   CREATININE 1.01 06/02/2018   BILITOT 0.6 06/02/2018   ALKPHOS 51 06/02/2018   AST 26 06/02/2018   ALT 23 06/02/2018   PROT 7.7 06/02/2018   ALBUMIN 3.8 06/02/2018   CALCIUM 9.0 06/02/2018   ANIONGAP 8 06/02/2018   Lab Results  Component Value Date   CHOL 220 (H) 06/02/2018   Lab Results  Component Value Date   HDL 54 06/02/2018   Lab Results  Component Value Date   LDLCALC 142 (H) 06/02/2018   Lab Results  Component Value Date   TRIG 121 06/02/2018    Lab Results  Component Value Date   CHOLHDL 4.1 06/02/2018   Lab Results  Component Value Date   HGBA1C 6.1 (H) 06/02/2018      Assessment & Plan:  1. Essential hypertension Amlodipine refilled as patient out of medication and BP elevated at today's visit. Medication compliance discussed with patient to help prevent complications. Patient encouraged to monitor his  blood pressure.  - amLODipine (NORVASC) 10 MG tablet; Take 1 tablet (10 mg total) by mouth daily. For blood pressure  Dispense: 90 tablet; Refill: 1  2. OSA (obstructive sleep apnea) Discussed importance of compliance with nightly use of CPAP as his elevated blood pressure may improve with CPAP use.   3. Prediabetes Hgb A1c repeated today due to patient's A1c of 6.1 on 06/02/2018 - HgB A1c  4. Acute frontal sinusitis, recurrence not specified RX for Bacrim DS twice daily for 10 days and encouraged the use of otc mucinex and antihistamine. Avoid use of decongestants which can increase the blood pressure - sulfamethoxazole-trimethoprim (BACTRIM DS) 800-160 MG tablet; Take 1 tablet by mouth 2 (two) times daily for 10 days.  Dispense: 20 tablet; Refill: 0  5. Perennial allergic rhinitis Use of otc antihistamine and steroid nasal spray encouraged  6. Chronic cough May be related to allergic rhinitis however he is also encouraged to notify office if no improvement as he may need a CXR and further evaluation  7. History of TIA (transient ischemic attack) Importance of secondary prevention measures such as controlling blood pressure and lipids stressed  8. Chronic right shoulder pain 9. Chronic midline low back pain, unspecified whether sciatica present 10. Injury of low back, subsequent encounter Make sure that application completed for [facility] discount so that ongoing follow-up with Orthopedics can continue and RX provided for Mobic to help with pain. - meloxicam (MOBIC) 7.5 MG tablet; One or two pills once daily after a  meal as needed for  pain  Dispense: 60 tablet; Refill: 4  11. Recurrent major depression in remission ([place]); 12. GAD-Generalized Anxiety Disorder Continue buspar as well as referral to social work to help patient establish with further mental health resources - Ambulatory referral to Social Work   .An After Visit Summary was printed and given to the patient.  Follow-up: Return in about 6 weeks (around 08/16/2019) for cough/chronic issues/HTN.   Antony Blackbird, MD

## 2019-07-05 NOTE — Patient Instructions (Signed)
Diabetes Basics  Diabetes (diabetes mellitus) is a long-term (chronic) disease. It occurs when the body does not properly use sugar (glucose) that is released from food after you eat. Diabetes may be caused by one or both of these problems:  Your pancreas does not make enough of a hormone called insulin.  Your body does not react in a normal way to insulin that it makes. Insulin lets sugars (glucose) go into cells in your body. This gives you energy. If you have diabetes, sugars cannot get into cells. This causes high blood sugar (hyperglycemia). Follow these instructions at home: How is diabetes treated? You may need to take insulin or other diabetes medicines daily to keep your blood sugar in balance. Take your diabetes medicines every day as told by your doctor. List your diabetes medicines here: Diabetes medicines  Name of medicine: ______________________________ ? Amount (dose): _______________ Time (a.m./p.m.): _______________ Notes: ___________________________________  Name of medicine: ______________________________ ? Amount (dose): _______________ Time (a.m./p.m.): _______________ Notes: ___________________________________  Name of medicine: ______________________________ ? Amount (dose): _______________ Time (a.m./p.m.): _______________ Notes: ___________________________________ If you use insulin, you will learn how to give yourself insulin by injection. You may need to adjust the amount based on the food that you eat. List the types of insulin you use here: Insulin  Insulin type: ______________________________ ? Amount (dose): _______________ Time (a.m./p.m.): _______________ Notes: ___________________________________  Insulin type: ______________________________ ? Amount (dose): _______________ Time (a.m./p.m.): _______________ Notes: ___________________________________  Insulin type: ______________________________ ? Amount (dose): _______________ Time (a.m./p.m.):  _______________ Notes: ___________________________________  Insulin type: ______________________________ ? Amount (dose): _______________ Time (a.m./p.m.): _______________ Notes: ___________________________________  Insulin type: ______________________________ ? Amount (dose): _______________ Time (a.m./p.m.): _______________ Notes: ___________________________________ How do I manage my blood sugar?  Check your blood sugar levels using a blood glucose monitor as directed by your doctor. Your doctor will set treatment goals for you. Generally, you should have these blood sugar levels:  Before meals (preprandial): 80-130 mg/dL (4.4-7.2 mmol/L).  After meals (postprandial): below 180 mg/dL (10 mmol/L).  A1c level: less than 7%. Write down the times that you will check your blood sugar levels: Blood sugar checks  Time: _______________ Notes: ___________________________________  Time: _______________ Notes: ___________________________________  Time: _______________ Notes: ___________________________________  Time: _______________ Notes: ___________________________________  Time: _______________ Notes: ___________________________________  Time: _______________ Notes: ___________________________________  What do I need to know about low blood sugar? Low blood sugar is called hypoglycemia. This is when blood sugar is at or below 70 mg/dL (3.9 mmol/L). Symptoms may include:  Feeling: ? Hungry. ? Worried or nervous (anxious). ? Sweaty and clammy. ? Confused. ? Dizzy. ? Sleepy. ? Sick to your stomach (nauseous).  Having: ? A fast heartbeat. ? A headache. ? A change in your vision. ? Tingling or no feeling (numbness) around the mouth, lips, or tongue. ? Jerky movements that you cannot control (seizure).  Having trouble with: ? Moving (coordination). ? Sleeping. ? Passing out (fainting). ? Getting upset easily (irritability). Treating low blood sugar To treat low blood  sugar, eat or drink something sugary right away. If you can think clearly and swallow safely, follow the 15:15 rule:  Take 15 grams of a fast-acting carb (carbohydrate). Talk with your doctor about how much you should take.  Some fast-acting carbs are: ? Sugar tablets (glucose pills). Take 3-4 glucose pills. ? 6-8 pieces of hard candy. ? 4-6 oz (120-150 mL) of fruit juice. ? 4-6 oz (120-150 mL) of regular (not diet) soda. ? 1 Tbsp (15 mL) honey or sugar.    Check your blood sugar 15 minutes after you take the carb.  If your blood sugar is still at or below 70 mg/dL (3.9 mmol/L), take 15 grams of a carb again.  If your blood sugar does not go above 70 mg/dL (3.9 mmol/L) after 3 tries, get help right away.  After your blood sugar goes back to normal, eat a meal or a snack within 1 hour. Treating very low blood sugar If your blood sugar is at or below 54 mg/dL (3 mmol/L), you have very low blood sugar (severe hypoglycemia). This is an emergency. Do not wait to see if the symptoms will go away. Get medical help right away. Call your local emergency services (911 in the [place] Do not drive yourself to the hospital. Questions to ask your health care provider  Do I need to meet with a diabetes educator?  What equipment will I need to care for myself at home?  What diabetes medicines do I need? When should I take them?  How often do I need to check my blood sugar?  What number can I call if I have questions?  When is my next doctor's visit?  Where can I find a support group for people with diabetes? Where to find more information  American Diabetes Association: www.diabetes.org  American Association of Diabetes Educators: www.diabeteseducator.org/patient-resources Contact a doctor if:  Your blood sugar is at or above 240 mg/dL (13.3 mmol/L) for 2 days in a row.  You have been sick or have had a fever for 2 days or more, and you are not getting better.  You have any of these  problems for more than 6 hours: ? You cannot eat or drink. ? You feel sick to your stomach (nauseous). ? You throw up (vomit). ? You have watery poop (diarrhea). Get help right away if:  Your blood sugar is lower than 54 mg/dL (3 mmol/L).  You get confused.  You have trouble: ? Thinking clearly. ? Breathing. Summary  Diabetes (diabetes mellitus) is a long-term (chronic) disease. It occurs when the body does not properly use sugar (glucose) that is released from food after digestion.  Take insulin and diabetes medicines as told.  Check your blood sugar every day, as often as told.  Keep all follow-up visits as told by your doctor. This is important. This information is not intended to replace advice given to you by your health care provider. Make sure you discuss any questions you have with your health care provider. Document Revised: 03/08/2019 Document Reviewed: 09/17/2017 Elsevier Patient Education  2020 [place].  Preventing Type 2 Diabetes Mellitus Type 2 diabetes (type 2 diabetes mellitus) is a long-term (chronic) disease that affects blood sugar (glucose) levels. Normally, a hormone called insulin allows glucose to enter cells in the body. The cells use glucose for energy. In type 2 diabetes, one or both of these problems may be present:  The body does not make enough insulin.  The body does not respond properly to insulin that it makes (insulin resistance). Insulin resistance or lack of insulin causes excess glucose to build up in the blood instead of going into cells. As a result, high blood glucose (hyperglycemia) develops, which can cause many complications. Being overweight or obese and having an inactive (sedentary) lifestyle can increase your risk for diabetes. Type 2 diabetes can be delayed or prevented by making certain nutrition and lifestyle changes. What nutrition changes can be made?   Eat healthy meals and snacks regularly. Keep a healthy  snack with you  for when you get hungry between meals, such as fruit or a handful of nuts.  Eat lean meats and proteins that are low in saturated fats, such as chicken, fish, egg whites, and beans. Avoid processed meats.  Eat plenty of fruits and vegetables and plenty of grains that have not been processed (whole grains). It is recommended that you eat: ? 1?2 cups of fruit every day. ? 2?3 cups of vegetables every day. ? 6?8 oz of whole grains every day, such as oats, whole wheat, bulgur, brown rice, quinoa, and millet.  Eat low-fat dairy products, such as milk, yogurt, and cheese.  Eat foods that contain healthy fats, such as nuts, avocado, olive oil, and canola oil.  Drink water throughout the day. Avoid drinks that contain added sugar, such as soda or sweet tea.  Follow instructions from your health care provider about specific eating or drinking restrictions.  Control how much food you eat at a time (portion size). ? Check food labels to find out the serving sizes of foods. ? Use a kitchen scale to weigh amounts of foods.  Saute or steam food instead of frying it. Cook with water or broth instead of oils or butter.  Limit your intake of: ? Salt (sodium). Have no more than 1 tsp (2,400 mg) of sodium a day. If you have heart disease or high blood pressure, have less than ? tsp (1,500 mg) of sodium a day. ? Saturated fat. This is fat that is solid at room temperature, such as butter or fat on meat. What lifestyle changes can be made? Activity   Do moderate-intensity physical activity for at least 30 minutes on at least 5 days of the week, or as much as told by your health care provider.  Ask your health care provider what activities are safe for you. A mix of physical activities may be best, such as walking, swimming, cycling, and strength training.  Try to add physical activity into your day. For example: ? Park in spots that are farther away than usual, so that you walk more. For example,  park in a far corner of the parking lot when you go to the office or the grocery store. ? Take a walk during your lunch break. ? Use stairs instead of elevators or escalators. Weight Loss  Lose weight as directed. Your health care provider can determine how much weight loss is best for you and can help you lose weight safely.  If you are overweight or obese, you may be instructed to lose at least 5?7 % of your body weight. Alcohol and Tobacco   Limit alcohol intake to no more than 1 drink a day for nonpregnant women and 2 drinks a day for men. One drink equals 12 oz of beer, 5 oz of wine, or 1 oz of hard liquor.  Do not use any tobacco products, such as cigarettes, chewing tobacco, and e-cigarettes. If you need help quitting, ask your health care provider. Work With [facility] Provider  Have your blood glucose tested regularly, as told by your health care provider.  Discuss your risk factors and how you can reduce your risk for diabetes.  Get screening tests as told by your health care provider. You may have screening tests regularly, especially if you have certain risk factors for type 2 diabetes.  Make an appointment with a diet and nutrition specialist (registered dietitian). A registered dietitian can help you make a healthy eating plan and  can help you understand portion sizes and food labels. Why are these changes important?  It is possible to prevent or delay type 2 diabetes and related health problems by making lifestyle and nutrition changes.  It can be difficult to recognize signs of type 2 diabetes. The best way to avoid possible damage to your body is to take actions to prevent the disease before you develop symptoms. What can happen if changes are not made?  Your blood glucose levels may keep increasing. Having high blood glucose for a long time is dangerous. Too much glucose in your blood can damage your blood vessels, heart, kidneys, nerves, and eyes.  You may  develop prediabetes or type 2 diabetes. Type 2 diabetes can lead to many chronic health problems and complications, such as: ? Heart disease. ? Stroke. ? Blindness. ? Kidney disease. ? Depression. ? Poor circulation in the feet and legs, which could lead to surgical removal (amputation) in severe cases. Where to find support  Ask your health care provider to recommend a registered dietitian, diabetes educator, or weight loss program.  Look for local or online weight loss groups.  Join a gym, fitness club, or outdoor activity group, such as a walking club. Where to find more information To learn more about diabetes and diabetes prevention, visit:  American Diabetes Association (ADA): www.diabetes.[employer] of Diabetes and Digestive and Kidney Diseases: FindSpin.nl To learn more about healthy eating, visit:  The U.S. Department of Agriculture Scientist, research (physical sciences)), Choose My Plate: http://wiley-williams.com/  Office of Disease Prevention and Health Promotion (ODPHP), Dietary Guidelines: SurferLive.at Summary  You can reduce your risk for type 2 diabetes by increasing your physical activity, eating healthy foods, and losing weight as directed.  Talk with your health care provider about your risk for type 2 diabetes. Ask about any blood tests or screening tests that you need to have. This information is not intended to replace advice given to you by your health care provider. Make sure you discuss any questions you have with your health care provider. Document Revised: 10/07/2018 Document Reviewed: 08/06/2015 Elsevier Patient Education  [place].

## 2019-07-14 ENCOUNTER — Telehealth: Payer: Self-pay | Admitting: Family Medicine

## 2019-07-14 NOTE — Telephone Encounter (Signed)
Patient called to inform pcp that he is currently unable to wear his cpap at night,  due to being so congested which is causing it to be harder for him to breathe. Patient stated [facility] will call [facility] patient and threaten to take the machine if he doesn't wear it for a certain amount of hours. Please fu at your earliest convenience.

## 2019-07-17 ENCOUNTER — Other Ambulatory Visit: Payer: Self-pay | Admitting: Family Medicine

## 2019-07-17 DIAGNOSIS — R0981 Nasal congestion: Secondary | ICD-10-CM

## 2019-07-17 MED ORDER — FLUTICASONE PROPIONATE 50 MCG/ACT NA SUSP: 2.0000 | Freq: Every day | NASAL | 6 refills | Status: AC

## 2019-07-17 MED FILL — FLUTICASONE PROP 50 MCG SPR: 50 | 30 days supply | Qty: 16 | Fill #0

## 2019-07-17 NOTE — Progress Notes (Signed)
Patient ID: Todd Ryan, male   DOB: [birth year], 59 y.o.   MRN: [id number]   Patient with complaint of inability to use his CPAP due to nasal congestion.  He currently has a prescription for cetirizine.  We will add prescription for Flonase nasal spray and refer to ENT for further evaluation

## 2019-07-17 NOTE — Telephone Encounter (Signed)
Please ask if patient is getting any relief of his congestion with the use of cetirizine which was previously prescribed.  If not, prescription can also be sent to his pharmacy for Flonase nasal spray.  He may also wish to try over-the-counter Breathe Right nasal strips to see if this helps with the congestion and allows him to use his CPAP.  I can also place a referral for patient to see an ear nose and throat doctor regarding his congestion issues

## 2019-07-17 NOTE — Telephone Encounter (Signed)
Let patient know ear nose and throat referral placed and prescription sent to pharmacy for Flonase and he can also take previously prescribed cetirizine to help with his nasal congestion

## 2019-07-18 NOTE — Telephone Encounter (Signed)
Left message on voicemail for patient return call

## 2019-07-19 NOTE — Telephone Encounter (Signed)
If you would let patient know as well

## 2019-07-20 ENCOUNTER — Telehealth: Payer: Self-pay

## 2019-07-20 NOTE — Telephone Encounter (Signed)
Call placed to the patient and informed him that this CM spoke to [facility] and they will not be taking his CPAP machine away but he should try to increase compliance as much as possible

## 2019-07-27 ENCOUNTER — Telehealth: Payer: Self-pay | Admitting: Licensed Clinical Social Worker

## 2019-07-27 NOTE — Telephone Encounter (Signed)
Call placed to patient regarding [facility] referral. Patient shared that he has continued to stay with family and will occasionally spend the night in his vehicle to prevent "overstaying his welcome"   Pt feels that his depression and anxiety symptoms have been managed well and he is continuing to comply with medication management.   LCSW inquired about his recent application for the CAFA. Pt shared that his original documents were returned to him via mail due to incomplete application. LCSW provided patient with process to obtain a Non Filing Status Letter from the IRS, which is needed to complete application.   No additional concerns noted. Pt was encouraged to contact LCSW for any additional support.

## 2019-08-16 ENCOUNTER — Telehealth: Payer: Self-pay | Admitting: *Deleted

## 2019-08-16 NOTE — Telephone Encounter (Signed)
Antony Blackbird, MD  Carilyn Goodpasture, RN  Please contact patient regarding his nasal congestion. Make sure that he is using the prescription steroid nasal spray as other otc nasal sprays can cause rebound nasal congestion. Additionally, a referral was made for the patient to ENT- see if this has been scheduled so that you can give the patient details regarding ENT follow-up. If he is current using a CPAP with nasal prongs, it may be possible for him to be fitted with a different mask    Needs to apply for [facility] for ENT. ENT specialist in [place] do not take patient w/o insurance. This is the not in ENT referral-   Pt don't have insurance And [facility] or [facility] don't have ENT in the network Mailed a letter to patient with the option to go to [facility]100 or [facility] $ 65.00 and apply for financial with them Waiting for patient response.   LMOVM relaying this information per DPR.  Advised patient to return call for clarity or additional questions.

## 2019-08-17 ENCOUNTER — Encounter: Payer: Self-pay | Admitting: Family Medicine

## 2019-08-17 ENCOUNTER — Other Ambulatory Visit: Payer: Self-pay

## 2019-08-17 ENCOUNTER — Ambulatory Visit: Payer: Self-pay | Attending: Family Medicine | Admitting: Family Medicine

## 2019-08-17 DIAGNOSIS — M545 Low back pain, unspecified: Secondary | ICD-10-CM

## 2019-08-17 DIAGNOSIS — Z202 Contact with and (suspected) exposure to infections with a predominantly sexual mode of transmission: Secondary | ICD-10-CM

## 2019-08-17 DIAGNOSIS — R300 Dysuria: Secondary | ICD-10-CM

## 2019-08-17 MED ORDER — METRONIDAZOLE 500 MG PO TABS: 500.0000 mg | ORAL_TABLET | Freq: Two times a day (BID) | ORAL | 0 refills | Status: DC

## 2019-08-17 MED ORDER — PREDNISONE 20 MG PO TABS: ORAL_TABLET | ORAL | 0 refills | Status: DC

## 2019-08-17 NOTE — Progress Notes (Signed)
Virtual Visit via Telephone Note  I connected with Gianpiero Lamons on 08/17/19 at  8:30 AM EST by telephone and verified that I am speaking with the correct [place] using two identifiers.   I discussed the limitations, risks, security and privacy concerns of performing an evaluation and management service by telephone and the availability of in [place] appointments. I also discussed with the patient that there may be a patient responsible charge related to this service. The patient expressed understanding and agreed to proceed.  Patient Location: Home Provider Location: [facility] office Others participating in call: none   History of Present Illness:        59 yo male with complaint of recent acute on chronic pain in his lower back with radiation to the left leg.  Pain radiates all the way to his toes and causes numbness and tingling.  Pain in his mid lower back and leg is sharp.  Currently he does not have any pain as he is sitting still but when he gets up or starts walking he has increased pain that is about a 7 on a 0-to-10 scale.  He does not recall any inciting factors for his current pain.  Current increased pain has been occurring for the past 3 to 4 days.  He is currently taking medication that he has at home including oxycodone and meloxicam with decrease in pain.  Area of current pain is similar to area where he has issues with chronic low back pain.  He denies any bladder or bowel dysfunction related to his back pain but he has developed recent urinary symptoms.          He reports that he is having issues as well with burning with urination and he was recently contacted by his ex-wife to let them know that he may have been exposed to a sexually transmitted infection.  Patient cannot recall the name of the infection but states that he was told that he would need to ask his doctor to prescribe an antibiotic.  Patient spelled out the name of the antibiotic which is metronidazole.  Patient denies  any current lower abdominal pain, just burning sensation with urination with recent onset.  No current fever or chills, no nausea or vomiting.  He does have fatigue related to his back pain.   Past Medical History:  Diagnosis Date  . Anxiety   . Asthma   . Back pain   . Peripheral neuropathy     Past Surgical History:  Procedure Laterality Date  . LUMBAR SPINE SURGERY      Family History  Problem Relation Age of Onset  . Hypertension Mother   . CAD Father     Social History   Tobacco Use  . Smoking status: Former Smoker    Packs/day: 0.25    Types: Cigarettes  . Smokeless tobacco: Never Used  . Tobacco comment: 09-22-18 quit smoking  Substance Use Topics  . Alcohol use: Yes    Alcohol/week: 0.0 standard drinks    Comment: occas  . Drug use: No     Allergies  Allergen Reactions  . Shellfish Allergy Nausea And Vomiting    Mussels only, can eat oysters  . Penicillins Hives    Long-term use       Observations/Objective: No vital signs or physical exam conducted as visit was done via telephone  Assessment and Plan: 1. Low back pain with radiation He has had longstanding issues with low back pain with radiation but has  had recent acute flareup of pain in the same area.  He is currently taking medication that he has at home.  Prescription provided for prednisone taper as this should help with his acute on chronic pain.  Patient declines prescription for muscle relaxant at this time.  Patient should call or return next week if his symptoms have not improved. - predniSONE (DELTASONE) 20 MG tablet; Take 3 pills today then 2 pills daily for 2 days, 1 pill daily for 2 days then 1/2 pill daily for 4 days; take after eating  Dispense: 11 tablet; Refill: 0  2. Dysuria; 3.  Exposure to sexually transmitted infection/disease Patient with complaint of dysuria and was recently informed that he was likely exposed to a sexually transmitted disease with his ex-wife.  Patient reports that  he was told that he needed to take metronidazole for the infection.  Prescription will be sent to patient's pharmacy.  He was made aware that he cannot drink alcohol while on this medication as it will cause nausea and vomiting.  If he continues to have dysuria he should follow-up next week for testing for UTI/sexually transmitted infection/disease. - metroNIDAZOLE (FLAGYL) 500 MG tablet; Take 1 tablet (500 mg total) by mouth 2 (two) times daily.  Dispense: 14 tablet; Refill: 0   Follow Up Instructions: Schedule appointment next week if continued acute on chronic back pain or continued urinary symptoms    I discussed the assessment and treatment plan with the patient. The patient was provided an opportunity to ask questions and all were answered. The patient agreed with the plan and demonstrated an understanding of the instructions.   The patient was advised to call back or seek an in[facility] evaluation if the symptoms worsen or if the condition fails to improve as anticipated.  I provided 10 minutes of non-face-to-face time during this encounter.   Antony Blackbird, MD

## 2019-08-17 NOTE — Progress Notes (Signed)
Patient verified DOB Patient has eaten today. Patient has taken medication today. Patient complains of chronic lower back pain for the past 3 days scaled at an 8 with sharp pains. Laying down gives relief. Movement causes sharp pain.

## 2019-09-27 ENCOUNTER — Other Ambulatory Visit: Payer: Self-pay | Admitting: Family Medicine

## 2019-09-27 ENCOUNTER — Telehealth: Payer: Self-pay | Admitting: Family Medicine

## 2019-09-27 DIAGNOSIS — Z202 Contact with and (suspected) exposure to infections with a predominantly sexual mode of transmission: Secondary | ICD-10-CM

## 2019-09-27 DIAGNOSIS — R300 Dysuria: Secondary | ICD-10-CM

## 2019-09-27 MED ORDER — METRONIDAZOLE 500 MG PO TABS: 500.0000 mg | ORAL_TABLET | Freq: Two times a day (BID) | ORAL | 0 refills | Status: DC

## 2019-09-27 NOTE — Telephone Encounter (Signed)
Notify patient that RX was sent to [facility] for metronidazole

## 2019-09-27 NOTE — Progress Notes (Signed)
Patient ID: Todd Ryan, male   DOB: 1960/08/06, 59 y.o.   MRN: [id number]   Patient was previously prescribed metronidazole due to exposure to STD but called back to say that he did not complete the prior RX and feels that symptoms have re-occurred and he would like a new RX

## 2019-09-27 NOTE — Telephone Encounter (Signed)
Patient called saying he was prescribed metroNIDAZOLE (FLAGYL) 500 MG tablet and he only took 8 tablets because he thought he was better. After a 2-3 weeks he took the rest of the tablets and states that his symptoms are back and would like to be prescribed more medication. Please f/u.

## 2019-09-28 NOTE — Telephone Encounter (Signed)
Spoke with patient and he stated he got the med that was requested yesterday.

## 2019-10-20 DIAGNOSIS — M5137 Other intervertebral disc degeneration, lumbosacral region: Secondary | ICD-10-CM | POA: Insufficient documentation

## 2020-05-03 ENCOUNTER — Encounter: Payer: Self-pay | Admitting: Gastroenterology

## 2020-05-14 ENCOUNTER — Ambulatory Visit (AMBULATORY_SURGERY_CENTER): Payer: Self-pay | Admitting: *Deleted

## 2020-05-14 ENCOUNTER — Other Ambulatory Visit: Payer: Self-pay

## 2020-05-14 VITALS — Ht 75.0 in | Wt 245.8 lb

## 2020-05-14 DIAGNOSIS — Z1211 Encounter for screening for malignant neoplasm of colon: Secondary | ICD-10-CM

## 2020-05-14 MED ORDER — SUPREP BOWEL PREP KIT 17.5-3.13-1.6 GM/177ML PO SOLN: 1.0000 | Freq: Once | ORAL | 0 refills | Status: AC

## 2020-05-14 NOTE — Progress Notes (Signed)
Completed covid vaccines greater than 2 weeks ago  Pt is aware that care partner will wait in the car during procedure; if they feel like they will be too hot or cold to wait in the car; they may wait in the 4 th floor lobby. Patient is aware to bring only one care partner. We want them to wear a mask (we do not have any that we can provide them), practice social distancing, and we will check their temperatures when they get here.  I did remind the patient that their care partner needs to stay in the parking lot the entire time and have a cell phone available, we will call them when the pt is ready for discharge. Patient will wear mask into building.   No trouble with anesthesia, difficulty with intubation or hx/fam hx of malignant hyperthermia per pt   No egg or soy allergy  No home oxygen use   No medications for weight loss taken  emmi information given- MyChart  Pt denies constipation issues- takes "occasional stool softener, maybe twice monthly but I am regular most of the time."

## 2020-05-31 ENCOUNTER — Encounter: Payer: Self-pay | Admitting: Gastroenterology

## 2020-05-31 ENCOUNTER — Other Ambulatory Visit: Payer: Self-pay

## 2020-05-31 ENCOUNTER — Other Ambulatory Visit: Payer: Self-pay | Admitting: Sports Medicine

## 2020-05-31 ENCOUNTER — Ambulatory Visit (AMBULATORY_SURGERY_CENTER): Payer: Medicaid Other | Admitting: Gastroenterology

## 2020-05-31 VITALS — BP 103/74 | HR 65 | Temp 98.2°F | Resp 22 | Ht 75.0 in | Wt 245.0 lb

## 2020-05-31 DIAGNOSIS — M4302 Spondylolysis, cervical region: Secondary | ICD-10-CM

## 2020-05-31 DIAGNOSIS — K621 Rectal polyp: Secondary | ICD-10-CM

## 2020-05-31 DIAGNOSIS — K635 Polyp of colon: Secondary | ICD-10-CM | POA: Diagnosis not present

## 2020-05-31 DIAGNOSIS — D125 Benign neoplasm of sigmoid colon: Secondary | ICD-10-CM

## 2020-05-31 DIAGNOSIS — Z1211 Encounter for screening for malignant neoplasm of colon: Secondary | ICD-10-CM | POA: Diagnosis not present

## 2020-05-31 DIAGNOSIS — D128 Benign neoplasm of rectum: Secondary | ICD-10-CM

## 2020-05-31 DIAGNOSIS — M25511 Pain in right shoulder: Secondary | ICD-10-CM

## 2020-05-31 MED ORDER — SODIUM CHLORIDE 0.9 % IV SOLN: 500.0000 mL | Freq: Once | INTRAVENOUS | Status: DC

## 2020-05-31 NOTE — Progress Notes (Signed)
Report given to PACU, vss 

## 2020-05-31 NOTE — Op Note (Signed)
[facility] Patient Name: Todd Ryan Procedure Date: 05/31/2020 8:04 AM MRN: [id number] Endoscopist: Mauri Pole , MD Age: 59 Referring MD:  Date of Birth: [birth year] Gender: Male Account #: [id number] Procedure:                Colonoscopy Indications:              Screening for colorectal malignant neoplasm Medicines:                Monitored Anesthesia Care Procedure:                Pre-Anesthesia Assessment:                           - Prior to the procedure, a History and Physical                            was performed, and patient medications and                            allergies were reviewed. The patient's tolerance of                            previous anesthesia was also reviewed. The risks                            and benefits of the procedure and the sedation                            options and risks were discussed with the patient.                            All questions were answered, and informed consent                            was obtained. Prior Anticoagulants: The patient has                            taken no previous anticoagulant or antiplatelet                            agents. ASA Grade Assessment: II - A patient with                            mild systemic disease. After reviewing the risks                            and benefits, the patient was deemed in                            satisfactory condition to undergo the procedure.                           After obtaining informed consent, the colonoscope  was passed under direct vision. Throughout the                            procedure, the patient's blood pressure, pulse, and                            oxygen saturations were monitored continuously. The                            Colonoscope was introduced through the anus and                            advanced to the the cecum, identified by                            appendiceal orifice and  ileocecal valve. The                            colonoscopy was performed without difficulty. The                            patient tolerated the procedure well. The quality                            of the bowel preparation was good. The ileocecal                            valve, appendiceal orifice, and rectum were                            photographed. Scope In: 8:14:00 AM Scope Out: 8:28:03 AM Scope Withdrawal Time: 0 hours 10 minutes 34 seconds  Total Procedure Duration: 0 hours 14 minutes 3 seconds  Findings:                 The perianal and digital rectal examinations were                            normal.                           Three sessile polyps were found in the rectum and                            sigmoid colon. The polyps were 1 to 2 mm in size.                            These polyps were removed with a cold biopsy                            forceps. Resection and retrieval were complete.                           Two sessile polyps were found in the rectum. The  polyps were 5 to 7 mm in size. These polyps were                            removed with a cold snare. Resection and retrieval                            were complete.                           Scattered small and large-mouthed diverticula were                            found in the sigmoid colon, descending colon,                            transverse colon and ascending colon. There was no                            evidence of diverticular bleeding.                           Non-bleeding external and internal hemorrhoids were                            found during retroflexion. The hemorrhoids were                            medium-sized. Complications:            No immediate complications. Estimated Blood Loss:     Estimated blood loss was minimal. Impression:               - Three 1 to 2 mm polyps in the rectum and in the                            sigmoid colon,  removed with a cold biopsy forceps.                            Resected and retrieved.                           - Two 5 to 7 mm polyps in the rectum, removed with                            a cold snare. Resected and retrieved.                           - Moderate diverticulosis in the sigmoid colon, in                            the descending colon, in the transverse colon and                            in the ascending colon. There was no evidence of  diverticular bleeding.                           - Non-bleeding external and internal hemorrhoids. Recommendation:           - Patient has a contact number available for                            emergencies. The signs and symptoms of potential                            delayed complications were discussed with the                            patient. Return to normal activities tomorrow.                            Written discharge instructions were provided to the                            patient.                           - Resume previous diet.                           - Continue present medications.                           - Await pathology results.                           - Repeat colonoscopy in 3 - 5 years for                            surveillance based on pathology results. Mauri Pole, MD 05/31/2020 8:34:22 AM This report has been signed electronically.

## 2020-05-31 NOTE — Progress Notes (Signed)
Called to room to assist during endoscopic procedure.  Patient ID and intended procedure confirmed with present staff. Received instructions for my participation in the procedure from the performing physician.  

## 2020-05-31 NOTE — Progress Notes (Signed)
No problems noted in the recovery room. Maw  Pt was sneezing multiple times after waking up.  No difficulty breathing.  I told pt he may want to use some OTC NASAL SALINE SPRAY IN HIS NOSE.  maw

## 2020-05-31 NOTE — Patient Instructions (Addendum)
Handouts were given to you on polyps, diverticulosis, and hemorrhoids. You may resume your current medications today. Await biopsy results. Please call if any questions or concerns.   YOU HAD AN ENDOSCOPIC PROCEDURE TODAY AT THE [facility]:   Refer to the procedure report that was given to you for any specific questions about what was found during the examination.  If the procedure report does not answer your questions, please call your gastroenterologist to clarify.  If you requested that your care partner not be given the details of your procedure findings, then the procedure report has been included in a sealed envelope for you to review at your convenience later.  YOU SHOULD EXPECT: Some feelings of bloating in the abdomen. Passage of more gas than usual.  Walking can help get rid of the air that was put into your GI tract during the procedure and reduce the bloating. If you had a lower endoscopy (such as a colonoscopy or flexible sigmoidoscopy) you may notice spotting of blood in your stool or on the toilet paper. If you underwent a bowel prep for your procedure, you may not have a normal bowel movement for a few days.  Please Note:  You might notice some irritation and congestion in your nose or some drainage.  This is from the oxygen used during your procedure.  There is no need for concern and it should clear up in a day or so.  SYMPTOMS TO REPORT IMMEDIATELY:   Following lower endoscopy (colonoscopy or flexible sigmoidoscopy):  Excessive amounts of blood in the stool  Significant tenderness or worsening of abdominal pains  Swelling of the abdomen that is new, acute  Fever of 100F or higher   For urgent or emergent issues, a gastroenterologist can be reached at any hour by calling [phone]. Do not use MyChart messaging for urgent concerns.    DIET:  We do recommend a small meal at first, but then you may proceed to your regular diet.  Drink plenty of fluids but  you should avoid alcoholic beverages for 24 hours.  ACTIVITY:  You should plan to take it easy for the rest of today and you should NOT DRIVE or use heavy machinery until tomorrow (because of the sedation medicines used during the test).    FOLLOW UP: Our staff will call the number listed on your records 48-72 hours following your procedure to check on you and address any questions or concerns that you may have regarding the information given to you following your procedure. If we do not reach you, we will leave a message.  We will attempt to reach you two times.  During this call, we will ask if you have developed any symptoms of COVID 19. If you develop any symptoms (ie: fever, flu-like symptoms, shortness of breath, cough etc.) before then, please call [phone].  If you test positive for Covid 19 in the 2 weeks post procedure, please call and report this information to us.    If any biopsies were taken you will be contacted by phone or by letter within the next 1-3 weeks.  Please call us at [phone] if you have not heard about the biopsies in 3 weeks.    SIGNATURES/CONFIDENTIALITY: You and/or your care partner have signed paperwork which will be entered into your electronic medical record.  These signatures attest to the fact that that the information above on your After Visit Summary has been reviewed and is understood.  Full responsibility of the   confidentiality of this discharge information lies with you and/or your care-partner. 

## 2020-05-31 NOTE — Progress Notes (Signed)
VS by CW  I have reviewed the patient's medical history in detail and updated the computerized patient record.  

## 2020-06-04 ENCOUNTER — Telehealth: Payer: Self-pay | Admitting: *Deleted

## 2020-06-04 NOTE — Telephone Encounter (Signed)
  Follow up Call-  Call back number 05/31/2020  Post procedure Call Back phone  # [phone]  Permission to leave phone message Yes  Some recent data might be hidden     Patient questions:  Do you have a fever, pain , or abdominal swelling? No. Pain Score  0 *  Have you tolerated food without any problems? Yes.    Have you been able to return to your normal activities? Yes.    Do you have any questions about your discharge instructions: Diet   No. Medications  No. Follow up visit  No.  Do you have questions or concerns about your Care? No.  Actions: * If pain score is 4 or above: No action needed, pain <4.  1. Have you developed a fever since your procedure? no  2.   Have you had an respiratory symptoms (SOB or cough) since your procedure? no  3.   Have you tested positive for COVID 19 since your procedure no  4.   Have you had any family members/close contacts diagnosed with the COVID 19 since your procedure?  no   If yes to any of these questions please route to Joylene John, RN and Joella Prince, RN

## 2020-06-13 ENCOUNTER — Encounter: Payer: Self-pay | Admitting: Gastroenterology

## 2020-06-18 ENCOUNTER — Ambulatory Visit
Admission: RE | Admit: 2020-06-18 | Discharge: 2020-06-18 | Disposition: A | Discharge status: Home / Self Care | Payer: Medicaid Other | Source: Ambulatory Visit | Attending: Sports Medicine | Admitting: Sports Medicine

## 2020-06-18 ENCOUNTER — Other Ambulatory Visit: Payer: Self-pay

## 2020-06-18 DIAGNOSIS — M4302 Spondylolysis, cervical region: Secondary | ICD-10-CM

## 2020-06-18 DIAGNOSIS — M25511 Pain in right shoulder: Secondary | ICD-10-CM

## 2020-07-04 NOTE — H&P (Signed)
HPI:  Todd Ryan is a 60 year old male who presents for follow-up on MRI of his cervical spine and right shoulder.  He is having right shoulder pain with some neck pain and was seen by Dr. Alfonso Ramus at the beginning of December.  Since then, he has continued to have similar pain mostly in the anterior shoulder, but extending up into his neck.  He has some left-sided shoulder/neck pain as well, but mostly his anterior shoulder is the issue.  Pain with internal rotation and mostly aching pain that wakes him up at night from sleep.  No new trauma or injury.  He did have an injury in his shoulder about two years ago when he was horsing around with his friends and states that it "has not been right" since then.  Past Medical History:  Diagnosis Date  . Anxiety   . Arthritis   . Asthma   . Back pain   . Depression   . Hyperlipidemia   . Hypertension   . Peripheral neuropathy   . Sleep apnea    CPAP     Past Surgical History:  Procedure Laterality Date  . LUMBAR SPINE SURGERY       Family History  Problem Relation Age of Onset  . Hypertension Mother   . CAD Father   . Colon cancer Neg Hx   . Esophageal cancer Neg Hx   . Rectal cancer Neg Hx   . Stomach cancer Neg Hx      Current Medications No current facility-administered medications for this encounter.   Marland Kitchen amLODipine (NORVASC) 10 MG tablet  . atorvastatin (LIPITOR) 80 MG tablet  . busPIRone (BUSPAR) 10 MG tablet  . cetirizine (ZYRTEC) 10 MG tablet  . Docusate Calcium (STOOL SOFTENER PO)  . esomeprazole (NEXIUM) 20 MG capsule  . fluticasone (FLONASE) 50 MCG/ACT nasal spray  . magnesium oxide (MAG-OX) 400 MG tablet  . meloxicam (MOBIC) 7.5 MG tablet  . metroNIDAZOLE (FLAGYL) 500 MG tablet  . oxyCODONE-acetaminophen (PERCOCET/ROXICET) 5-325 MG tablet  . predniSONE (DELTASONE) 20 MG tablet  . sertraline (ZOLOFT) 100 MG tablet     Allergies Shellfish allergy and Penicillins  Social History  reports that he has quit smoking. His  smoking use included cigarettes. He smoked 0.25 packs per day. He has never used smokeless tobacco. He reports current alcohol use. He reports that he does not use drugs.    Physical Exam General: Well appearing, in NAD Mental status: Alert and Oriented x3 Lungs: CTA b/l anterior and posterior without crackles or wheeze Heart: RRR, no m/g/r appreciated Abdomen: +BS, soft, nt, nd, no masses, hernias, or organomegaly appreciated Neurological: Speech Clear and organized, no gross focal findings or movement disorder appreciated Musculoskeletal: Inspection of the right shoulder does not show any obvious abnormalities.  He has tenderness to palpation of the anterior shoulder.  He has full range of motion with the exception of mildly decreased internal rotation.  He does have some pain relief with abduction/positive monkey sign.  5/5 strength in all planes with pain with resisted internal rotation.  Positive Speed's.  Negative Hawkins.  Positive empty can.  Neurovascularly intact distally.  no gross joint deformity or swelling.  Observed gait normal Extremities: Warm and well perfused w/o edema Skin: Warm and dry    IMAGING: Reviewed MRI findings with the patient.  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  He also has an  anterior superior labral tear with mild glenohumeral osteoarthritis.  MRI of his cervical spine did not show any signs of impingement on the right side, but does show some neural foraminal narrowing on the left and diffuse arthritis which has been seen previously on x-rays.    A/P Suspect most of the issues are coming from his shoulder pathology.  Discussed options including conservative management versus surgical repair.  The patient elected to proceed with surgical intervention.  The risks and benefits of the procedure were discussed at length with the patient  and he elected to proceed. Will plan for right shoulder arthroscopy with rotator cuff repair and subacromial decompression.

## 2020-07-12 ENCOUNTER — Other Ambulatory Visit (HOSPITAL_COMMUNITY)
Admission: RE | Admit: 2020-07-12 | Discharge: 2020-07-12 | Disposition: A | Discharge status: Home / Self Care | Payer: Medicaid Other | Source: Ambulatory Visit | Attending: Orthopedic Surgery | Admitting: Orthopedic Surgery

## 2020-07-12 DIAGNOSIS — Z01812 Encounter for preprocedural laboratory examination: Secondary | ICD-10-CM | POA: Diagnosis present

## 2020-07-12 DIAGNOSIS — Z20822 Contact with and (suspected) exposure to covid-19: Secondary | ICD-10-CM | POA: Diagnosis not present

## 2020-07-12 LAB — SARS CORONAVIRUS 2 (TAT 6-24 HRS): SARS Coronavirus 2: NEGATIVE

## 2020-07-15 ENCOUNTER — Other Ambulatory Visit: Payer: Self-pay

## 2020-07-15 ENCOUNTER — Encounter (HOSPITAL_BASED_OUTPATIENT_CLINIC_OR_DEPARTMENT_OTHER): Payer: Self-pay | Admitting: Orthopedic Surgery

## 2020-07-15 NOTE — Progress Notes (Signed)
Spoke w/ via phone for pre-op interview--- PT Lab needs dos---- Istat  And EKG             Lab results------  no COVID test ------ done 07-12-2020 negative results in epic Arrive at ------- 0800 NPO after MN NO Solid Food.  Clear liquids from MN until--- 0700 Medications to take morning of surgery ----- Lipitor, Norvasc, Nexium, Buspar Diabetic medication ----- n/a Patient Special Instructions ----- n/a Pre-Op special Istructions ----- n/a Patient verbalized understanding of instructions that were given at this phone interview. Patient denies shortness of breath, chest pain, fever, cough at this phone interview.

## 2020-07-16 ENCOUNTER — Ambulatory Visit (HOSPITAL_BASED_OUTPATIENT_CLINIC_OR_DEPARTMENT_OTHER)
Admission: RE | Admit: 2020-07-16 | Discharge: 2020-07-16 | Disposition: A | Discharge status: Home / Self Care | Payer: Medicaid Other | Attending: Orthopedic Surgery | Admitting: Orthopedic Surgery

## 2020-07-16 ENCOUNTER — Ambulatory Visit (HOSPITAL_BASED_OUTPATIENT_CLINIC_OR_DEPARTMENT_OTHER): Payer: Medicaid Other | Admitting: Anesthesiology

## 2020-07-16 ENCOUNTER — Encounter (HOSPITAL_BASED_OUTPATIENT_CLINIC_OR_DEPARTMENT_OTHER): Payer: Self-pay | Admitting: Orthopedic Surgery

## 2020-07-16 ENCOUNTER — Encounter (HOSPITAL_BASED_OUTPATIENT_CLINIC_OR_DEPARTMENT_OTHER)
Admission: RE | Disposition: A | Discharge status: Home / Self Care | Payer: Self-pay | Source: Home / Self Care | Attending: Orthopedic Surgery

## 2020-07-16 DIAGNOSIS — Z791 Long term (current) use of non-steroidal anti-inflammatories (NSAID): Secondary | ICD-10-CM | POA: Diagnosis not present

## 2020-07-16 DIAGNOSIS — Z91013 Allergy to seafood: Secondary | ICD-10-CM | POA: Diagnosis not present

## 2020-07-16 DIAGNOSIS — M75121 Complete rotator cuff tear or rupture of right shoulder, not specified as traumatic: Secondary | ICD-10-CM | POA: Diagnosis not present

## 2020-07-16 DIAGNOSIS — Z880 Allergy status to penicillin: Secondary | ICD-10-CM | POA: Insufficient documentation

## 2020-07-16 DIAGNOSIS — Z79899 Other long term (current) drug therapy: Secondary | ICD-10-CM | POA: Insufficient documentation

## 2020-07-16 DIAGNOSIS — Z7952 Long term (current) use of systemic steroids: Secondary | ICD-10-CM | POA: Diagnosis not present

## 2020-07-16 HISTORY — DX: Personal history of other diseases of the respiratory system: Z87.09

## 2020-07-16 HISTORY — DX: Gastro-esophageal reflux disease without esophagitis: K21.9

## 2020-07-16 HISTORY — DX: Other chronic pain: G89.29

## 2020-07-16 HISTORY — DX: Allergic rhinitis, unspecified: J30.9

## 2020-07-16 HISTORY — DX: Unspecified rotator cuff tear or rupture of right shoulder, not specified as traumatic: M75.101

## 2020-07-16 HISTORY — DX: Presence of spectacles and contact lenses: Z97.3

## 2020-07-16 HISTORY — DX: Prediabetes: R73.03

## 2020-07-16 HISTORY — DX: Major depressive disorder, single episode, unspecified: F32.9

## 2020-07-16 HISTORY — DX: Generalized anxiety disorder: F41.1

## 2020-07-16 HISTORY — DX: Obstructive sleep apnea (adult) (pediatric): G47.33

## 2020-07-16 HISTORY — PX: SHOULDER ARTHROSCOPY WITH ROTATOR CUFF REPAIR AND SUBACROMIAL DECOMPRESSION: SHX5686

## 2020-07-16 LAB — POCT I-STAT, CHEM 8
BUN: 13 mg/dL (ref 6–20)
Calcium, Ion: 1.26 mmol/L (ref 1.15–1.40)
Chloride: 106 mmol/L (ref 98–111)
Creatinine, Ser: 0.9 mg/dL (ref 0.61–1.24)
Glucose, Bld: 133 mg/dL — ABNORMAL HIGH (ref 70–99)
HCT: 46 % (ref 39.0–52.0)
Hemoglobin: 15.6 g/dL (ref 13.0–17.0)
Potassium: 3.9 mmol/L (ref 3.5–5.1)
Sodium: 141 mmol/L (ref 135–145)
TCO2: 24 mmol/L (ref 22–32)

## 2020-07-16 SURGERY — SHOULDER ARTHROSCOPY WITH ROTATOR CUFF REPAIR AND SUBACROMIAL DECOMPRESSION
Anesthesia: General | Laterality: Right

## 2020-07-16 MED ORDER — ONDANSETRON HCL 4 MG PO TABS: 4.0000 mg | ORAL_TABLET | Freq: Four times a day (QID) | ORAL | Status: DC | PRN

## 2020-07-16 MED ORDER — ACETAMINOPHEN 500 MG PO TABS
ORAL_TABLET | ORAL | Status: AC
  Filled 2020-07-16: qty 2

## 2020-07-16 MED ORDER — EPHEDRINE 5 MG/ML INJ
INTRAVENOUS | Status: AC
  Filled 2020-07-16: qty 10

## 2020-07-16 MED ORDER — PHENYLEPHRINE 40 MCG/ML (10ML) SYRINGE FOR IV PUSH (FOR BLOOD PRESSURE SUPPORT)
PREFILLED_SYRINGE | INTRAVENOUS | Status: DC | PRN
  Administered 2020-07-16: 40 ug via INTRAVENOUS
  Administered 2020-07-16 (×6): 80 ug via INTRAVENOUS
  Administered 2020-07-16: 40 ug via INTRAVENOUS
  Administered 2020-07-16: 80 ug via INTRAVENOUS

## 2020-07-16 MED ORDER — GLYCOPYRROLATE 0.2 MG/ML IJ SOLN
INTRAMUSCULAR | Status: DC | PRN
  Administered 2020-07-16 (×2): .1 mg via INTRAVENOUS

## 2020-07-16 MED ORDER — FENTANYL CITRATE (PF) 100 MCG/2ML IJ SOLN
INTRAMUSCULAR | Status: AC
  Filled 2020-07-16: qty 2

## 2020-07-16 MED ORDER — CEFAZOLIN SODIUM-DEXTROSE 2-4 GM/100ML-% IV SOLN
INTRAVENOUS | Status: AC
  Filled 2020-07-16: qty 100

## 2020-07-16 MED ORDER — PROPOFOL 10 MG/ML IV BOLUS
INTRAVENOUS | Status: DC | PRN
  Administered 2020-07-16: 200 mg via INTRAVENOUS

## 2020-07-16 MED ORDER — DIPHENHYDRAMINE HCL 50 MG/ML IJ SOLN
INTRAMUSCULAR | Status: AC
  Filled 2020-07-16: qty 1

## 2020-07-16 MED ORDER — METOCLOPRAMIDE HCL 5 MG PO TABS
5.0000 mg | ORAL_TABLET | Freq: Three times a day (TID) | ORAL | Status: DC | PRN
Start: 2020-07-16 — End: 2020-07-16
  Filled 2020-07-16: qty 2

## 2020-07-16 MED ORDER — LACTATED RINGERS IV SOLN: INTRAVENOUS | Status: DC

## 2020-07-16 MED ORDER — CELECOXIB 200 MG PO CAPS
ORAL_CAPSULE | ORAL | Status: AC
  Filled 2020-07-16: qty 1

## 2020-07-16 MED ORDER — PROPOFOL 10 MG/ML IV BOLUS
INTRAVENOUS | Status: AC
  Filled 2020-07-16: qty 20

## 2020-07-16 MED ORDER — CELECOXIB 200 MG PO CAPS
200.0000 mg | ORAL_CAPSULE | Freq: Once | ORAL | Status: AC
  Administered 2020-07-16: 200 mg via ORAL

## 2020-07-16 MED ORDER — DEXAMETHASONE SODIUM PHOSPHATE 10 MG/ML IJ SOLN
INTRAMUSCULAR | Status: AC
  Filled 2020-07-16: qty 1

## 2020-07-16 MED ORDER — FENTANYL CITRATE (PF) 100 MCG/2ML IJ SOLN
100.0000 ug | Freq: Once | INTRAMUSCULAR | Status: AC
Start: 2020-07-16 — End: 2020-07-16
  Administered 2020-07-16: 100 ug via INTRAVENOUS

## 2020-07-16 MED ORDER — CEFAZOLIN SODIUM-DEXTROSE 2-4 GM/100ML-% IV SOLN
2.0000 g | INTRAVENOUS | Status: AC
  Administered 2020-07-16: 2 g via INTRAVENOUS

## 2020-07-16 MED ORDER — LIDOCAINE HCL (PF) 2 % IJ SOLN
INTRAMUSCULAR | Status: AC
  Filled 2020-07-16: qty 5

## 2020-07-16 MED ORDER — GLYCOPYRROLATE PF 0.2 MG/ML IJ SOSY
PREFILLED_SYRINGE | INTRAMUSCULAR | Status: AC
  Filled 2020-07-16: qty 1

## 2020-07-16 MED ORDER — METHYLPREDNISOLONE ACETATE 80 MG/ML IJ SUSP
INTRAMUSCULAR | Status: DC | PRN
  Administered 2020-07-16: 80 mg

## 2020-07-16 MED ORDER — FENTANYL CITRATE (PF) 100 MCG/2ML IJ SOLN
INTRAMUSCULAR | Status: DC | PRN
  Administered 2020-07-16 (×2): 50 ug via INTRAVENOUS

## 2020-07-16 MED ORDER — ROCURONIUM BROMIDE 10 MG/ML (PF) SYRINGE
PREFILLED_SYRINGE | INTRAVENOUS | Status: AC
  Filled 2020-07-16: qty 10

## 2020-07-16 MED ORDER — METOCLOPRAMIDE HCL 5 MG/ML IJ SOLN: 5.0000 mg | Freq: Three times a day (TID) | INTRAMUSCULAR | Status: DC | PRN

## 2020-07-16 MED ORDER — BUPIVACAINE-EPINEPHRINE 0.5% -1:200000 IJ SOLN
INTRAMUSCULAR | Status: DC | PRN
  Administered 2020-07-16: 5 mL

## 2020-07-16 MED ORDER — MIDAZOLAM HCL 2 MG/2ML IJ SOLN
INTRAMUSCULAR | Status: AC
  Filled 2020-07-16: qty 2

## 2020-07-16 MED ORDER — ONDANSETRON HCL 4 MG/2ML IJ SOLN: 4.0000 mg | Freq: Four times a day (QID) | INTRAMUSCULAR | Status: DC | PRN

## 2020-07-16 MED ORDER — SUGAMMADEX SODIUM 200 MG/2ML IV SOLN
INTRAVENOUS | Status: DC | PRN
  Administered 2020-07-16: 250 mg via INTRAVENOUS

## 2020-07-16 MED ORDER — DIPHENHYDRAMINE HCL 50 MG/ML IJ SOLN
50.0000 mg | Freq: Once | INTRAMUSCULAR | Status: AC
  Administered 2020-07-16: 50 mg via INTRAVENOUS

## 2020-07-16 MED ORDER — OXYCODONE-ACETAMINOPHEN 5-325 MG PO TABS: 1.0000 | ORAL_TABLET | ORAL | 0 refills | Status: AC | PRN

## 2020-07-16 MED ORDER — FENTANYL CITRATE (PF) 100 MCG/2ML IJ SOLN: 25.0000 ug | INTRAMUSCULAR | Status: DC | PRN

## 2020-07-16 MED ORDER — PHENYLEPHRINE 40 MCG/ML (10ML) SYRINGE FOR IV PUSH (FOR BLOOD PRESSURE SUPPORT)
PREFILLED_SYRINGE | INTRAVENOUS | Status: AC
  Filled 2020-07-16: qty 10

## 2020-07-16 MED ORDER — SUGAMMADEX SODIUM 500 MG/5ML IV SOLN
INTRAVENOUS | Status: AC
  Filled 2020-07-16: qty 5

## 2020-07-16 MED ORDER — EPHEDRINE SULFATE-NACL 50-0.9 MG/10ML-% IV SOSY
PREFILLED_SYRINGE | INTRAVENOUS | Status: DC | PRN
  Administered 2020-07-16: 5 mg via INTRAVENOUS
  Administered 2020-07-16: 10 mg via INTRAVENOUS
  Administered 2020-07-16: 5 mg via INTRAVENOUS
  Administered 2020-07-16 (×2): 10 mg via INTRAVENOUS
  Administered 2020-07-16: 5 mg via INTRAVENOUS

## 2020-07-16 MED ORDER — ACETAMINOPHEN 500 MG PO TABS
1000.0000 mg | ORAL_TABLET | Freq: Once | ORAL | Status: AC
  Administered 2020-07-16: 1000 mg via ORAL

## 2020-07-16 MED ORDER — ONDANSETRON HCL 4 MG/2ML IJ SOLN
INTRAMUSCULAR | Status: AC
  Filled 2020-07-16: qty 2

## 2020-07-16 MED ORDER — MIDAZOLAM HCL 2 MG/2ML IJ SOLN
2.0000 mg | Freq: Once | INTRAMUSCULAR | Status: AC
  Administered 2020-07-16: 2 mg via INTRAVENOUS

## 2020-07-16 MED ORDER — DEXMEDETOMIDINE (PRECEDEX) IN NS 20 MCG/5ML (4 MCG/ML) IV SYRINGE
PREFILLED_SYRINGE | INTRAVENOUS | Status: AC
  Filled 2020-07-16: qty 5

## 2020-07-16 MED ORDER — DEXAMETHASONE SODIUM PHOSPHATE 10 MG/ML IJ SOLN
INTRAMUSCULAR | Status: DC | PRN
  Administered 2020-07-16: 5 mg via INTRAVENOUS

## 2020-07-16 MED ORDER — PHENYLEPHRINE HCL-NACL 10-0.9 MG/250ML-% IV SOLN
INTRAVENOUS | Status: DC | PRN
  Administered 2020-07-16: 40 ug/min via INTRAVENOUS

## 2020-07-16 MED ORDER — ROCURONIUM BROMIDE 100 MG/10ML IV SOLN
INTRAVENOUS | Status: DC | PRN
  Administered 2020-07-16: 100 mg via INTRAVENOUS
  Administered 2020-07-16: 10 mg via INTRAVENOUS

## 2020-07-16 MED ORDER — ONDANSETRON HCL 4 MG/2ML IJ SOLN
INTRAMUSCULAR | Status: DC | PRN
  Administered 2020-07-16: 4 mg via INTRAVENOUS

## 2020-07-16 MED ORDER — SODIUM CHLORIDE 0.9 % IR SOLN
Status: DC | PRN
  Administered 2020-07-16: 3000 mL
  Administered 2020-07-16: 6000 mL
  Administered 2020-07-16: 3000 mL

## 2020-07-16 MED ORDER — AMISULPRIDE (ANTIEMETIC) 5 MG/2ML IV SOLN: 10.0000 mg | Freq: Once | INTRAVENOUS | Status: DC | PRN

## 2020-07-16 SURGICAL SUPPLY — 83 items
AID PSTN UNV HD RSTRNT DISP (MISCELLANEOUS) ×1
ANCH SUT 2.9 PUSHLOCK ANCH (Orthopedic Implant) ×1 IMPLANT
ANCH SUT SWLK 19.1X4.75 (Anchor) ×1 IMPLANT
ANCHOR SUT BIO SW 4.75X19.1 (Anchor) ×2 IMPLANT
APL PRP STRL LF DISP 70% ISPRP (MISCELLANEOUS) ×1
BLADE SURG 15 STRL LF DISP TIS (BLADE) IMPLANT
BLADE SURG 15 STRL SS (BLADE)
BURR CLEARCUT OVAL 5.5X13 (MISCELLANEOUS) ×1 IMPLANT
BURR OVAL 12 FL 5.5X13 (MISCELLANEOUS) ×1
CANNULA 5.75X71 LONG (CANNULA) IMPLANT
CANNULA TWIST IN 8.25X7CM (CANNULA) ×2 IMPLANT
CHLORAPREP W/TINT 26 (MISCELLANEOUS) ×2 IMPLANT
CLSR STERI-STRIP ANTIMIC 1/2X4 (GAUZE/BANDAGES/DRESSINGS) ×2 IMPLANT
COVER WAND RF STERILE (DRAPES) ×2 IMPLANT
DISSECTOR 4.0MM X 13CM (MISCELLANEOUS) ×2 IMPLANT
DRAPE IMP U-DRAPE 54X76 (DRAPES) ×2 IMPLANT
DRAPE INCISE IOBAN 66X45 STRL (DRAPES) ×2 IMPLANT
DRAPE SHOULDER BEACH CHAIR (DRAPES) ×2 IMPLANT
DRAPE U-SHAPE 47X51 STRL (DRAPES) ×2 IMPLANT
DRSG ADAPTIC 3X8 NADH LF (GAUZE/BANDAGES/DRESSINGS) ×2 IMPLANT
DRSG EMULSION OIL 3X3 NADH (GAUZE/BANDAGES/DRESSINGS) IMPLANT
DRSG PAD ABDOMINAL 8X10 ST (GAUZE/BANDAGES/DRESSINGS) ×4 IMPLANT
ELECT REM PT RETURN 9FT ADLT (ELECTROSURGICAL)
ELECTRODE REM PT RTRN 9FT ADLT (ELECTROSURGICAL) IMPLANT
FIBER TAPE 2MM (SUTURE) ×2 IMPLANT
GAUZE SPONGE 4X4 12PLY STRL (GAUZE/BANDAGES/DRESSINGS) ×4 IMPLANT
GLOVE BIO SURGEON STRL SZ7.5 (GLOVE) ×4 IMPLANT
GLOVE SRG 8 PF TXTR STRL LF DI (GLOVE) ×2 IMPLANT
GLOVE SURG ENC MOIS LTX SZ6.5 (GLOVE) ×2 IMPLANT
GLOVE SURG UNDER POLY LF SZ6.5 (GLOVE) ×2 IMPLANT
GLOVE SURG UNDER POLY LF SZ8 (GLOVE) ×4
GOWN STRL REUS W/ TWL LRG LVL3 (GOWN DISPOSABLE) ×2 IMPLANT
GOWN STRL REUS W/TWL LRG LVL3 (GOWN DISPOSABLE) ×4
IV NS IRRIG 3000ML ARTHROMATIC (IV SOLUTION) ×10 IMPLANT
KIT INSERTION 2.9 PUSHLOCK (KITS) ×2 IMPLANT
KIT PUSHLOCK 2.9 HIP (KITS) IMPLANT
KIT TURNOVER CYSTO (KITS) ×2 IMPLANT
LOOP 2 FIBERLINK CLOSED (SUTURE) ×2 IMPLANT
MANIFOLD NEPTUNE II (INSTRUMENTS) ×2 IMPLANT
NDL SAFETY ECLIPSE 18X1.5 (NEEDLE) ×1 IMPLANT
NDL SUT 6 .5 CRC .975X.05 MAYO (NEEDLE) IMPLANT
NEEDLE HYPO 18GX1.5 SHARP (NEEDLE) ×2
NEEDLE MAYO TAPER (NEEDLE)
NEEDLE SCORPION MULTI FIRE (NEEDLE) IMPLANT
NS IRRIG 1000ML POUR BTL (IV SOLUTION) IMPLANT
PACK ARTHROSCOPY DSU (CUSTOM PROCEDURE TRAY) ×2 IMPLANT
PACK BASIN DAY SURGERY FS (CUSTOM PROCEDURE TRAY) ×2 IMPLANT
PASSER SUT SWIFTSTITCH HIP CRT (INSTRUMENTS) ×2 IMPLANT
PENCIL SMOKE EVACUATOR (MISCELLANEOUS) IMPLANT
PORT APPOLLO RF 90DEGREE MULTI (SURGICAL WAND) ×4 IMPLANT
RESTRAINT HEAD UNIVERSAL NS (MISCELLANEOUS) ×2 IMPLANT
SLEEVE SCD COMPRESS KNEE MED (MISCELLANEOUS) ×2 IMPLANT
SLING ARM FOAM STRAP LRG (SOFTGOODS) IMPLANT
SLING ARM FOAM STRAP MED (SOFTGOODS) IMPLANT
SLING ARM FOAM STRAP XLG (SOFTGOODS) IMPLANT
SLING ARM IMMOBILIZER LRG (SOFTGOODS) IMPLANT
SLING ARM IMMOBILIZER MED (SOFTGOODS) IMPLANT
SLING ULTRA II L (ORTHOPEDIC SUPPLIES) ×2 IMPLANT
STRIP CLOSURE SKIN 1/2X4 (GAUZE/BANDAGES/DRESSINGS) ×2 IMPLANT
SUCTION FRAZIER HANDLE 10FR (MISCELLANEOUS)
SUCTION TUBE FRAZIER 10FR DISP (MISCELLANEOUS) IMPLANT
SUT ETHIBOND 2 OS 4 DA (SUTURE) IMPLANT
SUT ETHILON 2 0 FS 18 (SUTURE) IMPLANT
SUT ETHILON 3 0 PS 1 (SUTURE) ×2 IMPLANT
SUT FIBERWIRE #2 38 T-5 BLUE (SUTURE)
SUT MNCRL AB 4-0 PS2 18 (SUTURE) IMPLANT
SUT TIGER TAPE 7 IN WHITE (SUTURE) IMPLANT
SUT VIC AB 0 CT1 27 (SUTURE)
SUT VIC AB 0 CT1 27XBRD ANBCTR (SUTURE) IMPLANT
SUT VIC AB 2-0 SH 27 (SUTURE)
SUT VIC AB 2-0 SH 27XBRD (SUTURE) IMPLANT
SUT VIC AB 3-0 FS2 27 (SUTURE) IMPLANT
SUTURE FIBERWR #2 38 T-5 BLUE (SUTURE) IMPLANT
SUTURE TAPE TIGERLINK 1.3MM BL (SUTURE) IMPLANT
SUTURETAPE TIGERLINK 1.3MM BL (SUTURE)
SYR 5ML LL (SYRINGE) ×2 IMPLANT
SYSTEM IMPL TENODESIS LNT 2.9 (Orthopedic Implant) ×2 IMPLANT
TAPE FIBER 2MM 7IN #2 BLUE (SUTURE) IMPLANT
TOWEL OR 17X26 10 PK STRL BLUE (TOWEL DISPOSABLE) ×2 IMPLANT
TUBE CONNECTING 12X1/4 (SUCTIONS) ×2 IMPLANT
TUBING ARTHROSCOPY IRRIG 16FT (MISCELLANEOUS) ×2 IMPLANT
WATER STERILE IRR 1000ML POUR (IV SOLUTION) IMPLANT
YANKAUER SUCT BULB TIP NO VENT (SUCTIONS) IMPLANT

## 2020-07-16 NOTE — Anesthesia Preprocedure Evaluation (Addendum)
Anesthesia Evaluation  Patient identified by MRN, date of birth, ID band Patient awake    Reviewed: Allergy & Precautions, NPO status , Patient's Chart, lab work & pertinent test results  History of Anesthesia Complications Negative for: history of anesthetic complications  Airway Mallampati: II   Neck ROM: Full    Dental  (+) Partial Upper, Dental Advisory Given   Pulmonary sleep apnea and Continuous Positive Airway Pressure Ventilation , Current Smoker,    Pulmonary exam normal        Cardiovascular hypertension, Pt. on medications Normal cardiovascular exam     Neuro/Psych Anxiety Depression negative neurological ROS     GI/Hepatic Neg liver ROS, GERD  ,  Endo/Other  negative endocrine ROS  Renal/GU negative Renal ROS     Musculoskeletal negative musculoskeletal ROS (+)   Abdominal   Peds  Hematology negative hematology ROS (+)   Anesthesia Other Findings   Reproductive/Obstetrics                            Anesthesia Physical Anesthesia Plan  ASA: II  Anesthesia Plan: General   Post-op Pain Management:  Regional for Post-op pain   Induction: Intravenous  PONV Risk Score and Plan: 1 and Ondansetron and Midazolam  Airway Management Planned: Oral ETT  Additional Equipment:   Intra-op Plan:   Post-operative Plan: Extubation in OR  Informed Consent: I have reviewed the patients History and Physical, chart, labs and discussed the procedure including the risks, benefits and alternatives for the proposed anesthesia with the patient or authorized representative who has indicated his/her understanding and acceptance.     Dental advisory given  Plan Discussed with: CRNA, Surgeon and Anesthesiologist  Anesthesia Plan Comments:        Anesthesia Quick Evaluation

## 2020-07-16 NOTE — Anesthesia Procedure Notes (Signed)
Procedure Name: Intubation Date/Time: 07/16/2020 9:48 AM Performed by: Gwyndolyn Saxon, CRNA Pre-anesthesia Checklist: Patient identified, Emergency Drugs available, Suction available and Patient being monitored Patient Re-evaluated:Patient Re-evaluated prior to induction Oxygen Delivery Method: Circle system utilized Preoxygenation: Pre-oxygenation with 100% oxygen Induction Type: IV induction Ventilation: Mask ventilation without difficulty and Oral airway inserted - appropriate to patient size Laryngoscope Size: Miller and 3 Grade View: Grade III Tube type: Oral Tube size: 8.0 mm Number of attempts: 1 Airway Equipment and Method: Patient positioned with wedge pillow and Stylet Placement Confirmation: positive ETCO2 and breath sounds checked- equal and bilateral Secured at: 23 cm Tube secured with: Tape Dental Injury: Teeth and Oropharynx as per pre-operative assessment

## 2020-07-16 NOTE — Interval H&P Note (Signed)
History and Physical Interval Note:  07/16/2020 8:50 AM  Todd Ryan  has presented today for surgery, with the diagnosis of Ranger.  The various methods of treatment have been discussed with the patient and family. After consideration of risks, benefits and other options for treatment, the patient has consented to  Procedure(s): SHOULDER ARTHROSCOPY WITH ROTATOR CUFF REPAIR AND SUBACROMIAL DECOMPRESSION (Right) as a surgical intervention.  The patient's history has been reviewed, patient examined, no change in status, stable for surgery.  I have reviewed the patient's chart and labs.  Questions were answered to the patient's satisfaction.     Renette Butters

## 2020-07-16 NOTE — Transfer of Care (Signed)
Immediate Anesthesia Transfer of Care Note  Patient: Todd Ryan  Procedure(s) Performed: SHOULDER ARTHROSCOPY WITH ROTATOR CUFF REPAIR AND SUBACROMIAL DECOMPRESSION (Right )  Patient Location: PACU  Anesthesia Type:General and Regional  Level of Consciousness: awake, alert  and patient cooperative  Airway & Oxygen Therapy: Patient Spontanous Breathing and Patient connected to face mask oxygen  Post-op Assessment: Report given to RN and Post -op Vital signs reviewed and stable  Post vital signs: Reviewed and stable  Last Vitals:  Vitals Value Taken Time  BP    Temp    Pulse 71 07/16/20 1150  Resp 24 07/16/20 1150  SpO2 98 % 07/16/20 1150  Vitals shown include unvalidated device data.  Last Pain:  Vitals:   07/16/20 0823  TempSrc: Oral  PainSc: 0-No pain      Patients Stated Pain Goal: 5 (97/41/63 8453)  Complications: No complications documented.

## 2020-07-16 NOTE — Discharge Instructions (Signed)
NO MOTRIN/ADVIL/IBUPROFEN until 2:30 PM TODAY   Maintain sling until follow up.  Diet: As you were doing prior to hospitalization   Dressing:  Keep dressings on and dry.  You may remove dressings in 3 days and shower over incisions.  No Bath / submerging incisions.  Cover with clean Band-Aid.  Activity:  Increase activity slowly as tolerated, but follow the weight bearing instructions below.  The rules on driving is that you can not be taking narcotics while you drive, and you must feel in control of the vehicle.    Weight Bearing:  Do not lift or bear weight with affected arm.  You may straighten and bend arm at the elbow.  Pain:  For severe pain, you may increase breakthrough pain medication (percocet) for the first few days post op to 2 tablets every 4 hours.  Stop this medication as soon as you are able.  Constipation: Narcotic pain medications cause constipation.  Reduce use or stop taking if you become constipated.  Drink plenty of fluids (prune juice may be helpful) and high fiber foods.  You may use a stool softener such as -  Colace (over the counter) 100 mg by mouth twice a day  And/or Miralax (over the counter) for constipation as needed.    Itching:  If you experience itching with your medications, try taking only a single pain pill, or even half a pain pill at a time.  You can also use benadryl over the counter for itching or also to help with sleep.   Precautions:  If you experience chest pain or shortness of breath - call 911 immediately for transfer to the hospital emergency department!!  If you develop a fever greater that 101 F, purulent drainage from wound, increased redness or drainage from wound, or calf pain -- Call the office at [phone]                                                 Follow- Up Appointment:  Please call for an appointment to be seen in 2 weeks [place] - [phone]    General Anesthesia, Adult, Care After This sheet gives you  information about how to care for yourself after your procedure. Your health care provider may also give you more specific instructions. If you have problems or questions, contact your health care provider. What can I expect after the procedure? After the procedure, the following side effects are common:  Pain or discomfort at the IV site.  Nausea.  Vomiting.  Sore throat.  Trouble concentrating.  Feeling cold or chills.  Feeling weak or tired.  Sleepiness and fatigue.  Soreness and body aches. These side effects can affect parts of the body that were not involved in surgery. Follow these instructions at home: For the time period you were told by your health care provider:  Rest.  Do not participate in activities where you could fall or become injured.  Do not drive or use machinery.  Do not drink alcohol.  Do not take sleeping pills or medicines that cause drowsiness.  Do not make important decisions or sign legal documents.  Do not take care of children on your own.   Eating and drinking  Follow any instructions from your health care provider about eating or drinking restrictions.  When you feel hungry, start by eating small amounts  of foods that are soft and easy to digest (bland), such as toast. Gradually return to your regular diet.  Drink enough fluid to keep your urine pale yellow.  If you vomit, rehydrate by drinking water, juice, or clear broth. General instructions  If you have sleep apnea, surgery and certain medicines can increase your risk for breathing problems. Follow instructions from your health care provider about wearing your sleep device: ? Anytime you are sleeping, including during daytime naps. ? While taking prescription pain medicines, sleeping medicines, or medicines that make you drowsy.  Have a responsible adult stay with you for the time you are told. It is important to have someone help care for you until you are awake and  alert.  Return to your normal activities as told by your health care provider. Ask your health care provider what activities are safe for you.  Take over-the-counter and prescription medicines only as told by your health care provider.  If you smoke, do not smoke without supervision.  Keep all follow-up visits as told by your health care provider. This is important. Contact a health care provider if:  You have nausea or vomiting that does not get better with medicine.  You cannot eat or drink without vomiting.  You have pain that does not get better with medicine.  You are unable to pass urine.  You develop a skin rash.  You have a fever.  You have redness around your IV site that gets worse. Get help right away if:  You have difficulty breathing.  You have chest pain.  You have blood in your urine or stool, or you vomit blood. Summary  After the procedure, it is common to have a sore throat or nausea. It is also common to feel tired.  Have a responsible adult stay with you for the time you are told. It is important to have someone help care for you until you are awake and alert.  When you feel hungry, start by eating small amounts of foods that are soft and easy to digest (bland), such as toast. Gradually return to your regular diet.  Drink enough fluid to keep your urine pale yellow.  Return to your normal activities as told by your health care provider. Ask your health care provider what activities are safe for you. This information is not intended to replace advice given to you by your health care provider. Make sure you discuss any questions you have with your health care provider. Document Revised: 02/29/2020 Document Reviewed: 09/28/2019 Elsevier Patient Education  [place] Anesthesia Blocks  1. Numbness or the inability to move the "blocked" extremity may last from 3-48 hours after placement. The length of time depends on the medication  injected and your individual response to the medication. If the numbness is not going away after 48 hours, call your surgeon.  2. The extremity that is blocked will need to be protected until the numbness is gone and the  Strength has returned. Because you cannot feel it, you will need to take extra care to avoid injury. Because it may be weak, you may have difficulty moving it or using it. You may not know what position it is in without looking at it while the block is in effect.  3. For blocks in the legs and feet, returning to weight bearing and walking needs to be done carefully. You will need to wait until the numbness is entirely gone and the strength has returned. You  should be able to move your leg and foot normally before you try and bear weight or walk. You will need someone to be with you when you first try to ensure you do not fall and possibly risk injury.  4. Bruising and tenderness at the needle site are common side effects and will resolve in a few days.  5. Persistent numbness or new problems with movement should be communicated to the surgeon or the [facility] [phone] [facility] [phone]).

## 2020-07-16 NOTE — Op Note (Signed)
07/16/2020  12:37 PM  PATIENT:  Todd Ryan    PRE-OPERATIVE DIAGNOSIS:  RIGHT SHOULDER ROTATOR CUFF REPAIR  POST-OPERATIVE DIAGNOSIS:  Same  PROCEDURE:  SHOULDER ARTHROSCOPY WITH ROTATOR CUFF REPAIR AND SUBACROMIAL DECOMPRESSION  SURGEON:  Renette Butters, MD  ASSISTANT: Margy Clarks, PA-C, he was present and scrubbed throughout the case, critical for completion in a timely fashion, and for retraction, instrumentation, and closure.   ANESTHESIA:   General  PREOPERATIVE INDICATIONS:  Todd Ryan is a  60 y.o. male with a diagnosis of [place] who failed conservative measures and elected for surgical management.    The risks benefits and alternatives were discussed with the patient preoperatively including but not limited to the risks of infection, bleeding, nerve injury, cardiopulmonary complications, the need for revision surgery, among others, and the patient was willing to proceed.  OPERATIVE IMPLANTS: arthrex anchors  OPERATIVE FINDINGS: subscap upper border tear, Supra musculotendonous tear  BLOOD LOSS: minimal  COMPLICATIONS: none  OPERATIVE PROCEDURE:  Patient was identified in the preoperative holding area and site was marked by me He was transported to the operating theater and placed on the table in beach chair position taking care to pad all bony prominences. After a preincinduction time out anesthesia was induced. The right upper extremity was prepped and draped in normal sterile fashion and a pre-incision timeout was performed. Todd Dales Buitron received ancef for preoperative antibiotics.   Initially made a posterior arthroscopic portal and inserted the arthroscope into the glenohumeral joint. tour of the joint demonstrated the above operative findings  I created an anterior portal just lateral to the coracoid under direct visualization using a spinal needle.  I performed an extensive debridement of the scarred synovial tissue  and remaining structures  I performed a loop and tack biceps tenodesis.  I used a combination of biter and shaver to release the biceps tendon from the superior labrum and then used the shaver to debride the superior labrum to a smooth rim.  I placed a fiberloop stitch around the upper edge of the subscap tendon and placed this in the lesser tuberosity anchor as well. This repaired the subscap tear.   I then introduced the arthroscope into the subacromial space and brought the shaver into the anterior portal. I debrided the bursa for appropriate visualization.  I performed an extensive debridement of the superior labrum, subacromial bursa and synovium  I then performed a subacromial decompression using combination of the shaver ArthroCare and burr using a cutting block technique. As happy with the final elevation of the subacromial space on multiple portal views.   Next I turned my attention to the distal clavicle and through the anterior portal using the bur and shaver I was able to perform a distal clavicle excision. I then switched portals and inserted the arthroscope into the anterior portal and was happy with an appropriate resection of the distal clavicle.   I debrided the rotator cuff tear and examined its mobility. There was a roughly 5 millimeters tear and I debrided the tear here. This was musculotendinous so I elected to place a horizontal stitch and secure it to a lateral anchor.   I was happy with the tendon apposition and there was minimal to no dog ear.  Next I removed all arthroscopic equipment expressed all fluid and closed the portals with a nylon stitch. A sterile dressing was applied the patient was taken the PACU in stable condition.  POST OPERATIVE PLAN: The patient  will be in a sling full-time and keep the dressings clean dry and intact. DVT prophylaxis will consist of early ambulation

## 2020-07-16 NOTE — Anesthesia Postprocedure Evaluation (Signed)
Anesthesia Post Note  Patient: Todd Ryan  Procedure(s) Performed: SHOULDER ARTHROSCOPY WITH ROTATOR CUFF REPAIR AND SUBACROMIAL DECOMPRESSION (Right )     Patient location during evaluation: PACU Anesthesia Type: General Level of consciousness: sedated Pain management: pain level controlled Vital Signs Assessment: post-procedure vital signs reviewed and stable Respiratory status: spontaneous breathing and respiratory function stable Cardiovascular status: stable Postop Assessment: no apparent nausea or vomiting Anesthetic complications: no   No complications documented.  Last Vitals:  Vitals:   07/16/20 1315 07/16/20 1345  BP: (!) 146/96 128/82  Pulse: 64 63  Resp: (!) 9 12  Temp:  36.5 C  SpO2: 94% 96%    Last Pain:  Vitals:   07/16/20 1313  TempSrc:   PainSc: 0-No pain                 Paisly Fingerhut DANIEL

## 2020-07-16 NOTE — Progress Notes (Signed)
Assisted Dr. Singer with right, ultrasound guided, interscalene  block. Side rails up, monitors on throughout procedure. See vital signs in flow sheet. Tolerated Procedure well. 

## 2020-07-17 ENCOUNTER — Encounter (HOSPITAL_BASED_OUTPATIENT_CLINIC_OR_DEPARTMENT_OTHER): Payer: Self-pay | Admitting: Orthopedic Surgery

## 2020-07-17 MED ORDER — BUPIVACAINE LIPOSOME 1.3 % IJ SUSP
INTRAMUSCULAR | Status: DC | PRN
  Administered 2020-07-16: 10 mL via PERINEURAL

## 2020-07-17 MED ORDER — BUPIVACAINE HCL (PF) 0.5 % IJ SOLN
INTRAMUSCULAR | Status: DC | PRN
  Administered 2020-07-16: 15 mL via PERINEURAL

## 2020-07-17 NOTE — Anesthesia Procedure Notes (Signed)
Anesthesia Regional Block: Interscalene brachial plexus block   Pre-Anesthetic Checklist: ,, timeout performed, Correct Patient, Correct Site, Correct Laterality, Correct Procedure, Correct Position, site marked, Risks and benefits discussed,  Surgical consent,  Pre-op evaluation,  At surgeon's request and post-op pain management  Laterality: Right  Prep: chloraprep       Needles:  Injection technique: Single-shot  Needle Type: Echogenic Stimulator Needle     Needle Length: 5cm  Needle Gauge: 22     Additional Needles:   Narrative:  Start time: 07/16/2020 8:58 AM End time: 07/16/2020 9:08 AM Injection made incrementally with aspirations every 5 mL.  Performed by: Personally  Anesthesiologist: Duane Boston, MD  Additional Notes: Functioning IV was confirmed and monitors applied.  A 47mm 22ga echogenic arrow stimulator was used. Sterile prep and drape,hand hygiene and sterile gloves were used.Ultrasound guidance: relevant anatomy identified, needle position confirmed, local anesthetic spread visualized around nerve(s)., vascular puncture avoided.  Image printed for medical record.  Negative aspiration and negative test dose prior to incremental administration of local anesthetic. The patient tolerated the procedure well.

## 2020-07-17 NOTE — Addendum Note (Signed)
Addendum  created 07/17/20 1939 by Duane Boston, MD   Child order released for a procedure order, Clinical Note Signed, Intraprocedure Blocks edited, Intraprocedure Meds edited

## 2020-08-01 ENCOUNTER — Other Ambulatory Visit: Payer: Self-pay

## 2020-08-01 ENCOUNTER — Ambulatory Visit: Payer: Medicaid Other | Attending: Orthopedic Surgery

## 2020-08-01 DIAGNOSIS — G8929 Other chronic pain: Secondary | ICD-10-CM | POA: Insufficient documentation

## 2020-08-01 DIAGNOSIS — M67813 Other specified disorders of tendon, right shoulder: Secondary | ICD-10-CM | POA: Insufficient documentation

## 2020-08-01 DIAGNOSIS — Z9889 Other specified postprocedural states: Secondary | ICD-10-CM | POA: Diagnosis not present

## 2020-08-01 DIAGNOSIS — M25511 Pain in right shoulder: Secondary | ICD-10-CM | POA: Insufficient documentation

## 2020-08-01 NOTE — Therapy (Signed)
[place], [place], [address] Phone: [phone]   Fax:  [phone]  Physical Therapy Evaluation  Patient Details  Name: Todd Ryan MRN: [id number] Date of Birth: 08-Dec-1960 Referring Provider (PT): Edmonia Lynch, MD   Encounter Date: 08/01/2020    Past Medical History:  Diagnosis Date  . Allergic rhinitis   . Arthritis    shoulder, neck, back  . Chronic back pain   . GAD (generalized anxiety disorder)   . GERD (gastroesophageal reflux disease)   . History of asthma    child  . Hyperlipidemia   . Hypertension   . MDD (major depressive disorder)   . OSA on CPAP    07-15-2020  per pt uses approx. 3 times weekly  . Peripheral neuropathy   . Pre-diabetes   . Rotator cuff tear, right   . Wears glasses     Past Surgical History:  Procedure Laterality Date  . COLONOSCOPY WITH PROPOFOL  05/31/2020  . LUMBAR SPINE SURGERY  11/2014  . SHOULDER ARTHROSCOPY WITH ROTATOR CUFF REPAIR AND SUBACROMIAL DECOMPRESSION Right 07/16/2020   Procedure: SHOULDER ARTHROSCOPY WITH ROTATOR CUFF REPAIR AND SUBACROMIAL DECOMPRESSION;  Surgeon: Renette Butters, MD;  Location: [facility];  Service: Orthopedics;  Laterality: Right;    There were no vitals filed for this visit.    Subjective Assessment - 08/01/20 1735    Subjective Pt reports he had shoulder surgery last month and has been in sling, which he is supposed to wear all the time unless in shower or PT, until he returns to his MD in a few weeks. He is having wrist discomfort that he wants to figure out how to work on too. He had right rotator cuff and biceps repair.    Limitations Lifting;House hold activities;Writing    Patient Stated Goals to reduce pain, use shoulder    Currently in Pain? Yes    Pain Score 0-No pain   reports some sharp pain that goes away quickly   Pain Location Shoulder    Pain Orientation Right    Pain Descriptors /  Indicators Sharp    Pain Type Acute pain;Surgical pain    Pain Radiating Towards wrist    Pain Onset 1 to 4 weeks ago    Pain Frequency Intermittent    Aggravating Factors  movement    Pain Relieving Factors wearing sling    Effect of Pain on Daily Activities cannot use R shoulder              [facility] PT Assessment - 08/01/20 0001      Assessment   Medical Diagnosis R rotator cuff repair    Referring Provider (PT) Edmonia Lynch, MD    Onset Date/Surgical Date 07/16/20    Hand Dominance Right    Next MD Visit 08/22/20      Precautions   Precautions Shoulder    Type of Shoulder Precautions PROM    Required Braces or Orthoses Sling      Restrictions   Weight Bearing Restrictions No      Balance Screen   Has the patient fallen in the past 6 months No    Has the patient had a decrease in activity level because of a fear of falling?  No    Is the patient reluctant to leave their home because of a fear of falling?  No      Prior Function   Vocation On disability    Leisure  Farming, takes care of sheep      Posture/Postural Control   Posture Comments forward shoulders      ROM / Strength   AROM / PROM / Strength AROM;PROM;Strength      AROM   Overall AROM Comments L WFL, R shoulder NT, R elbow WFL    AROM Assessment Site Shoulder;Elbow      PROM   PROM Assessment Site Shoulder    Right/Left Shoulder Right    Right Shoulder Flexion 55 Degrees   pain   Right Shoulder ABduction 60 Degrees    Right Shoulder Internal Rotation 45 Degrees    Right Shoulder External Rotation 0 Degrees   pain     Strength   Overall Strength Comments R UE NT, L WFL      Palpation   Palpation comment TTP around shoulder, significant guarding, decrease mobility with GHJ glides, empty end feels                      Objective measurements completed on examination: See above findings.               PT Education - 08/01/20 1738    Education Details HEP, POC,  diagnosis, prognosis    Brandi(s) Educated Patient    Methods Explanation;Demonstration;Tactile cues;Verbal cues;Handout    Comprehension Verbalized understanding;Returned demonstration;Verbal cues required;Tactile cues required            PT Short Term Goals - 08/01/20 1800      PT SHORT TERM GOAL #1   Title Pt will be independent and compliant with initial HEP.    Baseline provided today    Time 2    Period Weeks    Status New    Target Date 08/15/20      PT SHORT TERM GOAL #2   Title Pt will restore full PROM and initiate AAROM by 6 weeks.    Baseline significant muscle guarding    Time 4    Period Weeks    Status New    Target Date 08/29/20             PT Long Term Goals - 08/01/20 1801      PT LONG TERM GOAL #1   Title Pt will initiate AROM and gentle isometric strengthening    Baseline PROM initiated at eval    Time 6    Period Weeks    Status New    Target Date 09/26/20      PT LONG TERM GOAL #2   Title Pt will demonstrate R shoulder ROM WFL without pain as needed for ADLs including bathing and dressing.    Time 8    Period Weeks    Status New    Target Date 09/26/20      PT LONG TERM GOAL #3   Title Pt will demonstrate 4+/5 R shoulder MMT without pain to perform leisure activities.    Baseline NT    Time 8    Period Weeks    Status New    Target Date 09/26/20      PT LONG TERM GOAL #4   Title Pt will be able to lift/lower >/= 15# from waist to [facility] and floor to waist for functional mobility.    Time 8    Period Weeks    Status New    Target Date 09/26/20                  Plan -  08/01/20 1743    Clinical Impression Statement Pt is a 60 yo male who presents to OP PT s/p R rotator cuff repair and biceps tenodesis on 07/16/20. He is in a sling full time and passive for the next 6 weeks. Reviewed precautions and pt is diligent thus far. Dressings are intact and clean with no sign of infection, but mild edema present. Pt is very guarded and  PROM was limited to 60 or less into abd/flexion and neutral for ER. Pt educated on precautions, HEP, diagnosis, prognosis, importance of PT and he verbalized understanding and consent to tx. Pt would benefit from skilled PT 2x/week for next 8 weeks to restore mobility and function.    Personal Factors and Comorbidities Comorbidity 3+;Past/Current Experience;Time since onset of injury/illness/exacerbation    Comorbidities HTN, HLD, Arthritis    Examination-Activity Limitations Bed Mobility;Bathing;Reach Overhead;Carry;Lift;Dressing    Examination-Participation Restrictions Driving;Cleaning    Stability/Clinical Decision Making Stable/Uncomplicated    Clinical Decision Making Low    Rehab Potential Good    PT Frequency 2x / week    PT Duration 8 weeks    PT Treatment/Interventions Spinal Manipulations;Joint Manipulations;Vasopneumatic Device;Taping;Dry needling;Passive range of motion;Manual techniques;Patient/family education;Therapeutic exercise;Neuromuscular re-education;ADLs/Self Care Home Management;Cryotherapy;Electrical Stimulation;Iontophoresis 4mg /ml Dexamethasone;Moist Heat;Therapeutic activities    PT Next Visit Plan Assess response to HEP/update PRN, passive 6 weeks, sling until further notice from MD, manual for PROM    PT Home Exercise Plan [email or web address]: pendulums, PROM flexion on counter/table, wrist flexion/ext stretch    Consulted and Agree with Plan of Care Patient           Patient will benefit from skilled therapeutic intervention in order to improve the following deficits and impairments:  Pain,Postural dysfunction,Impaired UE functional use,Hypomobility,Decreased strength,Impaired flexibility,Increased fascial restricitons,Impaired perceived functional ability,Increased edema,Decreased range of motion  Visit Diagnosis: S/P right rotator cuff repair - Plan: PT plan of care cert/re-cert  Biceps tendonosis of right shoulder - Plan: PT plan of care cert/re-cert  Chronic right  shoulder pain - Plan: PT plan of care cert/re-cert     Problem List Patient Active Problem List   Diagnosis Date Noted  . TIA (transient ischemic attack) 06/02/2018  . Hypertension 06/02/2018  . Lower back injury 12/07/2013    Izell Hatfield, PT, DPT 08/01/2020, 6:12 PM  [facility] [address] [place], [place], [address] Phone: [phone]   Fax:  [phone]  Name: Todd Ryan MRN: [id number] Date of Birth: [birth year]  Check all possible CPT codes: 73710- Therapeutic Exercise, [phone]- Neuro Re-education, 97140 - Manual Therapy, 85462 - Therapeutic Activities, [address]0350 - [place], [phone] - Iontophoresis and 82993 - [place]

## 2020-08-01 NOTE — Patient Instructions (Signed)
Access Code: [email or web address] URL: [email or web address] Date: 08/01/2020 Prepared by: Kathreen Cornfield  Exercises Flexion-Extension Shoulder Pendulum with Table Support - 3-5 x daily - 7 x weekly - 30 seconds-1 minute hold Horizontal Shoulder Pendulum with Table Support - 3-5 x daily - 7 x weekly - 30 seconds-1 minute hold Seated Wrist Flexion Extension PROM - 3-5 x daily - 7 x weekly - 3 sets - 10-20 seconds hold

## 2020-08-05 ENCOUNTER — Ambulatory Visit: Payer: Medicaid Other

## 2020-08-05 ENCOUNTER — Other Ambulatory Visit: Payer: Self-pay

## 2020-08-05 DIAGNOSIS — M67813 Other specified disorders of tendon, right shoulder: Secondary | ICD-10-CM

## 2020-08-05 DIAGNOSIS — Z9889 Other specified postprocedural states: Secondary | ICD-10-CM | POA: Diagnosis not present

## 2020-08-05 DIAGNOSIS — G8929 Other chronic pain: Secondary | ICD-10-CM

## 2020-08-05 NOTE — Therapy (Signed)
[place], [place], [address] Phone: [phone]   Fax:  [phone]  Physical Therapy Treatment  Patient Details  Name: Todd Ryan MRN: [id number] Date of Birth: 1960-11-15 Referring Provider (PT): Edmonia Lynch, MD   Encounter Date: 08/05/2020   PT End of Session - 08/05/20 0746    Visit Number 2    Number of Visits 27    Date for PT Re-Evaluation 09/26/20    Authorization Type [employer] Medicaid    PT Start Time [id number]    PT Stop Time 0825    PT Time Calculation (min) 39 min    Equipment Utilized During Treatment Other (comment)   sling   Activity Tolerance Patient tolerated treatment well;Patient limited by pain    Behavior During Therapy [facility] for tasks assessed/performed           Past Medical History:  Diagnosis Date  . Allergic rhinitis   . Arthritis    shoulder, neck, back  . Chronic back pain   . GAD (generalized anxiety disorder)   . GERD (gastroesophageal reflux disease)   . History of asthma    child  . Hyperlipidemia   . Hypertension   . MDD (major depressive disorder)   . OSA on CPAP    07-15-2020  per pt uses approx. 3 times weekly  . Peripheral neuropathy   . Pre-diabetes   . Rotator cuff tear, right   . Wears glasses     Past Surgical History:  Procedure Laterality Date  . COLONOSCOPY WITH PROPOFOL  05/31/2020  . LUMBAR SPINE SURGERY  11/2014  . SHOULDER ARTHROSCOPY WITH ROTATOR CUFF REPAIR AND SUBACROMIAL DECOMPRESSION Right 07/16/2020   Procedure: SHOULDER ARTHROSCOPY WITH ROTATOR CUFF REPAIR AND SUBACROMIAL DECOMPRESSION;  Surgeon: Renette Butters, MD;  Location: [place];  Service: Orthopedics;  Laterality: Right;    There were no vitals filed for this visit.   Subjective Assessment - 08/05/20 0748    Subjective "Exercises are going good. I feel a little pulling and throbbing but it's doing good."    Limitations Lifting;House hold  activities;Writing    Patient Stated Goals to reduce pain, use shoulder    Currently in Pain? Yes    Pain Score 3     Pain Location Shoulder    Pain Orientation Right    Pain Descriptors / Indicators Sharp;Throbbing    Pain Type Acute pain;Surgical pain    Pain Onset 1 to 4 weeks ago              [facility] PT Assessment - 08/05/20 0001      Assessment   Medical Diagnosis R rotator cuff repair    Referring Provider (PT) Edmonia Lynch, MD    Onset Date/Surgical Date 07/16/20    Next MD Visit 08/22/20      PROM   Right Shoulder Flexion 70 Degrees    Right Shoulder ABduction 60 Degrees    Right Shoulder Internal Rotation 45 Degrees    Right Shoulder External Rotation 0 Degrees      Palpation   Palpation comment Minimal TTP around shoulder today                         [facility] Adult PT Treatment/Exercise - 08/05/20 0001      Exercises   Exercises Shoulder      Shoulder Exercises: Supine   Other Supine Exercises cervical retraction with pt reclined on mat  x 20. Cues for form/technique      Shoulder Exercises: Seated   Retraction Strengthening;20 reps    Retraction Limitations within pain free range with RUE propped/resting. Tactile cues for scapular retraction and cues to avoid shoulder AROM      Shoulder Exercises: Standing   Other Standing Exercises Pendulums in all directions with cues for form and relaxation techniques to avoid actively moving R shoulder      Hand Exercises   Rubberbands Resisted digit EXT (green resistance) x 25    Other Hand Exercises Putty squeezes x 30    Other Hand Exercises Wrist AROM FL/EXT and radial/ulnar deviation x 20 each direction and wrist FL/EXT stretches      Manual Therapy   Manual Therapy Passive ROM    Manual therapy comments STM along R shoulder and biceps    Passive ROM R shoulder PROM in FL, ABD, and IR within pain free ranges. Forearm supination/pronation and elbow FL/EXT PROM within pain free range                   PT Education - 08/05/20 0936    Education Details Updated HEP and reviewed precautions/to avoid AROM    Diep(s) Educated Patient    Methods Explanation;Demonstration    Comprehension Verbalized understanding;Returned demonstration            PT Short Term Goals - 08/01/20 1800      PT SHORT TERM GOAL #1   Title Pt will be independent and compliant with initial HEP.    Baseline provided today    Time 2    Period Weeks    Status New    Target Date 08/15/20      PT SHORT TERM GOAL #2   Title Pt will restore full PROM and initiate AAROM by 6 weeks.    Baseline significant muscle guarding    Time 4    Period Weeks    Status New    Target Date 08/29/20             PT Long Term Goals - 08/01/20 1801      PT LONG TERM GOAL #1   Title Pt will initiate AROM and gentle isometric strengthening    Baseline PROM initiated at eval    Time 6    Period Weeks    Status New    Target Date 09/26/20      PT LONG TERM GOAL #2   Title Pt will demonstrate R shoulder ROM WFL without pain as needed for ADLs including bathing and dressing.    Time 8    Period Weeks    Status New    Target Date 09/26/20      PT LONG TERM GOAL #3   Title Pt will demonstrate 4+/5 R shoulder MMT without pain to perform leisure activities.    Baseline NT    Time 8    Period Weeks    Status New    Target Date 09/26/20      PT LONG TERM GOAL #4   Title Pt will be able to lift/lower >/= 15# from waist to [facility] and floor to waist for functional mobility.    Time 8    Period Weeks    Status New    Target Date 09/26/20                 Plan - 08/05/20 0746    Clinical Impression Statement Patient tolerated treatment session well with continued  R shoulder/elbow PROM, STM, and hand/wrist AROM and resisted exercises. He expressed that his shoulder soreness decreased following some PROM. He expressed feeling some tightness in his neck and was advised to perform gentle upper trap  stretches within tolerance. He will continue to benefit from skilled PT intervention to progress RUE mobility and function per tolerance and protocol.    Personal Factors and Comorbidities Comorbidity 3+;Past/Current Experience;Time since onset of injury/illness/exacerbation    Comorbidities HTN, HLD, Arthritis    Examination-Activity Limitations Bed Mobility;Bathing;Reach Overhead;Carry;Lift;Dressing    Examination-Participation Restrictions Driving;Cleaning    Stability/Clinical Decision Making Stable/Uncomplicated    Rehab Potential Good    PT Frequency 2x / week    PT Duration 8 weeks    PT Treatment/Interventions Spinal Manipulations;Joint Manipulations;Vasopneumatic Device;Taping;Dry needling;Passive range of motion;Manual techniques;Patient/family education;Therapeutic exercise;Neuromuscular re-education;ADLs/Self Care Home Management;Cryotherapy;Electrical Stimulation;Iontophoresis 4mg /ml Dexamethasone;Moist Heat;Therapeutic activities    PT Next Visit Plan Assess response to HEP/update PRN, passive 6 weeks, sling until further notice from MD, manual for PROM    PT Home Exercise Plan [id number]: pendulums, PROM flexion on counter/table, wrist flexion/ext stretch, wrist and digit AROM, scapular and cervical retractions, forearm pronation/supination and elbow FL/EXT PROM with UE supported on surface, putty squeezes    Consulted and Agree with Plan of Care Patient           Patient will benefit from skilled therapeutic intervention in order to improve the following deficits and impairments:  Pain,Postural dysfunction,Impaired UE functional use,Hypomobility,Decreased strength,Impaired flexibility,Increased fascial restricitons,Impaired perceived functional ability,Increased edema,Decreased range of motion  Visit Diagnosis: S/P right rotator cuff repair  Biceps tendonosis of right shoulder  Chronic right shoulder pain     Problem List Patient Active Problem List   Diagnosis Date  Noted  . TIA (transient ischemic attack) 06/02/2018  . Hypertension 06/02/2018  . Lower back injury 12/07/2013       Haydee Monica, PT, DPT 08/05/20 9:54 AM  [facility] [address] [place], [place], [address] Phone: [phone]   Fax:  [phone]  Name: JAYDON PUSKARICH MRN: [id number] Date of Birth: 1961/05/05

## 2020-08-07 ENCOUNTER — Other Ambulatory Visit: Payer: Self-pay

## 2020-08-07 ENCOUNTER — Ambulatory Visit: Payer: Medicaid Other

## 2020-08-07 DIAGNOSIS — G8929 Other chronic pain: Secondary | ICD-10-CM

## 2020-08-07 DIAGNOSIS — Z9889 Other specified postprocedural states: Secondary | ICD-10-CM

## 2020-08-07 DIAGNOSIS — M67813 Other specified disorders of tendon, right shoulder: Secondary | ICD-10-CM

## 2020-08-07 DIAGNOSIS — M25511 Pain in right shoulder: Secondary | ICD-10-CM

## 2020-08-07 NOTE — Therapy (Signed)
[place], [place], [address] Phone: [phone]   Fax:  [phone]  Physical Therapy Treatment  Patient Details  Name: Todd Ryan MRN: [id number] Date of Birth: 12/27/1960 Referring Provider (PT): Edmonia Lynch, MD   Encounter Date: 08/07/2020   PT End of Session - 08/07/20 0915    Visit Number 3    Number of Visits 27    Date for PT Re-Evaluation 09/26/20    Authorization Type [employer]HC Medicaid    PT Start Time 0915    PT Stop Time 0957    PT Time Calculation (min) 42 min    Equipment Utilized During Treatment Other (comment)   sling   Activity Tolerance Patient tolerated treatment well;Patient limited by pain    Behavior During Therapy [facility] for tasks assessed/performed           Past Medical History:  Diagnosis Date  . Allergic rhinitis   . Arthritis    shoulder, neck, back  . Chronic back pain   . GAD (generalized anxiety disorder)   . GERD (gastroesophageal reflux disease)   . History of asthma    child  . Hyperlipidemia   . Hypertension   . MDD (major depressive disorder)   . OSA on CPAP    07-15-2020  per pt uses approx. 3 times weekly  . Peripheral neuropathy   . Pre-diabetes   . Rotator cuff tear, right   . Wears glasses     Past Surgical History:  Procedure Laterality Date  . COLONOSCOPY WITH PROPOFOL  05/31/2020  . LUMBAR SPINE SURGERY  11/2014  . SHOULDER ARTHROSCOPY WITH ROTATOR CUFF REPAIR AND SUBACROMIAL DECOMPRESSION Right 07/16/2020   Procedure: SHOULDER ARTHROSCOPY WITH ROTATOR CUFF REPAIR AND SUBACROMIAL DECOMPRESSION;  Surgeon: Renette Butters, MD;  Location: [place];  Service: Orthopedics;  Laterality: Right;    There were no vitals filed for this visit.   Subjective Assessment - 08/07/20 0918    Subjective "Not really any pain right now. My back started hurting doing the chin tucks but other than that, the shoulder feels like it has really  loosened up."    Limitations Lifting;House hold activities;Writing    Patient Stated Goals to reduce pain, use shoulder    Currently in Pain? No/denies    Pain Score 0-No pain    Pain Onset 1 to 4 weeks ago              [facility] PT Assessment - 08/07/20 0001      Assessment   Medical Diagnosis R rotator cuff repair    Referring Provider (PT) Edmonia Lynch, MD    Onset Date/Surgical Date 07/16/20    Next MD Visit 08/22/20                         [facility] Adult PT Treatment/Exercise - 08/07/20 0001      Self-Care   Self-Care Other Self-Care Comments    Other Self-Care Comments  See patient education      Shoulder Exercises: Seated   Retraction Strengthening;20 reps    Retraction Limitations within pain free range with RUE propped/resting. Tactile cues for scapular retraction and cues to avoid shoulder AROM    Other Seated Exercises Passive R elbow FL/EXT using LUE      Hand Exercises   Rubberbands Resisted digit EXT (green resistance) x 30    Other Hand Exercises Green digiflex x 20 each digit  Other Hand Exercises Wrist FL and EXT with 2# for each 2 x 15      Manual Therapy   Manual Therapy Passive ROM    Manual therapy comments STM along R shoulder, pec minor, and biceps    Passive ROM R shoulder PROM in FL, ABD, and IR within pain free ranges. Forearm supination/pronation and elbow FL/EXT PROM within pain free range                  PT Education - 08/07/20 1158    Education Details Reviewed HEP and proper technique with scapular retractions (cues to sit upright, activate core) due to pt feeling his LBP when performing at home - reported he was performing more lumbar EXT when completing at home. Reviewed precautions/to avoid AROM to allow for tissue healing    Horsley(s) Educated Patient    Methods Explanation;Demonstration    Comprehension Verbalized understanding;Returned demonstration            PT Short Term Goals - 08/01/20 1800      PT  SHORT TERM GOAL #1   Title Pt will be independent and compliant with initial HEP.    Baseline provided today    Time 2    Period Weeks    Status New    Target Date 08/15/20      PT SHORT TERM GOAL #2   Title Pt will restore full PROM and initiate AAROM by 6 weeks.    Baseline significant muscle guarding    Time 4    Period Weeks    Status New    Target Date 08/29/20             PT Long Term Goals - 08/01/20 1801      PT LONG TERM GOAL #1   Title Pt will initiate AROM and gentle isometric strengthening    Baseline PROM initiated at eval    Time 6    Period Weeks    Status New    Target Date 09/26/20      PT LONG TERM GOAL #2   Title Pt will demonstrate R shoulder ROM WFL without pain as needed for ADLs including bathing and dressing.    Time 8    Period Weeks    Status New    Target Date 09/26/20      PT LONG TERM GOAL #3   Title Pt will demonstrate 4+/5 R shoulder MMT without pain to perform leisure activities.    Baseline NT    Time 8    Period Weeks    Status New    Target Date 09/26/20      PT LONG TERM GOAL #4   Title Pt will be able to lift/lower >/= 15# from waist to [facility] and floor to waist for functional mobility.    Time 8    Period Weeks    Status New    Target Date 09/26/20                 Plan - 08/07/20 1200    Clinical Impression Statement Patient tolerated session well with no adverse effects or complaints of pain. He was able to perform wrist and hand exercises and returned demonstration of using LUE for passive R elbow and forearm ROM. He explains that his shoulder feels less guarded with decreased tightness in his neck. He will continue to benefit from skilled PT intervention with focus on R shoulder and elbow PROM in addition to wrist/hand exercises until  cleared to begin A/AROM.    Personal Factors and Comorbidities Comorbidity 3+;Past/Current Experience;Time since onset of injury/illness/exacerbation    Comorbidities HTN, HLD,  Arthritis    Examination-Activity Limitations Bed Mobility;Bathing;Reach Overhead;Carry;Lift;Dressing    Examination-Participation Restrictions Driving;Cleaning    Stability/Clinical Decision Making Stable/Uncomplicated    Rehab Potential Good    PT Frequency 2x / week    PT Duration 8 weeks    PT Treatment/Interventions Spinal Manipulations;Joint Manipulations;Vasopneumatic Device;Taping;Dry needling;Passive range of motion;Manual techniques;Patient/family education;Therapeutic exercise;Neuromuscular re-education;ADLs/Self Care Home Management;Cryotherapy;Electrical Stimulation;Iontophoresis 4mg /ml Dexamethasone;Moist Heat;Therapeutic activities    PT Next Visit Plan Assess response to HEP/update PRN, passive 6 weeks, sling until further notice from MD, manual for PROM    PT Home Exercise Plan [id number]: pendulums, PROM flexion on counter/table, wrist flexion/ext stretch, wrist and digit AROM, scapular and cervical retractions, forearm pronation/supination and elbow FL/EXT PROM with UE supported on surface, putty squeezes    Consulted and Agree with Plan of Care Patient           Patient will benefit from skilled therapeutic intervention in order to improve the following deficits and impairments:  Pain,Postural dysfunction,Impaired UE functional use,Hypomobility,Decreased strength,Impaired flexibility,Increased fascial restricitons,Impaired perceived functional ability,Increased edema,Decreased range of motion  Visit Diagnosis: S/P right rotator cuff repair  Biceps tendonosis of right shoulder  Chronic right shoulder pain     Problem List Patient Active Problem List   Diagnosis Date Noted  . TIA (transient ischemic attack) 06/02/2018  . Hypertension 06/02/2018  . Lower back injury 12/07/2013    Haydee Monica, PT, DPT 08/07/20 12:04 PM  [facility] [address] [place], [place], [address] Phone: [phone]   Fax:   [phone]  Name: Todd Ryan MRN: [id number] Date of Birth: 11-02-1960

## 2020-08-13 ENCOUNTER — Ambulatory Visit: Payer: Medicaid Other | Admitting: Physical Therapy

## 2020-08-13 ENCOUNTER — Other Ambulatory Visit: Payer: Self-pay

## 2020-08-13 ENCOUNTER — Encounter: Payer: Self-pay | Admitting: Physical Therapy

## 2020-08-13 DIAGNOSIS — G8929 Other chronic pain: Secondary | ICD-10-CM

## 2020-08-13 DIAGNOSIS — M67813 Other specified disorders of tendon, right shoulder: Secondary | ICD-10-CM

## 2020-08-13 DIAGNOSIS — Z9889 Other specified postprocedural states: Secondary | ICD-10-CM

## 2020-08-13 NOTE — Therapy (Signed)
[place], [place], [address] Phone: [phone]   Fax:  [phone]  Physical Therapy Treatment  Patient Details  Name: MELTON WALLS MRN: [id number] Date of Birth: [birth year] Referring Provider (PT): Edmonia Lynch, MD   Encounter Date: 08/13/2020   PT End of Session - 08/13/20 0936    Visit Number 4    Number of Visits 27    Date for PT Re-Evaluation 09/26/20    Authorization Type UHC Medicaid    PT Start Time [phone]    PT Stop Time 0930    PT Time Calculation (min) 40 min    Equipment Utilized During Treatment Other (comment)   sling   Activity Tolerance Patient tolerated treatment well    Behavior During Therapy [facility] for tasks assessed/performed           Past Medical History:  Diagnosis Date  . Allergic rhinitis   . Arthritis    shoulder, neck, back  . Chronic back pain   . GAD (generalized anxiety disorder)   . GERD (gastroesophageal reflux disease)   . History of asthma    child  . Hyperlipidemia   . Hypertension   . MDD (major depressive disorder)   . OSA on CPAP    07-15-2020  per pt uses approx. 3 times weekly  . Peripheral neuropathy   . Pre-diabetes   . Rotator cuff tear, right   . Wears glasses     Past Surgical History:  Procedure Laterality Date  . COLONOSCOPY WITH PROPOFOL  05/31/2020  . LUMBAR SPINE SURGERY  11/2014  . SHOULDER ARTHROSCOPY WITH ROTATOR CUFF REPAIR AND SUBACROMIAL DECOMPRESSION Right 07/16/2020   Procedure: SHOULDER ARTHROSCOPY WITH ROTATOR CUFF REPAIR AND SUBACROMIAL DECOMPRESSION;  Surgeon: Renette Butters, MD;  Location: [place];  Service: Orthopedics;  Laterality: Right;    There were no vitals filed for this visit.   Subjective Assessment - 08/13/20 0853    Subjective I still have pain but less than it was before.    Limitations Lifting;House hold activities;Writing    Currently in Pain? No/denies    Pain Score 0-No pain     Pain Location Shoulder    Pain Orientation Right    Pain Type Acute pain;Surgical pain    Pain Onset 1 to 4 weeks ago              [facility] PT Assessment - 08/13/20 0001      PROM   Overall PROM Comments no pain with any motion, just tightness    Right Shoulder Flexion 80 Degrees    Right Shoulder ABduction 90 Degrees    Right Shoulder Internal Rotation 45 Degrees    Right Shoulder External Rotation 15 Degrees                         [facility] Adult PT Treatment/Exercise - 08/13/20 0001      Exercises   Exercises Shoulder      Shoulder Exercises: Seated   Retraction Strengthening;20 reps    Retraction Limitations within pain free range with RUE propped/resting. Tactile cues for scapular retraction and cues to avoid shoulder AROM    Other Seated Exercises cervical retraction x 10    Other Seated Exercises Passive R elbow FL/EXT using LUE      Shoulder Exercises: Standing   Other Standing Exercises Pendulums forward /backwards , side to side and circles CCW and CW  Hand Exercises   Rubberbands Resisted digit EXT (green resistance) x 30    Other Hand Exercises Green digiflex x 20 each digit    Other Hand Exercises Wrist FL and EXT with 3# for each 2 x 15      Manual Therapy   Manual Therapy Passive ROM    Manual therapy comments STM along R shoulder, pec minor, and biceps, scapular mobs in supine by PT    Passive ROM R shoulder PROM in FL, ABD, and IR within pain free ranges. Forearm supination/pronation and elbow FL/EXT PROM within pain free range                    PT Short Term Goals - 08/13/20 0937      PT SHORT TERM GOAL #1   Title Pt will be independent and compliant with initial HEP.    Baseline Pt independent with current HEP    Time 2    Period Weeks    Status Achieved    Target Date 08/15/20      PT SHORT TERM GOAL #2   Title Pt will restore full PROM and initiate AAROM by 6 weeks.    Baseline abd 90, flex 80, ER 15 PROM no  AAROM right now until cleared by MD    Time 4    Period Weeks    Status On-going    Target Date 08/29/20             PT Long Term Goals - 08/01/20 1801      PT LONG TERM GOAL #1   Title Pt will initiate AROM and gentle isometric strengthening    Baseline PROM initiated at eval    Time 6    Period Weeks    Status New    Target Date 09/26/20      PT LONG TERM GOAL #2   Title Pt will demonstrate R shoulder ROM WFL without pain as needed for ADLs including bathing and dressing.    Time 8    Period Weeks    Status New    Target Date 09/26/20      PT LONG TERM GOAL #3   Title Pt will demonstrate 4+/5 R shoulder MMT without pain to perform leisure activities.    Baseline NT    Time 8    Period Weeks    Status New    Target Date 09/26/20      PT LONG TERM GOAL #4   Title Pt will be able to lift/lower >/= 15# from waist to [facility] and floor to waist for functional mobility.    Time 8    Period Weeks    Status New    Target Date 09/26/20                 Plan - 08/13/20 0927    Clinical Impression Statement Pt denies pain with any movements just tightness with PROM to 80 flex, 90 abd and 15 degrees ER.  Tolerated RX well with no adverse effects or complaints.  Pt should have MD appt for post op check but he believes he is not getting messages.  Pt was advised to contact MD for post op visit. Pt reviewed HEP for self PROM of Left shoulder to assist with R elbow /forearm exercises.  Pt will continue sling wear and focus on R shld/ elbow PROM until cleared to begin A/AROM by MD. continue PROM shld and elbow and wrist/hand exercise  Personal Factors and Comorbidities Comorbidity 3+;Past/Current Experience;Time since onset of injury/illness/exacerbation    Comorbidities HTN, HLD, Arthritis    Examination-Activity Limitations Bed Mobility;Bathing;Reach Overhead;Carry;Lift;Dressing    Examination-Participation Restrictions Driving;Cleaning    PT Treatment/Interventions Spinal  Manipulations;Joint Manipulations;Vasopneumatic Device;Taping;Dry needling;Passive range of motion;Manual techniques;Patient/family education;Therapeutic exercise;Neuromuscular re-education;ADLs/Self Care Home Management;Cryotherapy;Electrical Stimulation;Iontophoresis 4mg /ml Dexamethasone;Moist Heat;Therapeutic activities    PT Next Visit Plan Assess response to HEP/update PRN, passive 6 weeks, sling until further notice from MD, manual for PROM going to call MD about next MD appt,at 4 weeks since he is unsure of next appt    PT Home Exercise Plan [id number]: pendulums, PROM flexion on counter/table, wrist flexion/ext stretch, wrist and digit AROM, scapular and cervical retractions, forearm pronation/supination and elbow FL/EXT PROM with UE supported on surface, putty squeezes    Consulted and Agree with Plan of Care Patient           Patient will benefit from skilled therapeutic intervention in order to improve the following deficits and impairments:  Pain,Postural dysfunction,Impaired UE functional use,Hypomobility,Decreased strength,Impaired flexibility,Increased fascial restricitons,Impaired perceived functional ability,Increased edema,Decreased range of motion  Visit Diagnosis: S/P right rotator cuff repair  Biceps tendonosis of right shoulder  Chronic right shoulder pain     Problem List Patient Active Problem List   Diagnosis Date Noted  . TIA (transient ischemic attack) 06/02/2018  . Hypertension 06/02/2018  . Lower back injury 12/07/2013   Voncille Lo, PT, [place] Certified Exercise Expert for the Aging Adult  08/13/20 9:38 AM Phone: [phone] Fax: [facility] [address] [place], [place], [address] Phone: [phone]   Fax:  [phone]  Name: JOSEANDRES MAZER MRN: [id number] Date of Birth: Jan 15, 1961

## 2020-08-16 ENCOUNTER — Ambulatory Visit: Payer: Medicaid Other | Admitting: Physical Therapy

## 2020-08-19 ENCOUNTER — Ambulatory Visit: Payer: Medicaid Other | Admitting: Physical Therapy

## 2020-08-21 ENCOUNTER — Other Ambulatory Visit: Payer: Self-pay

## 2020-08-21 ENCOUNTER — Encounter: Payer: Self-pay | Admitting: Physical Therapy

## 2020-08-21 ENCOUNTER — Ambulatory Visit: Payer: Medicaid Other | Admitting: Physical Therapy

## 2020-08-21 DIAGNOSIS — Z9889 Other specified postprocedural states: Secondary | ICD-10-CM | POA: Diagnosis not present

## 2020-08-21 DIAGNOSIS — M67813 Other specified disorders of tendon, right shoulder: Secondary | ICD-10-CM

## 2020-08-21 DIAGNOSIS — G8929 Other chronic pain: Secondary | ICD-10-CM

## 2020-08-21 NOTE — Patient Instructions (Signed)
Access Code: [email or web address] URL: [email or web address] Date: 08/21/2020 Prepared by: Hessie Diener  Exercises Flexion-Extension Shoulder Pendulum with Table Support - 3-5 x daily - 7 x weekly - 30 seconds-1 minute hold Horizontal Shoulder Pendulum with Table Support - 3-5 x daily - 7 x weekly - 30 seconds-1 minute hold Seated Wrist Flexion Extension PROM - 3-5 x daily - 7 x weekly - 3 sets - 10-20 seconds hold Supine Cervical Retraction with Towel - 3-5 x daily - 7 x weekly - 1-2 sets - 10 reps Resisted Finger Extension and Thumb Abduction - 3-5 x daily - 7 x weekly - 1-2 sets - 15 reps Wrist AROM Flexion Extension - 3-5 x daily - 7 x weekly - 1-2 sets - 15 reps Forearm Pronation PROM - 3-5 x daily - 7 x weekly - 1-2 sets - 10 reps Seated Scapular Retraction - 3-5 x daily - 7 x weekly - 1-2 sets - 10 reps Putty Squeezes - 3-5 x daily - 7 x weekly - 1-2 sets - 30 reps Supine Shoulder External Rotation with Dowel - 1 x daily - 7 x weekly - 2 sets - 10 reps Supine Shoulder Press AAROM in Abduction with Dowel - 1 x daily - 7 x weekly - 2 sets - 10 reps Supine shoulder Flexion Extension AAROM with Dowel to comfortable height overhead - 1 x daily - 7 x weekly - 2 sets - 10 reps

## 2020-08-21 NOTE — Therapy (Signed)
[place], [place], [address] Phone: [phone]   Fax:  [phone]  Physical Therapy Treatment  Patient Details  Name: Todd Ryan MRN: [id number] Date of Birth: [birth year] Referring Provider (PT): Edmonia Lynch, MD   Encounter Date: 08/21/2020   PT End of Session - 08/21/20 0808    Visit Number 5    Number of Visits 27    Date for PT Re-Evaluation 09/26/20    Authorization Type [employer] Medicaid    PT Start Time 0800    PT Stop Time 0838    PT Time Calculation (min) 38 min           Past Medical History:  Diagnosis Date  . Allergic rhinitis   . Arthritis    shoulder, neck, back  . Chronic back pain   . GAD (generalized anxiety disorder)   . GERD (gastroesophageal reflux disease)   . History of asthma    child  . Hyperlipidemia   . Hypertension   . MDD (major depressive disorder)   . OSA on CPAP    07-15-2020  per pt uses approx. 3 times weekly  . Peripheral neuropathy   . Pre-diabetes   . Rotator cuff tear, right   . Wears glasses     Past Surgical History:  Procedure Laterality Date  . COLONOSCOPY WITH PROPOFOL  05/31/2020  . LUMBAR SPINE SURGERY  11/2014  . SHOULDER ARTHROSCOPY WITH ROTATOR CUFF REPAIR AND SUBACROMIAL DECOMPRESSION Right 07/16/2020   Procedure: SHOULDER ARTHROSCOPY WITH ROTATOR CUFF REPAIR AND SUBACROMIAL DECOMPRESSION;  Surgeon: Renette Butters, MD;  Location: [facility];  Service: Orthopedics;  Laterality: Right;    There were no vitals filed for this visit.   Subjective Assessment - 08/21/20 0802    Subjective Saw MD 2 days ago for 6 week F/U , I have a new referral for active motion. He discontinued the sling and he said I could start using the arm more.  He said I could crawl under the house and I did yesterday.    Currently in Pain? Yes    Pain Score 2     Pain Location Shoulder    Pain Orientation Right    Pain Descriptors / Indicators  Constant    Pain Type Acute pain;Surgical pain    Aggravating Factors  using arm, moving certain ways    Pain Relieving Factors rest              [facility] PT Assessment - 08/21/20 0001      AROM   AROM Assessment Site Shoulder    Right/Left Shoulder Right    Right Shoulder Flexion 88 Degrees    Right Shoulder ABduction 65 Degrees    Right Shoulder Internal Rotation --   reach L3   Right Shoulder External Rotation --   reach back of neck                        [facility] Adult PT Treatment/Exercise - 08/21/20 0001      Shoulder Exercises: Supine   Other Supine Exercises supine dowel: chest press x 20, pullover x 20 ER x 20      Shoulder Exercises: Seated   Retraction Strengthening;20 reps   standing     Shoulder Exercises: Standing   Other Standing Exercises Pendulums forward /backwards , side to side and circles CCW and CW      Shoulder Exercises: Stretch   Table  Stretch - Flexion 5 reps    Table Stretch - External Rotation 5 reps      Hand Exercises   Other Hand Exercises AROM elbow flexion    Other Hand Exercises Wrist FL and EXT with 3# for each 2 x 15      Manual Therapy   Manual therapy comments gentle ghjoint mobs A/P and inferior glides followed by PROM    Passive ROM Right shoulder PROM flexion, IR, Er as tolerated                    PT Short Term Goals - 08/13/20 9767      PT SHORT TERM GOAL #1   Title Pt will be independent and compliant with initial HEP.    Baseline Pt independent with current HEP    Time 2    Period Weeks    Status Achieved    Target Date 08/15/20      PT SHORT TERM GOAL #2   Title Pt will restore full PROM and initiate AAROM by 6 weeks.    Baseline abd 90, flex 80, ER 15 PROM no AAROM right now until cleared by MD    Time 4    Period Weeks    Status On-going    Target Date 08/29/20             PT Long Term Goals - 08/01/20 1801      PT LONG TERM GOAL #1   Title Pt will initiate AROM and gentle  isometric strengthening    Baseline PROM initiated at eval    Time 6    Period Weeks    Status New    Target Date 09/26/20      PT LONG TERM GOAL #2   Title Pt will demonstrate R shoulder ROM WFL without pain as needed for ADLs including bathing and dressing.    Time 8    Period Weeks    Status New    Target Date 09/26/20      PT LONG TERM GOAL #3   Title Pt will demonstrate 4+/5 R shoulder MMT without pain to perform leisure activities.    Baseline NT    Time 8    Period Weeks    Status New    Target Date 09/26/20      PT LONG TERM GOAL #4   Title Pt will be able to lift/lower >/= 15# from waist to [facility] and floor to waist for functional mobility.    Time 8    Period Weeks    Status New    Target Date 09/26/20                 Plan - 08/21/20 3419    Clinical Impression Statement Pt arrives with new MD order for active motion. Continued with PROM and began Active assit for HEP. Pt tolerated session well with soreness at end of session.  He declined modalities.    PT Next Visit Plan Assess response to HEP/update PRN, new MD order on 08/19/20- active motion    PT Home Exercise Plan [email or web address]: pendulums, PROM flexion on counter/table, wrist flexion/ext stretch, wrist and digit AROM, scapular and cervical retractions, forearm pronation/supination and elbow FL/EXT PROM with UE supported on surface, putty squeezes, 08/21/20- added supine chest press, pullovers, ER AAROM , Seated ER PROM with table           Patient will benefit from skilled therapeutic intervention in order to improve the  following deficits and impairments:  Pain,Postural dysfunction,Impaired UE functional use,Hypomobility,Decreased strength,Impaired flexibility,Increased fascial restricitons,Impaired perceived functional ability,Increased edema,Decreased range of motion  Visit Diagnosis: S/P right rotator cuff repair  Biceps tendonosis of right shoulder  Chronic right shoulder pain     Problem  List Patient Active Problem List   Diagnosis Date Noted  . TIA (transient ischemic attack) 06/02/2018  . Hypertension 06/02/2018  . Lower back injury 12/07/2013    Dorene Ar, PTA 08/21/2020, 9:16 AM  [facility] [address] [place], [place], [address] Phone: [phone]   Fax:  [phone]  Name: Todd Ryan MRN: [id number] Date of Birth: Jan 22, 1961

## 2020-08-26 ENCOUNTER — Ambulatory Visit: Payer: Medicaid Other

## 2020-08-26 ENCOUNTER — Other Ambulatory Visit: Payer: Self-pay

## 2020-08-26 DIAGNOSIS — Z9889 Other specified postprocedural states: Secondary | ICD-10-CM

## 2020-08-26 DIAGNOSIS — M67813 Other specified disorders of tendon, right shoulder: Secondary | ICD-10-CM

## 2020-08-26 DIAGNOSIS — G8929 Other chronic pain: Secondary | ICD-10-CM

## 2020-08-26 NOTE — Therapy (Signed)
Todd Ryan, [place], [address] Phone: [phone]   Fax:  [phone]  Physical Therapy Treatment  Patient Details  Name: Todd Ryan MRN: [id number] Date of Birth: 1960/08/12 Referring Provider (PT): Edmonia Lynch, MD   Encounter Date: 08/26/2020   PT End of Session - 08/26/20 0744    Visit Number 6    Number of Visits 27    Date for PT Re-Evaluation 09/26/20    Authorization Type [employer] Medicaid    PT Start Time 0745    PT Stop Time 0826    PT Time Calculation (min) 41 min    Activity Tolerance Patient tolerated treatment well    Behavior During Therapy [facility] for tasks assessed/performed           Past Medical History:  Diagnosis Date  . Allergic rhinitis   . Arthritis    shoulder, neck, back  . Chronic back pain   . GAD (generalized anxiety disorder)   . GERD (gastroesophageal reflux disease)   . History of asthma    child  . Hyperlipidemia   . Hypertension   . MDD (major depressive disorder)   . OSA on CPAP    07-15-2020  per pt uses approx. 3 times weekly  . Peripheral neuropathy   . Pre-diabetes   . Rotator cuff tear, right   . Wears glasses     Past Surgical History:  Procedure Laterality Date  . COLONOSCOPY WITH PROPOFOL  05/31/2020  . LUMBAR SPINE SURGERY  11/2014  . SHOULDER ARTHROSCOPY WITH ROTATOR CUFF REPAIR AND SUBACROMIAL DECOMPRESSION Right 07/16/2020   Procedure: SHOULDER ARTHROSCOPY WITH ROTATOR CUFF REPAIR AND SUBACROMIAL DECOMPRESSION;  Surgeon: Renette Butters, MD;  Location: [facility];  Service: Orthopedics;  Laterality: Right;    There were no vitals filed for this visit.   Subjective Assessment - 08/26/20 0744    Subjective Pt reports using his right arm more since his last doctor's appointment and reports that he has been doing everything mostly, but he does not have as much motion reaching overhead.    Limitations Lifting;House hold  activities;Writing    Patient Stated Goals to reduce pain, use shoulder    Currently in Pain? Yes    Pain Score 3     Pain Location Shoulder    Pain Orientation Right    Pain Descriptors / Indicators Constant    Pain Type Acute pain;Surgical pain    Pain Onset 1 to 4 weeks ago    Effect of Pain on Daily Activities reaching overhead primarily - decreased motion overall that makes daily activities more difficult              [facility] PT Assessment - 08/26/20 0001      Assessment   Medical Diagnosis R rotator cuff repair    Referring Provider (PT) Edmonia Lynch, MD    Onset Date/Surgical Date 07/16/20      AROM   AROM Assessment Site Shoulder    Right/Left Shoulder Right    Right Shoulder Flexion 110 Degrees    Right Shoulder ABduction 68 Degrees    Right Shoulder Internal Rotation --   L3   Right Shoulder External Rotation --   back of neck - pinky below occiput                        [facility] Adult PT Treatment/Exercise - 08/26/20 0001      Shoulder Exercises:  Supine   Other Supine Exercises supine dowel: chest press x 20, FL overhead x 20, ER x 20, ABD x 20      Shoulder Exercises: Standing   Retraction Strengthening;Both;20 reps      Shoulder Exercises: Pulleys   Flexion Limitations FL x 15    ABduction Limitations Scaption x 15      Shoulder Exercises: ROM/Strengthening   UBE (Upper Arm Bike) L1 x 4 min (2 min forward, 2 min backward)    Other ROM/Strengthening Exercises Wall wash FL x 10 with 3-5 sec hold at end range FL within tolerance      Manual Therapy   Manual Therapy Soft tissue mobilization;Passive ROM;Joint mobilization    Manual therapy comments STM along R shoulder, pec minor, and biceps, scapular mobs in supine by PT    Joint Mobilization Grades I-III inferior joint mobilization at 60 deg R shoulder ABD    Passive ROM R shoulder passive ABD, ER, and IR within pain free ranges                    PT Short Term Goals - 08/13/20  0937      PT SHORT TERM GOAL #1   Title Pt will be independent and compliant with initial HEP.    Baseline Pt independent with current HEP    Time 2    Period Weeks    Status Achieved    Target Date 08/15/20      PT SHORT TERM GOAL #2   Title Pt will restore full PROM and initiate AAROM by 6 weeks.    Baseline abd 90, flex 80, ER 15 PROM no AAROM right now until cleared by MD    Time 4    Period Weeks    Status On-going    Target Date 08/29/20             PT Long Term Goals - 08/01/20 1801      PT LONG TERM GOAL #1   Title Pt will initiate AROM and gentle isometric strengthening    Baseline PROM initiated at eval    Time 6    Period Weeks    Status New    Target Date 09/26/20      PT LONG TERM GOAL #2   Title Pt will demonstrate R shoulder ROM WFL without pain as needed for ADLs including bathing and dressing.    Time 8    Period Weeks    Status New    Target Date 09/26/20      PT LONG TERM GOAL #3   Title Pt will demonstrate 4+/5 R shoulder MMT without pain to perform leisure activities.    Baseline NT    Time 8    Period Weeks    Status New    Target Date 09/26/20      PT LONG TERM GOAL #4   Title Pt will be able to lift/lower >/= 15# from waist to [facility] and floor to waist for functional mobility.    Time 8    Period Weeks    Status New    Target Date 09/26/20                 Plan - 08/26/20 0744    Clinical Impression Statement Patient had good tolerance with AAROM activities this session and demonstrates improvement in R shoulder FL AROM. He should continue to benefit from skilled PT to improve mobility and strength to progress towards return to  daily activities.    Personal Factors and Comorbidities Comorbidity 3+;Past/Current Experience;Time since onset of injury/illness/exacerbation    Comorbidities HTN, HLD, Arthritis    Examination-Activity Limitations Bed Mobility;Bathing;Reach Overhead;Carry;Lift;Dressing    Examination-Participation  Restrictions Driving;Cleaning    PT Treatment/Interventions Spinal Manipulations;Joint Manipulations;Vasopneumatic Device;Taping;Dry needling;Passive range of motion;Manual techniques;Patient/family education;Therapeutic exercise;Neuromuscular re-education;ADLs/Self Care Home Management;Cryotherapy;Electrical Stimulation;Iontophoresis 4mg /ml Dexamethasone;Moist Heat;Therapeutic activities    PT Next Visit Plan Assess response to HEP/update PRN, new MD order on 08/19/20- active motion    PT Home Exercise Plan [email or web address]: pendulums, PROM flexion on counter/table, wrist flexion/ext stretch, wrist and digit AROM, scapular and cervical retractions, forearm pronation/supination and elbow FL/EXT PROM with UE supported on surface, putty squeezes, 08/21/20- added supine chest press, pullovers, ER AAROM , Seated ER PROM with table    Consulted and Agree with Plan of Care Patient           Patient will benefit from skilled therapeutic intervention in order to improve the following deficits and impairments:  Pain,Postural dysfunction,Impaired UE functional use,Hypomobility,Decreased strength,Impaired flexibility,Increased fascial restricitons,Impaired perceived functional ability,Increased edema,Decreased range of motion  Visit Diagnosis: S/P right rotator cuff repair  Biceps tendonosis of right shoulder  Chronic right shoulder pain     Problem List Patient Active Problem List   Diagnosis Date Noted  . TIA (transient ischemic attack) 06/02/2018  . Hypertension 06/02/2018  . Lower back injury 12/07/2013      Haydee Monica, PT, DPT 08/26/20 9:38 AM  [facility] [address] [place], [place], [address] Phone: [phone]   Fax:  [phone]  Name: JAHMARION POPOFF MRN: [id number] Date of Birth: 1960-10-09

## 2020-08-28 ENCOUNTER — Other Ambulatory Visit: Payer: Self-pay

## 2020-08-28 ENCOUNTER — Encounter: Payer: Self-pay | Admitting: Physical Therapy

## 2020-08-28 ENCOUNTER — Ambulatory Visit: Payer: Medicaid Other | Attending: Orthopedic Surgery | Admitting: Physical Therapy

## 2020-08-28 DIAGNOSIS — M67813 Other specified disorders of tendon, right shoulder: Secondary | ICD-10-CM | POA: Diagnosis present

## 2020-08-28 DIAGNOSIS — M25511 Pain in right shoulder: Secondary | ICD-10-CM | POA: Insufficient documentation

## 2020-08-28 DIAGNOSIS — Z9889 Other specified postprocedural states: Secondary | ICD-10-CM | POA: Diagnosis present

## 2020-08-28 DIAGNOSIS — G8929 Other chronic pain: Secondary | ICD-10-CM | POA: Insufficient documentation

## 2020-08-28 NOTE — Therapy (Signed)
[place], [place], [address] Phone: [phone]   Fax:  [phone]  Physical Therapy Treatment  Patient Details  Name: Todd Ryan MRN: [id number] Date of Birth: Nov 29, 1960 Referring Provider (PT): Edmonia Lynch, MD   Encounter Date: 08/28/2020   PT End of Session - 08/28/20 0812    Visit Number 7    Number of Visits 27    Date for PT Re-Evaluation 09/26/20    Authorization Type [employer] Medicaid    PT Start Time 0800    PT Stop Time 0840    PT Time Calculation (min) 40 min           Past Medical History:  Diagnosis Date  . Allergic rhinitis   . Arthritis    shoulder, neck, back  . Chronic back pain   . GAD (generalized anxiety disorder)   . GERD (gastroesophageal reflux disease)   . History of asthma    child  . Hyperlipidemia   . Hypertension   . MDD (major depressive disorder)   . OSA on CPAP    07-15-2020  per pt uses approx. 3 times weekly  . Peripheral neuropathy   . Pre-diabetes   . Rotator cuff tear, right   . Wears glasses     Past Surgical History:  Procedure Laterality Date  . COLONOSCOPY WITH PROPOFOL  05/31/2020  . LUMBAR SPINE SURGERY  11/2014  . SHOULDER ARTHROSCOPY WITH ROTATOR CUFF REPAIR AND SUBACROMIAL DECOMPRESSION Right 07/16/2020   Procedure: SHOULDER ARTHROSCOPY WITH ROTATOR CUFF REPAIR AND SUBACROMIAL DECOMPRESSION;  Surgeon: Renette Butters, MD;  Location: [facility];  Service: Orthopedics;  Laterality: Right;    There were no vitals filed for this visit.   Subjective Assessment - 08/28/20 0810    Subjective Pt arrives without pain. He liked the workout last session and had no lasting soreness.    Currently in Pain? No/denies              [facility] PT Assessment - 08/28/20 0001      PROM   Right Shoulder Flexion 142 Degrees    Right Shoulder ABduction 120 Degrees                         [facility] Adult PT  Treatment/Exercise - 08/28/20 0001      Shoulder Exercises: Supine   Other Supine Exercises supine pullovers x 20      Shoulder Exercises: Standing   Retraction Strengthening;Both;20 reps    Other Standing Exercises standing AAROM cane shoulder Ext, IR,ER,  flexion and scaption x 10 each      Shoulder Exercises: Pulleys   Flexion 1 minute    ABduction 1 minute      Shoulder Exercises: ROM/Strengthening   UBE (Upper Arm Bike) L1 x 4 min (2 min forward, 2 min backward)    Other ROM/Strengthening Exercises Wall Ladder abduction x 15 approx 110 AAROM    Other ROM/Strengthening Exercises Wall wash FL x 15 with 3-5 sec hold at end range FL within tolerance      Manual Therapy   Manual therapy comments gentle ghjoint mobs A/P and inferior glides followed by PROM    Passive ROM R shoulder passive ABD, ER, and IR within pain free ranges                    PT Short Term Goals - 08/28/20 7902  PT SHORT TERM GOAL #1   Title Pt will be independent and compliant with initial HEP.    Baseline Pt independent with current HEP    Time 2    Period Weeks    Status Achieved    Target Date 08/15/20      PT SHORT TERM GOAL #2   Title Pt will restore full PROM and initiate AAROM by 6 weeks.    Baseline cleared for AROM by MD    Time 4    Period Weeks    Status Achieved             PT Long Term Goals - 08/01/20 1801      PT LONG TERM GOAL #1   Title Pt will initiate AROM and gentle isometric strengthening    Baseline PROM initiated at eval    Time 6    Period Weeks    Status New    Target Date 09/26/20      PT LONG TERM GOAL #2   Title Pt will demonstrate R shoulder ROM WFL without pain as needed for ADLs including bathing and dressing.    Time 8    Period Weeks    Status New    Target Date 09/26/20      PT LONG TERM GOAL #3   Title Pt will demonstrate 4+/5 R shoulder MMT without pain to perform leisure activities.    Baseline NT    Time 8    Period Weeks     Status New    Target Date 09/26/20      PT LONG TERM GOAL #4   Title Pt will be able to lift/lower >/= 15# from waist to [facility] and floor to waist for functional mobility.    Time 8    Period Weeks    Status New    Target Date 09/26/20                 Plan - 08/28/20 1505    Clinical Impression Statement Continued with AAROM exercises and manual therapy. Patient tolerates all therex well with min increased pain at end ranges. PROM improved for flexion and abduction. STG#2 met.    PT Next Visit Plan Assess response to HEP/update PRN, new MD order on 08/19/20- active motion    PT Home Exercise Plan [email or web address]: pendulums, PROM flexion on counter/table, wrist flexion/ext stretch, wrist and digit AROM, scapular and cervical retractions, forearm pronation/supination and elbow FL/EXT PROM with UE supported on surface, putty squeezes, 08/21/20- added supine chest press, pullovers, ER AAROM , Seated ER PROM with table           Patient will benefit from skilled therapeutic intervention in order to improve the following deficits and impairments:  Pain,Postural dysfunction,Impaired UE functional use,Hypomobility,Decreased strength,Impaired flexibility,Increased fascial restricitons,Impaired perceived functional ability,Increased edema,Decreased range of motion  Visit Diagnosis: S/P right rotator cuff repair  Biceps tendonosis of right shoulder  Chronic right shoulder pain     Problem List Patient Active Problem List   Diagnosis Date Noted  . TIA (transient ischemic attack) 06/02/2018  . Hypertension 06/02/2018  . Lower back injury 12/07/2013    Dorene Ar, PTA 08/28/2020, 9:07 AM  [facility] [address] [place], [place], [address] Phone: [phone]   Fax:  [phone]  Name: WIATT MAHABIR MRN: [id number] Date of Birth: 02/13/1961

## 2020-09-02 ENCOUNTER — Other Ambulatory Visit: Payer: Self-pay

## 2020-09-02 ENCOUNTER — Ambulatory Visit: Payer: Medicaid Other

## 2020-09-02 DIAGNOSIS — Z9889 Other specified postprocedural states: Secondary | ICD-10-CM

## 2020-09-02 DIAGNOSIS — M67813 Other specified disorders of tendon, right shoulder: Secondary | ICD-10-CM

## 2020-09-02 DIAGNOSIS — G8929 Other chronic pain: Secondary | ICD-10-CM

## 2020-09-02 NOTE — Therapy (Signed)
[place], [place], [address] Phone: [phone]   Fax:  [phone]  Physical Therapy Treatment  Patient Details  Name: Todd Ryan MRN: [id number] Date of Birth: 05-25-1961 Referring Provider (PT): Edmonia Lynch, MD   Encounter Date: 09/02/2020   PT End of Session - 09/02/20 0745    Visit Number 8    Number of Visits 27    Date for PT Re-Evaluation 09/26/20    Authorization Type UHC Medicaid    PT Start Time 0745    PT Stop Time 0825    PT Time Calculation (min) 40 min    Activity Tolerance Patient tolerated treatment well    Behavior During Therapy [facility] for tasks assessed/performed           Past Medical History:  Diagnosis Date  . Allergic rhinitis   . Arthritis    shoulder, neck, back  . Chronic back pain   . GAD (generalized anxiety disorder)   . GERD (gastroesophageal reflux disease)   . History of asthma    child  . Hyperlipidemia   . Hypertension   . MDD (major depressive disorder)   . OSA on CPAP    07-15-2020  per pt uses approx. 3 times weekly  . Peripheral neuropathy   . Pre-diabetes   . Rotator cuff tear, right   . Wears glasses     Past Surgical History:  Procedure Laterality Date  . COLONOSCOPY WITH PROPOFOL  05/31/2020  . LUMBAR SPINE SURGERY  11/2014  . SHOULDER ARTHROSCOPY WITH ROTATOR CUFF REPAIR AND SUBACROMIAL DECOMPRESSION Right 07/16/2020   Procedure: SHOULDER ARTHROSCOPY WITH ROTATOR CUFF REPAIR AND SUBACROMIAL DECOMPRESSION;  Surgeon: Renette Butters, MD;  Location: [place];  Service: Orthopedics;  Laterality: Right;    There were no vitals filed for this visit.   Subjective Assessment - 09/02/20 0744    Subjective Pt reports some stiffness and 1/10 pain upon arrival today. He states he had increased pain the night before last that woke him up 2-3 days but reports that he had used the shoulder more than he has been earlier that day. He  confirms he was not lifting any heavy weight but has been moving his shoulder often.    Limitations Lifting;House hold activities;Writing    Patient Stated Goals to reduce pain, use shoulder    Currently in Pain? Yes    Pain Score 1     Pain Location Shoulder    Pain Orientation Right    Pain Descriptors / Indicators Aching    Pain Type Acute pain;Surgical pain    Pain Onset 1 to 4 weeks ago              [facility] PT Assessment - 09/02/20 0001      Assessment   Medical Diagnosis R rotator cuff repair    Referring Provider (PT) Edmonia Lynch, MD    Onset Date/Surgical Date 07/16/20                         [facility] Adult PT Treatment/Exercise - 09/02/20 0001      Shoulder Exercises: Supine   Protraction Limitations Serratus punch RUE x 10    Flexion Right;5 reps      Shoulder Exercises: Standing   Protraction Limitations Serratus punch against wall x 20    Other Standing Exercises standing AAROM dowel shoulder EXT, IR,ER,  flexion and scaption x 20 each  Shoulder Exercises: ROM/Strengthening   UBE (Upper Arm Bike) L1 x 6 min (3 min forward, 3 min backward)    Other ROM/Strengthening Exercises Wall wash FL and ABD x 20 each with 3-5 sec hold at end range FL within tolerance      Manual Therapy   Manual therapy comments STM along R triceps    Joint Mobilization Grades I-III inferior GHJ mobilization    Passive ROM R shoulder passive FL, ABD, ER, and IR within pain free ranges                  PT Education - 09/02/20 0829    Education Details Reviewed HEP and use of modalities for pain reduction as needed    Schoenfeld(s) Educated Patient    Methods Explanation;Demonstration    Comprehension Verbalized understanding;Returned demonstration            PT Short Term Goals - 08/28/20 0901      PT SHORT TERM GOAL #1   Title Pt will be independent and compliant with initial HEP.    Baseline Pt independent with current HEP    Time 2    Period Weeks     Status Achieved    Target Date 08/15/20      PT SHORT TERM GOAL #2   Title Pt will restore full PROM and initiate AAROM by 6 weeks.    Baseline cleared for AROM by MD    Time 4    Period Weeks    Status Achieved             PT Long Term Goals - 08/01/20 1801      PT LONG TERM GOAL #1   Title Pt will initiate AROM and gentle isometric strengthening    Baseline PROM initiated at eval    Time 6    Period Weeks    Status New    Target Date 09/26/20      PT LONG TERM GOAL #2   Title Pt will demonstrate R shoulder ROM WFL without pain as needed for ADLs including bathing and dressing.    Time 8    Period Weeks    Status New    Target Date 09/26/20      PT LONG TERM GOAL #3   Title Pt will demonstrate 4+/5 R shoulder MMT without pain to perform leisure activities.    Baseline NT    Time 8    Period Weeks    Status New    Target Date 09/26/20      PT LONG TERM GOAL #4   Title Pt will be able to lift/lower >/= 15# from waist to [facility] and floor to waist for functional mobility.    Time 8    Period Weeks    Status New    Target Date 09/26/20                 Plan - 09/02/20 0752    Clinical Impression Statement Patient did well with A/AROM and manual therapy with addition of serratus punches this session. He expressed minimal pain and tightness with exercises. He will continue to benefit from skilled PT to further progress R shoulder mobility and strength per protocol.    PT Treatment/Interventions Spinal Manipulations;Joint Manipulations;Vasopneumatic Device;Taping;Dry needling;Passive range of motion;Manual techniques;Patient/family education;Therapeutic exercise;Neuromuscular re-education;ADLs/Self Care Home Management;Cryotherapy;Electrical Stimulation;Iontophoresis 4mg /ml Dexamethasone;Moist Heat;Therapeutic activities    PT Next Visit Plan Assess response to HEP/update PRN, new MD order on 08/19/20- active motion  PT Home Exercise Plan [id number]: pendulums, PROM  flexion on counter/table, wrist flexion/ext stretch, wrist and digit AROM, scapular and cervical retractions, forearm pronation/supination and elbow FL/EXT PROM with UE supported on surface, putty squeezes, 08/21/20- added supine chest press, pullovers, ER AAROM , Seated ER PROM with table    Consulted and Agree with Plan of Care Patient           Patient will benefit from skilled therapeutic intervention in order to improve the following deficits and impairments:  Pain,Postural dysfunction,Impaired UE functional use,Hypomobility,Decreased strength,Impaired flexibility,Increased fascial restricitons,Impaired perceived functional ability,Increased edema,Decreased range of motion  Visit Diagnosis: S/P right rotator cuff repair  Biceps tendonosis of right shoulder  Chronic right shoulder pain     Problem List Patient Active Problem List   Diagnosis Date Noted  . TIA (transient ischemic attack) 06/02/2018  . Hypertension 06/02/2018  . Lower back injury 12/07/2013      Haydee Monica, PT, DPT 09/02/20 8:30 AM  [facility] [address] [place], [place], [address] Phone: [phone]   Fax:  [phone]  Name: QUINLIN CONANT MRN: [id number] Date of Birth: [birth year]

## 2020-09-04 ENCOUNTER — Ambulatory Visit: Payer: Medicaid Other | Admitting: Physical Therapy

## 2020-09-09 ENCOUNTER — Other Ambulatory Visit: Payer: Self-pay

## 2020-09-09 ENCOUNTER — Ambulatory Visit: Payer: Medicaid Other

## 2020-09-09 DIAGNOSIS — Z9889 Other specified postprocedural states: Secondary | ICD-10-CM | POA: Diagnosis not present

## 2020-09-09 DIAGNOSIS — G8929 Other chronic pain: Secondary | ICD-10-CM

## 2020-09-09 DIAGNOSIS — M67813 Other specified disorders of tendon, right shoulder: Secondary | ICD-10-CM

## 2020-09-09 NOTE — Therapy (Signed)
[place], [place], [address] Phone: [phone]   Fax:  [phone]  Physical Therapy Treatment  Patient Details  Name: Todd Ryan MRN: [id number] Date of Birth: January 10, 1961 Referring Provider (PT): Edmonia Lynch, MD   Encounter Date: 09/09/2020   PT End of Session - 09/09/20 0746    Visit Number 9    Number of Visits 27    Date for PT Re-Evaluation 09/26/20    Authorization Type UHC Medicaid    PT Start Time [phone]    PT Stop Time 0830    PT Time Calculation (min) 44 min    Activity Tolerance Patient tolerated treatment well    Behavior During Therapy [facility] for tasks assessed/performed           Past Medical History:  Diagnosis Date  . Allergic rhinitis   . Arthritis    shoulder, neck, back  . Chronic back pain   . GAD (generalized anxiety disorder)   . GERD (gastroesophageal reflux disease)   . History of asthma    child  . Hyperlipidemia   . Hypertension   . MDD (major depressive disorder)   . OSA on CPAP    07-15-2020  per pt uses approx. 3 times weekly  . Peripheral neuropathy   . Pre-diabetes   . Rotator cuff tear, right   . Wears glasses     Past Surgical History:  Procedure Laterality Date  . COLONOSCOPY WITH PROPOFOL  05/31/2020  . LUMBAR SPINE SURGERY  11/2014  . SHOULDER ARTHROSCOPY WITH ROTATOR CUFF REPAIR AND SUBACROMIAL DECOMPRESSION Right 07/16/2020   Procedure: SHOULDER ARTHROSCOPY WITH ROTATOR CUFF REPAIR AND SUBACROMIAL DECOMPRESSION;  Surgeon: Renette Butters, MD;  Location: [place];  Service: Orthopedics;  Laterality: Right;    There were no vitals filed for this visit.   Subjective Assessment - 09/09/20 0746    Subjective "I've been waking up with a little pain. I think I've been rolling over onto it during the night sometimes. I do lay on my left side and put a pillow in front of me, which helps a lot."    Limitations Lifting;House hold  activities;Writing    Patient Stated Goals to reduce pain, use shoulder    Pain Score 2     Pain Location Shoulder    Pain Orientation Right    Pain Descriptors / Indicators Aching    Pain Type Acute pain;Surgical pain    Pain Onset 1 to 4 weeks ago              [facility] PT Assessment - 09/09/20 0001      Assessment   Medical Diagnosis R rotator cuff repair    Referring Provider (PT) Edmonia Lynch, MD    Onset Date/Surgical Date 07/16/20                         [facility] Adult PT Treatment/Exercise - 09/09/20 0001      Self-Care   Self-Care Other Self-Care Comments    Other Self-Care Comments  See patient education      Shoulder Exercises: Standing   Flexion AROM;Right;10 reps;Other (comment)   2 x 10   Extension Strengthening;Both;10 reps;Theraband   2 x 10   Theraband Level (Shoulder Extension) Level 2 (Red)    Row Strengthening;Both;10 reps;Theraband   2 x 10   Theraband Level (Shoulder Row) Level 2 (Red)      Shoulder Exercises: ROM/Strengthening  UBE (Upper Arm Bike) L1 x 6 min (3 min forward, 3 min backward)    Other ROM/Strengthening Exercises Wall wash FL and ABD x 20 each      Shoulder Exercises: Isometric Strengthening   Other Isometric Exercises isometric IR, ER, FL, EXT x 15 each with towel against doorframe      Shoulder Exercises: Stretch   Other Shoulder Stretches bilateral upper trap stretch x 1 min each      Manual Therapy   Manual Therapy Passive ROM    Passive ROM R shoulder passive FL, ABD, ER, and IR within pain free ranges                  PT Education - 09/09/20 0916    Education Details Updated and reviewed HEP, discussed pillow placement and positioning for pt to be able to sleep more comfortably with reduced R shoulder pain    Dykema(s) Educated Patient    Methods Explanation;Demonstration;Verbal cues;Handout    Comprehension Verbalized understanding;Returned demonstration            PT Short Term Goals -  08/28/20 0901      PT SHORT TERM GOAL #1   Title Pt will be independent and compliant with initial HEP.    Baseline Pt independent with current HEP    Time 2    Period Weeks    Status Achieved    Target Date 08/15/20      PT SHORT TERM GOAL #2   Title Pt will restore full PROM and initiate AAROM by 6 weeks.    Baseline cleared for AROM by MD    Time 4    Period Weeks    Status Achieved             PT Long Term Goals - 08/01/20 1801      PT LONG TERM GOAL #1   Title Pt will initiate AROM and gentle isometric strengthening    Baseline PROM initiated at eval    Time 6    Period Weeks    Status New    Target Date 09/26/20      PT LONG TERM GOAL #2   Title Pt will demonstrate R shoulder ROM WFL without pain as needed for ADLs including bathing and dressing.    Time 8    Period Weeks    Status New    Target Date 09/26/20      PT LONG TERM GOAL #3   Title Pt will demonstrate 4+/5 R shoulder MMT without pain to perform leisure activities.    Baseline NT    Time 8    Period Weeks    Status New    Target Date 09/26/20      PT LONG TERM GOAL #4   Title Pt will be able to lift/lower >/= 15# from waist to [facility] and floor to waist for functional mobility.    Time 8    Period Weeks    Status New    Target Date 09/26/20                 Plan - 09/09/20 2947    Clinical Impression Statement Patient tolerated session well with no adverse effects or complaints of significant pain though he did note minimal pain and fatigue during R shoulder A/AROM interventions. Initiated R shoulder IR, ER, FL, and EXT isometrics against wall as well as shoulder extesnion and rows against red theraband with good tolerance. He should continue to benefit from  skilled PT to progress R shoulder mobility and strength per protocol.    Personal Factors and Comorbidities Comorbidity 3+;Past/Current Experience;Time since onset of injury/illness/exacerbation    Comorbidities HTN, HLD, Arthritis     Examination-Activity Limitations Bed Mobility;Bathing;Reach Overhead;Carry;Lift;Dressing    Examination-Participation Restrictions Driving;Cleaning    PT Treatment/Interventions Spinal Manipulations;Joint Manipulations;Vasopneumatic Device;Taping;Dry needling;Passive range of motion;Manual techniques;Patient/family education;Therapeutic exercise;Neuromuscular re-education;ADLs/Self Care Home Management;Cryotherapy;Electrical Stimulation;Iontophoresis 4mg /ml Dexamethasone;Moist Heat;Therapeutic activities    PT Next Visit Plan Assess response to HEP/update PRN, new MD order on 08/19/20- active motion    PT Home Exercise Plan [email or web address]    Consulted and Agree with Plan of Care Patient           Patient will benefit from skilled therapeutic intervention in order to improve the following deficits and impairments:  Pain,Postural dysfunction,Impaired UE functional use,Hypomobility,Decreased strength,Impaired flexibility,Increased fascial restricitons,Impaired perceived functional ability,Increased edema,Decreased range of motion  Visit Diagnosis: S/P right rotator cuff repair  Biceps tendonosis of right shoulder  Chronic right shoulder pain     Problem List Patient Active Problem List   Diagnosis Date Noted  . TIA (transient ischemic attack) 06/02/2018  . Hypertension 06/02/2018  . Lower back injury 12/07/2013     Haydee Monica, PT, DPT 09/09/20 9:22 AM  [facility] [address] [place], [place], [address] Phone: [phone]   Fax:  [phone]  Name: Todd Ryan MRN: [id number] Date of Birth: 1961-05-06

## 2020-09-11 ENCOUNTER — Ambulatory Visit: Payer: Medicaid Other | Admitting: Physical Therapy

## 2020-09-11 ENCOUNTER — Other Ambulatory Visit: Payer: Self-pay

## 2020-09-11 ENCOUNTER — Encounter: Payer: Self-pay | Admitting: Physical Therapy

## 2020-09-11 DIAGNOSIS — Z9889 Other specified postprocedural states: Secondary | ICD-10-CM | POA: Diagnosis not present

## 2020-09-11 DIAGNOSIS — G8929 Other chronic pain: Secondary | ICD-10-CM

## 2020-09-11 DIAGNOSIS — M67813 Other specified disorders of tendon, right shoulder: Secondary | ICD-10-CM

## 2020-09-11 NOTE — Therapy (Signed)
[place], [place], [address] Phone: [phone]   Fax:  [phone]  Physical Therapy Treatment  Patient Details  Name: Todd Ryan MRN: [id number] Date of Birth: 1961/04/03 Referring Provider (PT): Edmonia Lynch, MD   Encounter Date: 09/11/2020   PT End of Session - 09/11/20 0846    Visit Number 10    Number of Visits 27    Date for PT Re-Evaluation 09/26/20    Authorization Type UHC Medicaid, 27 visit limit    Authorization - Visit Number 10    Authorization - Number of Visits 27    PT Start Time 0800    PT Stop Time 0177    PT Time Calculation (min) 44 min           Past Medical History:  Diagnosis Date  . Allergic rhinitis   . Arthritis    shoulder, neck, back  . Chronic back pain   . GAD (generalized anxiety disorder)   . GERD (gastroesophageal reflux disease)   . History of asthma    child  . Hyperlipidemia   . Hypertension   . MDD (major depressive disorder)   . OSA on CPAP    07-15-2020  per pt uses approx. 3 times weekly  . Peripheral neuropathy   . Pre-diabetes   . Rotator cuff tear, right   . Wears glasses     Past Surgical History:  Procedure Laterality Date  . COLONOSCOPY WITH PROPOFOL  05/31/2020  . LUMBAR SPINE SURGERY  11/2014  . SHOULDER ARTHROSCOPY WITH ROTATOR CUFF REPAIR AND SUBACROMIAL DECOMPRESSION Right 07/16/2020   Procedure: SHOULDER ARTHROSCOPY WITH ROTATOR CUFF REPAIR AND SUBACROMIAL DECOMPRESSION;  Surgeon: Renette Butters, MD;  Location: [place];  Service: Orthopedics;  Laterality: Right;    There were no vitals filed for this visit.   Subjective Assessment - 09/11/20 0845    Subjective Pt reports waking up lsat night with intense pain in right posterior shoulder into posterior upper arm. The pain resolved within 2 minutes. He arrives to treatment with 0/10 pain.    Currently in Pain? No/denies              [facility] PT  Assessment - 09/11/20 0001      AROM   Right Shoulder Flexion 142 Degrees    Right Shoulder ABduction 128 Degrees    Right Shoulder Internal Rotation --   Reach to L1   Right Shoulder External Rotation --   Reach to T2                        [facility] Adult PT Treatment/Exercise - 09/11/20 0001      Shoulder Exercises: Supine   Protraction Limitations Serratus punch RUE x 10      Shoulder Exercises: Prone   Retraction 10 reps   with extension to neutral   Retraction Limitations Prone row x 10    Other Prone Exercises --      Shoulder Exercises: Sidelying   External Rotation 15 reps    External Rotation Limitations towel  under elbow      Shoulder Exercises: Standing   Extension Strengthening;Both;10 reps;Theraband   2 x 10   Theraband Level (Shoulder Extension) Level 2 (Red)    Row Strengthening;Both;10 reps;Theraband   2 x 10   Theraband Level (Shoulder Row) Level 2 (Red)    Other Standing Exercises AAROM shoulder extension and IR with dowel  Shoulder Exercises: Pulleys   Flexion 1 minute    Scaption 1 minute      Shoulder Exercises: ROM/Strengthening   UBE (Upper Arm Bike) L2 x 6 min (3 min forward, 3 min backward)    Other ROM/Strengthening Exercises Wall wash FL and ABD x 20 each      Manual Therapy   Manual Therapy Passive ROM    Passive ROM R shoulder passive FL, ABD, ER, and IR within pain free ranges                    PT Short Term Goals - 08/28/20 0901      PT SHORT TERM GOAL #1   Title Pt will be independent and compliant with initial HEP.    Baseline Pt independent with current HEP    Time 2    Period Weeks    Status Achieved    Target Date 08/15/20      PT SHORT TERM GOAL #2   Title Pt will restore full PROM and initiate AAROM by 6 weeks.    Baseline cleared for AROM by MD    Time 4    Period Weeks    Status Achieved             PT Long Term Goals - 08/01/20 1801      PT LONG TERM GOAL #1   Title Pt will  initiate AROM and gentle isometric strengthening    Baseline PROM initiated at eval    Time 6    Period Weeks    Status New    Target Date 09/26/20      PT LONG TERM GOAL #2   Title Pt will demonstrate R shoulder ROM WFL without pain as needed for ADLs including bathing and dressing.    Time 8    Period Weeks    Status New    Target Date 09/26/20      PT LONG TERM GOAL #3   Title Pt will demonstrate 4+/5 R shoulder MMT without pain to perform leisure activities.    Baseline NT    Time 8    Period Weeks    Status New    Target Date 09/26/20      PT LONG TERM GOAL #4   Title Pt will be able to lift/lower >/= 15# from waist to [facility] and floor to waist for functional mobility.    Time 8    Period Weeks    Status New    Target Date 09/26/20                 Plan - 09/11/20 0847    Clinical Impression Statement Pt reports he forgot to do his new exercises with the therabands. Reviewed therabands and continued with AAROM and AROM exercises. His AROM has improved. He had min pain with AAROM abduction using wall. He reported arm fatigue afterward. He declined modalities and will ice at home.    PT Next Visit Plan Assess response to HEP/update PRN, new MD order on 08/19/20- active motion    PT Home Exercise Plan [id number]           Patient will benefit from skilled therapeutic intervention in order to improve the following deficits and impairments:  Pain,Postural dysfunction,Impaired UE functional use,Hypomobility,Decreased strength,Impaired flexibility,Increased fascial restricitons,Impaired perceived functional ability,Increased edema,Decreased range of motion  Visit Diagnosis: S/P right rotator cuff repair  Biceps tendonosis of right shoulder  Chronic right shoulder pain  Problem List Patient Active Problem List   Diagnosis Date Noted  . TIA (transient ischemic attack) 06/02/2018  . Hypertension 06/02/2018  . Lower back injury 12/07/2013    Dorene Ar , PTA 09/11/2020, 8:50 AM  [facility] [address] [place], [place], [address] Phone: [phone]   Fax:  [phone]  Name: Todd Ryan MRN: [id number] Date of Birth: [birth year]

## 2020-09-23 ENCOUNTER — Other Ambulatory Visit: Payer: Self-pay

## 2020-09-23 ENCOUNTER — Ambulatory Visit: Payer: Medicaid Other

## 2020-09-23 DIAGNOSIS — M67813 Other specified disorders of tendon, right shoulder: Secondary | ICD-10-CM

## 2020-09-23 DIAGNOSIS — Z9889 Other specified postprocedural states: Secondary | ICD-10-CM | POA: Diagnosis not present

## 2020-09-23 DIAGNOSIS — G8929 Other chronic pain: Secondary | ICD-10-CM
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
## 2020-09-26 ENCOUNTER — Encounter: Payer: Self-pay | Admitting: Physical Therapy

## 2020-09-26 ENCOUNTER — Other Ambulatory Visit: Payer: Self-pay

## 2020-09-26 ENCOUNTER — Ambulatory Visit: Payer: Medicaid Other | Admitting: Physical Therapy

## 2020-09-26 DIAGNOSIS — Z9889 Other specified postprocedural states: Secondary | ICD-10-CM

## 2020-09-26 DIAGNOSIS — M67813 Other specified disorders of tendon, right shoulder: Secondary | ICD-10-CM

## 2020-09-26 DIAGNOSIS — G8929 Other chronic pain: Secondary | ICD-10-CM
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
## 2020-09-30 ENCOUNTER — Ambulatory Visit: Payer: Medicaid Other

## 2020-10-03 ENCOUNTER — Encounter: Payer: Medicaid Other | Admitting: Physical Therapy

## 2020-10-08 ENCOUNTER — Ambulatory Visit: Payer: Medicaid Other | Attending: Orthopedic Surgery

## 2020-10-08 ENCOUNTER — Telehealth: Payer: Self-pay

## 2020-10-08 NOTE — Telephone Encounter (Signed)
Called pt and LM VM regarding missed re-evaluation appointment today at 11:00 am. Advised pt we can reschedule for after the 15th when gets his new pain medication prescription, just to call us back to let us know.   Phill Myron. Yvette Rack, PT, DPT

## 2020-10-10 ENCOUNTER — Telehealth: Payer: Self-pay | Admitting: Physical Therapy

## 2020-10-10 ENCOUNTER — Ambulatory Visit: Payer: Medicaid Other | Admitting: Physical Therapy

## 2020-10-10 NOTE — Telephone Encounter (Signed)
Left voicemail for patient regarding his second no- show this week. Left reminder of attendance policy and that he will need to call to schedule if he would like future appointments. His next appointment is canceled per the attendance policy.

## 2020-10-23 ENCOUNTER — Encounter: Payer: Medicaid Other | Admitting: Physical Therapy

## 2020-11-21 DIAGNOSIS — M542 Cervicalgia: Secondary | ICD-10-CM | POA: Insufficient documentation

## 2021-02-19 DIAGNOSIS — M25511 Pain in right shoulder: Secondary | ICD-10-CM | POA: Insufficient documentation

## 2021-02-19 DIAGNOSIS — Z6832 Body mass index (BMI) 32.0-32.9, adult: Secondary | ICD-10-CM | POA: Insufficient documentation

## 2021-03-24 ENCOUNTER — Encounter: Payer: Self-pay | Admitting: Physical Therapy

## 2021-03-24 ENCOUNTER — Other Ambulatory Visit: Payer: Self-pay

## 2021-03-24 ENCOUNTER — Ambulatory Visit: Payer: Medicaid Other | Attending: Orthopedic Surgery | Admitting: Physical Therapy

## 2021-03-24 DIAGNOSIS — M6281 Muscle weakness (generalized): Secondary | ICD-10-CM | POA: Diagnosis present

## 2021-03-24 DIAGNOSIS — M25511 Pain in right shoulder: Secondary | ICD-10-CM | POA: Insufficient documentation

## 2021-03-24 DIAGNOSIS — G8929 Other chronic pain: Secondary | ICD-10-CM | POA: Diagnosis present

## 2021-03-24 DIAGNOSIS — M542 Cervicalgia: Secondary | ICD-10-CM | POA: Insufficient documentation

## 2021-03-24 NOTE — Patient Instructions (Signed)
Access Code: [email or web address] URL: [email or web address] Date: 03/24/2021 Prepared by: Hilda Blades  Exercises Seated Levator Scapulae Stretch - 1 x daily - 7 x weekly - 2 reps - 30 hold Shoulder External Rotation and Scapular Retraction with Resistance - 1 x daily - 7 x weekly - 3 sets - 20 reps Seated Neck Retraction - 1 x daily - 7 x weekly - 3 sets - 10 reps - 3 hold

## 2021-03-24 NOTE — Therapy (Signed)
[place], [place], [address] Phone: [phone]   Fax:  [phone]  Physical Therapy Evaluation  Patient Details  Name: Todd Ryan MRN: [id number] Date of Birth: 09-20-1960 Referring Provider (PT): Renette Butters, MD   Encounter Date: 03/24/2021   PT End of Session - 03/24/21 1138     Visit Number 1    Number of Visits 12    Date for PT Re-Evaluation 05/05/21    Authorization Type [employer] MCD    Authorization - Number of Visits 27    PT Start Time 1045    PT Stop Time 1130    PT Time Calculation (min) 45 min    Activity Tolerance Patient tolerated treatment well    Behavior During Therapy [facility] for tasks assessed/performed             Past Medical History:  Diagnosis Date   Allergic rhinitis    Arthritis    shoulder, neck, back   Chronic back pain    GAD (generalized anxiety disorder)    GERD (gastroesophageal reflux disease)    History of asthma    child   Hyperlipidemia    Hypertension    MDD (major depressive disorder)    OSA on CPAP    07-15-2020  per pt uses approx. 3 times weekly   Peripheral neuropathy    Pre-diabetes    Rotator cuff tear, right    Wears glasses     Past Surgical History:  Procedure Laterality Date   COLONOSCOPY WITH PROPOFOL  05/31/2020   LUMBAR SPINE SURGERY  11/2014   SHOULDER ARTHROSCOPY WITH ROTATOR CUFF REPAIR AND SUBACROMIAL DECOMPRESSION Right 07/16/2020   Procedure: SHOULDER ARTHROSCOPY WITH ROTATOR CUFF REPAIR AND SUBACROMIAL DECOMPRESSION;  Surgeon: Renette Butters, MD;  Location: [place];  Service: Orthopedics;  Laterality: Right;    There were no vitals filed for this visit.    Subjective Assessment - 03/24/21 1050     Subjective Patient reports neck right sided neck and shoulder pain. He is unable to put any pressure down on the right arm and if he does when getting out of bed the pain will shoot up past 10/10 and  last a few minutes. States it has been worsening more recently. Pain runs on right side of neck from behind ear to the right shoulder. He reports no mechanism of injury for neck pain. He had shoulder rotator cuff surgery in January and that is still giving him some trouble with pain while lifting overhead.    Limitations House hold activities;Lifting    Patient Stated Goals Get neck pain relief and improve lifting ability    Currently in Pain? Yes    Pain Score 2     Pain Location Neck    Pain Orientation Right    Pain Descriptors / Indicators Aching    Pain Type Chronic pain    Pain Radiating Towards right side neck to to right shoulder, sometimes down the right arm    Pain Onset More than a month ago    Pain Frequency Intermittent    Aggravating Factors  Pushing off right side getting out of bed, sleeping on right side,    Pain Relieving Factors Rest, avoid aggravating activities    Multiple Pain Sites Yes    Pain Score 4    Pain Location Shoulder    Pain Orientation Right    Pain Descriptors / Indicators Aching;Sharp    Pain Type  Chronic pain    Pain Onset More than a month ago    Pain Frequency Intermittent    Aggravating Factors  Walking, raising arm overhead, lifting overhead, basketball    Pain Relieving Factors Rest                [facility] PT Assessment - 03/24/21 0001       Assessment   Medical Diagnosis Neck    Referring Provider (PT) Renette Butters, MD    Onset Date/Surgical Date --   worsening over past month   Hand Dominance Right    Next MD Visit Not scheduled    Prior Therapy Yes - right rotator cuff repair      Precautions   Precautions None      Restrictions   Weight Bearing Restrictions No      Balance Screen   Has the patient fallen in the past 6 months No    Has the patient had a decrease in activity level because of a fear of falling?  No    Is the patient reluctant to leave their home because of a fear of falling?  No      Prior Function    Level of Independence Independent    Vocation Part time employment    Vocation Requirements Not required to do any lifting    Leisure Basketball, hunting      Cognition   Overall Cognitive Status Within Functional Limits for tasks assessed      Observation/Other Assessments   Observations Patient appears in no apparent distress    Focus on Therapeutic Outcomes (FOTO)  NA - MCD      Sensation   Light Touch Appears Intact      Coordination   Gross Motor Movements are Fluid and Coordinated Yes      Posture/Postural Control   Posture Comments Rounded shoulder, forward head      ROM / Strength   AROM / PROM / Strength AROM;Strength      AROM   AROM Assessment Site Cervical;Shoulder    Right/Left Shoulder Right;Left    Right Shoulder Flexion 150 Degrees    Right Shoulder ABduction 155 Degrees    Right Shoulder Internal Rotation --   reach to L1   Right Shoulder External Rotation 60 Degrees    Left Shoulder Flexion 165 Degrees    Left Shoulder ABduction 170 Degrees    Left Shoulder Internal Rotation --   reach to T8   Left Shoulder External Rotation 60 Degrees    Cervical Flexion 45    Cervical Extension 40    Cervical - Right Side Bend 20    Cervical - Left Side Bend 15    Cervical - Right Rotation 60    Cervical - Left Rotation 60      Strength   Overall Strength Comments Periscapular strength grossly 4-/5 MMT bilaterally    Strength Assessment Site Shoulder    Right/Left Shoulder Right;Left    Right Shoulder Flexion 4/5    Right Shoulder ABduction 4+/5    Right Shoulder Internal Rotation 5/5    Right Shoulder External Rotation 4/5    Left Shoulder Flexion 5/5    Left Shoulder ABduction 5/5    Left Shoulder Internal Rotation 5/5    Left Shoulder External Rotation 5/5      Palpation   Palpation comment TTP right upper trap and levator region with palpable trigger points      Special Tests   Other special  tests DNF endurance test: 18 seconds                         Objective measurements completed on examination: See above findings.       [facility] Adult PT Treatment/Exercise - 03/24/21 0001       Exercises   Exercises Neck      Neck Exercises: Theraband   Shoulder External Rotation 20 reps;Red    Shoulder External Rotation Limitations doubler er + scap retraction      Neck Exercises: Seated   Neck Retraction 10 reps;3 secs      Neck Exercises: Stretches   Levator Stretch 3 reps;30 seconds                     PT Education - 03/24/21 1132     Education Details Exam findings, POC, HEP    Blecher(s) Educated Patient    Methods Explanation;Demonstration;Verbal cues    Comprehension Verbalized understanding;Returned demonstration;Verbal cues required;Need further instruction              PT Short Term Goals - 03/24/21 1150       PT SHORT TERM GOAL #1   Title Pt will be independent and compliant with initial HEP to progress with therapy    Baseline HEP provided at eval    Time 3    Period Weeks    Status New    Target Date 04/14/21      PT SHORT TERM GOAL #2   Title Patient will report </= 5/10 right sided neck pain with activity to reduce functional limitation    Baseline patient reports pain can be 10/10 with activity    Time 3    Period Weeks    Status New    Target Date 04/14/21               PT Long Term Goals - 03/24/21 1152       PT LONG TERM GOAL #1   Title Patient will be I with final HEP to maintain progress from PT    Baseline HEP provided at eval    Time 6    Period Weeks    Status New    Target Date 05/05/21      PT LONG TERM GOAL #2   Title Patient will report no increase in neck pain or tightness with cervical motion to improve household tasks    Baseline patient reports right sided neck tightness with cervical motion    Time 6    Period Weeks    Status New    Target Date 05/05/21      PT LONG TERM GOAL #3   Title Patient will demonstrate >/= 4+/5 MMT of  right shoulder to improve lifting overhead without pain    Baseline grossly 4/5 MMT    Time 6    Period Weeks    Status New    Target Date 05/05/21      PT LONG TERM GOAL #4   Title Patient will demonstrate right shoulder AROM grssly WFL and equal to opposite side to impove dressing ability    Baseline patient limited with shoulder elevation and reach behind back    Time 6    Period Weeks    Status New    Target Date 05/05/21      PT LONG TERM GOAL #5   Title Patient will demonstrate DNF endurance >/= 30 sec to improve postural control  and reduce pain with activity    Baseline 18 seconds    Time 6    Period Weeks    Status New    Target Date 05/05/21                    Plan - 03/24/21 1140     Clinical Impression Statement Patient presents to PT with report of worsening right sided neck and shoulder pain. He has history of right rotator cuff repair in January 2022 and onset of neck pain within the past few months. He currently exhibits rounded shoulder and forward head posture with decreased DNF endurance, increased right sided neck tightness with active cervical motion, decreased right shoulder AROM with shrug compensation with elevation, strength deficit of the right rotator cuff and periscapular musculature, tenderness and palpable trigger points of right upper trap and levator scap. His pain seems to be muscular in nature due to poor posture and shoulder weakness resulting in impairment movement mechanics and upper trap overuse. He was provided exercises to initiate stretching and strength to reduce muscular tightness and improve posture control, and he would benefit from continued skilled PT to progress mobility and strength in order to reduce pain and maximize functional ability.    Personal Factors and Comorbidities Fitness;Past/Current Experience;Time since onset of injury/illness/exacerbation    Examination-Activity Limitations Reach Overhead;Sleep;Lift;Dressing;Carry     Examination-Participation Restrictions Occupation;Community Activity;Yard Work;Cleaning    Stability/Clinical Decision Making Stable/Uncomplicated    Clinical Decision Making Low    Rehab Potential Good    PT Frequency 2x / week    PT Duration 6 weeks    PT Treatment/Interventions ADLs/Self Care Home Management;Aquatic Therapy;Cryotherapy;Electrical Stimulation;Iontophoresis 4mg /ml Dexamethasone;Moist Heat;Traction;Ultrasound;Neuromuscular re-education;Balance training;Therapeutic exercise;Therapeutic activities;Functional mobility training;Stair training;Gait training;Patient/family education;Manual techniques;Dry needling;Passive range of motion;Taping;Vasopneumatic Device;Spinal Manipulations;Joint Manipulations    PT Next Visit Plan Review HEP and progress PRN, manual/dry needling for right upper trap and levator region, progress postural strength and endurance, right shoulder strengthening    PT Home Exercise Plan [email or web address]    Consulted and Agree with Plan of Care Patient             Patient will benefit from skilled therapeutic intervention in order to improve the following deficits and impairments:  Decreased range of motion, Postural dysfunction, Decreased strength, Improper body mechanics, Decreased activity tolerance, Pain  Visit Diagnosis: Cervicalgia  Chronic right shoulder pain  Muscle weakness (generalized)     Problem List Patient Active Problem List   Diagnosis Date Noted   TIA (transient ischemic attack) 06/02/2018   Hypertension 06/02/2018   Lower back injury 12/07/2013    Hilda Blades, PT, DPT, LAT, ATC 03/24/21  12:51 PM Phone: [phone] Fax: [place] [facility] [address] [place], [place], [address] Phone: [phone]   Fax:  [phone]  Name: Todd Ryan MRN: [id number] Date of Birth: 19-Sep-1960

## 2021-03-31 ENCOUNTER — Ambulatory Visit: Payer: Medicaid Other | Attending: Orthopedic Surgery | Admitting: Physical Therapy

## 2021-03-31 ENCOUNTER — Encounter: Payer: Self-pay | Admitting: Physical Therapy

## 2021-03-31 ENCOUNTER — Other Ambulatory Visit: Payer: Self-pay

## 2021-03-31 DIAGNOSIS — M25511 Pain in right shoulder: Secondary | ICD-10-CM | POA: Insufficient documentation

## 2021-03-31 DIAGNOSIS — M542 Cervicalgia: Secondary | ICD-10-CM | POA: Insufficient documentation

## 2021-03-31 DIAGNOSIS — G8929 Other chronic pain: Secondary | ICD-10-CM | POA: Insufficient documentation

## 2021-03-31 DIAGNOSIS — M67813 Other specified disorders of tendon, right shoulder: Secondary | ICD-10-CM | POA: Diagnosis present

## 2021-03-31 DIAGNOSIS — Z9889 Other specified postprocedural states: Secondary | ICD-10-CM | POA: Insufficient documentation

## 2021-03-31 DIAGNOSIS — M6281 Muscle weakness (generalized): Secondary | ICD-10-CM | POA: Diagnosis present

## 2021-03-31 NOTE — Therapy (Signed)
[place], [place], [address] Phone: [phone]   Fax:  [phone]  Physical Therapy Treatment  Patient Details  Name: Todd Ryan MRN: [id number] Date of Birth: Jul 07, 1960 Referring Provider (PT): Renette Butters, MD   Encounter Date: 03/31/2021   PT End of Session - 03/31/21 0908     Visit Number 2    Number of Visits 12    Date for PT Re-Evaluation 05/05/21    Authorization Type UHC MCD    Authorization - Number of Visits 27    PT Start Time 0912    PT Stop Time 0955    PT Time Calculation (min) 43 min    Activity Tolerance Patient tolerated treatment well    Behavior During Therapy [facility] for tasks assessed/performed             Past Medical History:  Diagnosis Date   Allergic rhinitis    Arthritis    shoulder, neck, back   Chronic back pain    GAD (generalized anxiety disorder)    GERD (gastroesophageal reflux disease)    History of asthma    child   Hyperlipidemia    Hypertension    MDD (major depressive disorder)    OSA on CPAP    07-15-2020  per pt uses approx. 3 times weekly   Peripheral neuropathy    Pre-diabetes    Rotator cuff tear, right    Wears glasses     Past Surgical History:  Procedure Laterality Date   COLONOSCOPY WITH PROPOFOL  05/31/2020   LUMBAR SPINE SURGERY  11/2014   SHOULDER ARTHROSCOPY WITH ROTATOR CUFF REPAIR AND SUBACROMIAL DECOMPRESSION Right 07/16/2020   Procedure: SHOULDER ARTHROSCOPY WITH ROTATOR CUFF REPAIR AND SUBACROMIAL DECOMPRESSION;  Surgeon: Renette Butters, MD;  Location: [place];  Service: Orthopedics;  Laterality: Right;    There were no vitals filed for this visit.   Subjective Assessment - 03/31/21 0910     Subjective Patient reports he is feeling pretty good today, and he reports that he has done his exercises since last visit.    Patient Stated Goals Get neck pain relief and improve lifting ability     Currently in Pain? Yes    Pain Score 1     Pain Location Neck    Pain Orientation Right    Pain Descriptors / Indicators Aching    Pain Type Chronic pain    Pain Onset More than a month ago    Pain Frequency Intermittent    Pain Score 1    Pain Location Shoulder    Pain Orientation Right    Pain Descriptors / Indicators Aching    Pain Type Chronic pain    Pain Onset More than a month ago    Pain Frequency Intermittent                [facility] PT Assessment - 03/31/21 0001       AROM   Cervical - Right Side Bend 30    Cervical - Left Side Bend 30                           [facility] Adult PT Treatment/Exercise - 03/31/21 0001       Exercises   Exercises Neck             [facility] Adult PT Treatment/Exercise:  Therapeutic Exercise: - UBE L3 x 4 min (2 fwd/bwd) -  Levator scap stretch 2 x 30 sec - Doorway pec stretch at 60 deg 2 x 30 sec each - Instruction on SMFR using tennis ball at wall x 2 min - Row with green 2 x 15 - Double ER + scap retraction with green 2 x 15 - Seated chin tuck 2 x 10 with 5 sec hold  Manual Therapy: - STM / myofascial release for right upper trap, levator scap, rhomboid, infraspinatus - Suboccipital release with gentle manual traction x 3 bouts  Neuromuscular re-ed: - NA  Therapeutic Activity: - NA         PT Education - 03/31/21 0908     Education Details HEP update, dry needling    [place]lee[place](s) Educated Patient    Methods Explanation;Demonstration;Verbal cues;Handout    Comprehension Verbalized understanding;Returned demonstration;Verbal cues required;Need further instruction              PT Short Term Goals - 03/31/21 0959       PT SHORT TERM GOAL #1   Title Pt will be independent and compliant with initial HEP to progress with therapy    Baseline progressing with HEP    Time 3    Period Weeks    Status On-going    Target Date 04/14/21      PT SHORT TERM GOAL #2   Title Patient will report </=  5/10 right sided neck pain with activity to reduce functional limitation    Baseline patient reports pain can be 10/10 with activity    Time 3    Period Weeks    Status On-going    Target Date 04/14/21               PT Long Term Goals - 03/24/21 1152       PT LONG TERM GOAL #1   Title Patient will be I with final HEP to maintain progress from PT    Baseline HEP provided at eval    Time 6    Period Weeks    Status New    Target Date 05/05/21      PT LONG TERM GOAL #2   Title Patient will report no increase in neck pain or tightness with cervical motion to improve household tasks    Baseline patient reports right sided neck tightness with cervical motion    Time 6    Period Weeks    Status New    Target Date 05/05/21      PT LONG TERM GOAL #3   Title Patient will demonstrate >/= 4+/5 MMT of right shoulder to improve lifting overhead without pain    Baseline grossly 4/5 MMT    Time 6    Period Weeks    Status New    Target Date 05/05/21      PT LONG TERM GOAL #4   Title Patient will demonstrate right shoulder AROM grssly WFL and equal to opposite side to impove dressing ability    Baseline patient limited with shoulder elevation and reach behind back    Time 6    Period Weeks    Status New    Target Date 05/05/21      PT LONG TERM GOAL #5   Title Patient will demonstrate DNF endurance >/= 30 sec to improve postural control and reduce pain with activity    Baseline 18 seconds    Time 6    Period Weeks    Status New    Target Date 05/05/21  Plan - 03/31/21 0908     Clinical Impression Statement Patient tolerated therapy well with no adverse effects. Manual performed this visit for right upper trap/levator/infra/rhomboids with palpable trigger points and patient reported referred pain into arm and cervical region. Improved cervical side bend noted post manual. Continue progression of postural strengthening and progressed HEP to use  green band and add SMFR using tennis ball. Patient would benefit from continued skilled PT to progress mobility and strength in order to reduce pain and maximize functional ability.    PT Treatment/Interventions ADLs/Self Care Home Management;Aquatic Therapy;Cryotherapy;Electrical Stimulation;Iontophoresis 4mg /ml Dexamethasone;Moist Heat;Traction;Ultrasound;Neuromuscular re-education;Balance training;Therapeutic exercise;Therapeutic activities;Functional mobility training;Stair training;Gait training;Patient/family education;Manual techniques;Dry needling;Passive range of motion;Taping;Vasopneumatic Device;Spinal Manipulations;Joint Manipulations    PT Next Visit Plan Review HEP and progress PRN, manual/dry needling for right trap/levator/infra/rhomboid, progress postural strength and endurance, right shoulder strengthening    PT Home Exercise Plan [email or web address]    Consulted and Agree with Plan of Care Patient             Patient will benefit from skilled therapeutic intervention in order to improve the following deficits and impairments:  Decreased range of motion, Postural dysfunction, Decreased strength, Improper body mechanics, Decreased activity tolerance, Pain  Visit Diagnosis: Cervicalgia  Chronic right shoulder pain  Muscle weakness (generalized)     Problem List Patient Active Problem List   Diagnosis Date Noted   TIA (transient ischemic attack) 06/02/2018   Hypertension 06/02/2018   Lower back injury 12/07/2013    Hilda Blades, PT, DPT, LAT, ATC 03/31/21  10:00 AM Phone: [phone] Fax: [facility] [address] [place], [place], [address] Phone: [phone]   Fax:  [phone]  Name: Todd Ryan MRN: [id number] Date of Birth: June 29, 1961

## 2021-03-31 NOTE — Patient Instructions (Signed)
Access Code: [email or web address] URL: [email or web address] Date: 03/31/2021 Prepared by: Hilda Blades  Exercises Seated Levator Scapulae Stretch - 1 x daily - 7 x weekly - 2 reps - 30 hold Shoulder External Rotation and Scapular Retraction with Resistance - 1 x daily - 7 x weekly - 3 sets - 20 reps Seated Neck Retraction - 1 x daily - 7 x weekly - 3 sets - 10 reps - 3 hold Standing Upper Trapezius Mobilization with Small Ball - 1 x daily - 7 x weekly - 2-3 minutes hold

## 2021-04-04 ENCOUNTER — Ambulatory Visit: Payer: Medicaid Other | Admitting: Physical Therapy

## 2021-04-04 ENCOUNTER — Telehealth: Payer: Self-pay | Admitting: Physical Therapy

## 2021-04-04 NOTE — Telephone Encounter (Signed)
Spoke with pt regarding missed appointment today. He stated he completed forgot about his appointment today. Reviewed next scheduled appointment and he confirmed he would make his next appointment.   Antoni Stefan PT, DPT, LAT, ATC  04/04/21  11:46 AM

## 2021-04-07 ENCOUNTER — Other Ambulatory Visit: Payer: Self-pay

## 2021-04-07 ENCOUNTER — Ambulatory Visit: Payer: Medicaid Other | Admitting: Physical Therapy

## 2021-04-07 ENCOUNTER — Encounter: Payer: Self-pay | Admitting: Physical Therapy

## 2021-04-07 DIAGNOSIS — G8929 Other chronic pain: Secondary | ICD-10-CM

## 2021-04-07 DIAGNOSIS — M542 Cervicalgia: Secondary | ICD-10-CM | POA: Diagnosis not present

## 2021-04-07 DIAGNOSIS — M6281 Muscle weakness (generalized): Secondary | ICD-10-CM

## 2021-04-07 NOTE — Therapy (Signed)
[place], [place], [address] Phone: [phone]   Fax:  [phone]  Physical Therapy Treatment  Patient Details  Name: Todd Ryan MRN: [id number] Date of Birth: [birth year] Referring Provider (PT): Renette Butters, MD   Encounter Date: 04/07/2021   PT End of Session - 04/07/21 0918     Visit Number 3    Number of Visits 12    Date for PT Re-Evaluation 05/05/21    Authorization Type UHC MCD    Authorization - Visit Number 2    Authorization - Number of Visits 27    PT Start Time 0912    PT Stop Time 0953    PT Time Calculation (min) 41 min    Activity Tolerance Patient tolerated treatment well    Behavior During Therapy [facility] for tasks assessed/performed             Past Medical History:  Diagnosis Date   Allergic rhinitis    Arthritis    shoulder, neck, back   Chronic back pain    GAD (generalized anxiety disorder)    GERD (gastroesophageal reflux disease)    History of asthma    child   Hyperlipidemia    Hypertension    MDD (major depressive disorder)    OSA on CPAP    07-15-2020  per pt uses approx. 3 times weekly   Peripheral neuropathy    Pre-diabetes    Rotator cuff tear, right    Wears glasses     Past Surgical History:  Procedure Laterality Date   COLONOSCOPY WITH PROPOFOL  05/31/2020   LUMBAR SPINE SURGERY  11/2014   SHOULDER ARTHROSCOPY WITH ROTATOR CUFF REPAIR AND SUBACROMIAL DECOMPRESSION Right 07/16/2020   Procedure: SHOULDER ARTHROSCOPY WITH ROTATOR CUFF REPAIR AND SUBACROMIAL DECOMPRESSION;  Surgeon: Renette Butters, MD;  Location: [place];  Service: Orthopedics;  Laterality: Right;    There were no vitals filed for this visit.   Subjective Assessment - 04/07/21 0914     Subjective Patient reports he is doing well, no new issues. Patient states over the past week the worst the pain has been is a 2/10.    Patient Stated Goals Get neck  pain relief and improve lifting ability    Currently in Pain? Yes    Pain Score 0-No pain    Pain Location Neck    Pain Orientation Right    Pain Descriptors / Indicators Aching    Pain Type Chronic pain    Pain Onset More than a month ago    Pain Frequency Intermittent    Aggravating Factors  Pushing off right side getting out of bed, sleeping on right side    Pain Score 0    Pain Location Shoulder    Pain Orientation Right    Pain Descriptors / Indicators Aching    Pain Type Chronic pain    Pain Onset More than a month ago    Pain Frequency Intermittent    Aggravating Factors  Raising arm overhead, lifting overhead                [facility] PT Assessment - 04/07/21 0001       AROM   Right Shoulder Flexion 160 Degrees                        Therapeutic Exercise: - UBE L3 x 4 min (2 fwd/bwd) - Sidelying thoracic rotation x 10 each -  Doorway pec stretch at 90 deg abd 3 x 30 sec each - Upper trap stretch x 30 sec (right only) - Levator scap stretch with overpressure x 30 sec (right only) - Seated chin tuck 2 x 5 with 5 sec hold - Row machine 45# 3 x 10 - cued to avoid shrug - Banded ER with green 2 x 10 (right only)  Manual Therapy: - STM / myofascial release for right upper trap, levator scap, rhomboid - Suboccipital release with gentle manual traction x 3 bouts   Neuromuscular re-ed: - NA   Therapeutic Activity: - NA            PT Education - 04/07/21 0918     Education Details HEP    Kalan(s) Educated Patient    Methods Explanation;Demonstration;Verbal cues    Comprehension Verbalized understanding;Returned demonstration;Verbal cues required;Need further instruction              PT Short Term Goals - 04/07/21 0936       PT SHORT TERM GOAL #1   Title Pt will be independent and compliant with initial HEP to progress with therapy    Baseline progressing with HEP    Time 3    Period Weeks    Status On-going    Target Date  04/14/21      PT SHORT TERM GOAL #2   Title Patient will report </= 5/10 right sided neck pain with activity to reduce functional limitation    Baseline patient reports pain at worst 2/10 over past week    Time 3    Period Weeks    Status Achieved    Target Date 04/14/21               PT Long Term Goals - 03/24/21 1152       PT LONG TERM GOAL #1   Title Patient will be I with final HEP to maintain progress from PT    Baseline HEP provided at eval    Time 6    Period Weeks    Status New    Target Date 05/05/21      PT LONG TERM GOAL #2   Title Patient will report no increase in neck pain or tightness with cervical motion to improve household tasks    Baseline patient reports right sided neck tightness with cervical motion    Time 6    Period Weeks    Status New    Target Date 05/05/21      PT LONG TERM GOAL #3   Title Patient will demonstrate >/= 4+/5 MMT of right shoulder to improve lifting overhead without pain    Baseline grossly 4/5 MMT    Time 6    Period Weeks    Status New    Target Date 05/05/21      PT LONG TERM GOAL #4   Title Patient will demonstrate right shoulder AROM grssly WFL and equal to opposite side to impove dressing ability    Baseline patient limited with shoulder elevation and reach behind back    Time 6    Period Weeks    Status New    Target Date 05/05/21      PT LONG TERM GOAL #5   Title Patient will demonstrate DNF endurance >/= 30 sec to improve postural control and reduce pain with activity    Baseline 18 seconds    Time 6    Period Weeks    Status New  Target Date 05/05/21                   Plan - 04/07/21 0931     Clinical Impression Statement Patient tolerated therapy well with no adverse effects. He seems to be improving with report of less overall pain during the week. Continued with manual therapy for right cervical and periscapular musculature, and progressing postural control. He did demonstrate greater  fatigue and shrug compensation on right with machine rows. Patient would benefit from continued skilled PT to progress mobility and strength in order to reduce pain and maximize functional ability.    PT Treatment/Interventions ADLs/Self Care Home Management;Aquatic Therapy;Cryotherapy;Electrical Stimulation;Iontophoresis 4mg /ml Dexamethasone;Moist Heat;Traction;Ultrasound;Neuromuscular re-education;Balance training;Therapeutic exercise;Therapeutic activities;Functional mobility training;Stair training;Gait training;Patient/family education;Manual techniques;Dry needling;Passive range of motion;Taping;Vasopneumatic Device;Spinal Manipulations;Joint Manipulations    PT Next Visit Plan Review HEP and progress PRN, manual/dry needling for right trap/levator/infra/rhomboid, progress postural strength and endurance, right shoulder strengthening    PT Home Exercise Plan [email or web address]    Consulted and Agree with Plan of Care Patient             Patient will benefit from skilled therapeutic intervention in order to improve the following deficits and impairments:  Decreased range of motion, Postural dysfunction, Decreased strength, Improper body mechanics, Decreased activity tolerance, Pain  Visit Diagnosis: Cervicalgia  Chronic right shoulder pain  Muscle weakness (generalized)     Problem List Patient Active Problem List   Diagnosis Date Noted   TIA (transient ischemic attack) 06/02/2018   Hypertension 06/02/2018   Lower back injury 12/07/2013    Hilda Blades, PT, DPT, LAT, ATC 04/07/21  9:54 AM Phone: [phone] Fax: [facility] [address] [place], [place], [address] Phone: [phone]   Fax:  [phone]  Name: Todd Ryan MRN: [id number] Date of Birth: 1960/11/28

## 2021-04-10 ENCOUNTER — Other Ambulatory Visit: Payer: Self-pay

## 2021-04-10 ENCOUNTER — Ambulatory Visit: Payer: Medicaid Other | Admitting: Physical Therapy

## 2021-04-10 ENCOUNTER — Encounter: Payer: Self-pay | Admitting: Physical Therapy

## 2021-04-10 DIAGNOSIS — M25511 Pain in right shoulder: Secondary | ICD-10-CM

## 2021-04-10 DIAGNOSIS — M67813 Other specified disorders of tendon, right shoulder: Secondary | ICD-10-CM

## 2021-04-10 DIAGNOSIS — M542 Cervicalgia: Secondary | ICD-10-CM

## 2021-04-10 DIAGNOSIS — M6281 Muscle weakness (generalized): Secondary | ICD-10-CM

## 2021-04-10 DIAGNOSIS — G8929 Other chronic pain: Secondary | ICD-10-CM

## 2021-04-10 DIAGNOSIS — Z9889 Other specified postprocedural states: Secondary | ICD-10-CM

## 2021-04-10 NOTE — Therapy (Signed)
[place], [place], [address] Phone: [phone]   Fax:  [phone]  Physical Therapy Treatment  Patient Details  Name: Todd Ryan MRN: [id number] Date of Birth: 19-Jul-1960 Referring Provider (PT): Renette Butters, MD   Encounter Date: 04/10/2021   PT End of Session - 04/10/21 1012     Visit Number 4    Number of Visits 12    Date for PT Re-Evaluation 05/05/21    Authorization Type UHC MCD    PT Start Time 0931    PT Stop Time 1025    PT Time Calculation (min) 54 min    Activity Tolerance Patient tolerated treatment well    Behavior During Therapy [facility] for tasks assessed/performed             Past Medical History:  Diagnosis Date   Allergic rhinitis    Arthritis    shoulder, neck, back   Chronic back pain    GAD (generalized anxiety disorder)    GERD (gastroesophageal reflux disease)    History of asthma    child   Hyperlipidemia    Hypertension    MDD (major depressive disorder)    OSA on CPAP    07-15-2020  per pt uses approx. 3 times weekly   Peripheral neuropathy    Pre-diabetes    Rotator cuff tear, right    Wears glasses     Past Surgical History:  Procedure Laterality Date   COLONOSCOPY WITH PROPOFOL  05/31/2020   LUMBAR SPINE SURGERY  11/2014   SHOULDER ARTHROSCOPY WITH ROTATOR CUFF REPAIR AND SUBACROMIAL DECOMPRESSION Right 07/16/2020   Procedure: SHOULDER ARTHROSCOPY WITH ROTATOR CUFF REPAIR AND SUBACROMIAL DECOMPRESSION;  Surgeon: Renette Butters, MD;  Location: [place];  Service: Orthopedics;  Laterality: Right;    There were no vitals filed for this visit.   Subjective Assessment - 04/10/21 0936     Subjective Pt enters with 2/10 R shld pain but complains of 8/10 pain last night.  after he was working on [employer] pulling it up.    Limitations House hold activities;Lifting    Patient Stated Goals Get neck pain relief and improve  lifting ability    Currently in Pain? Yes    Pain Score 2    at worst 8/10   Pain Location Neck    Pain Orientation Right    Pain Descriptors / Indicators Aching    Pain Onset More than a month ago    Pain Frequency Intermittent    Pain Score 2   8/10 at worst   Pain Location Shoulder    Pain Orientation Right    Pain Descriptors / Indicators Aching    Pain Type Chronic pain    Pain Onset More than a month ago                           Access Code: [email or web address]: [email or web address]: 10/13/2022Prepared by: Donnetta Simpers BeardsleyExercises  Seated Levator Scapulae Stretch - 1 x daily - 7 x weekly - 2 reps - 30 hold  Shoulder External Rotation and Scapular Retraction with Resistance - 1 x daily - 7 x weekly - 3 sets - 20 reps  Seated Neck Retraction - 1 x daily - 7 x weekly - 3 sets - 10 reps - 3 hold  Standing Upper Trapezius Mobilization with Small Ball - 1 x daily - 7 x weekly - 2-3 minutes hold  Standing Lat Pull Down with Resistance - Elbows Bent - 1 x daily - 7 x weekly - 3 sets - 10 reps  Serratus Activation at Wall with Foam Roll and Resistance Band - 1 x daily - 7 x weekly - 3 sets - 10 reps    [facility] Adult PT Treatment/Exercise - 04/10/21 0001       Neck Exercises: Standing   Other Standing Exercises lat pull downs with BTB 3 x 10 bil ER with RTB 2 x 10    Other Standing Exercises serratus with red t band on wall 3 x 10 3-5 sec hold  , standing with OH press with dowel and 7.5 lb 2 x 10,      Neck Exercises: Seated   Neck Retraction 10 reps;3 secs      Neck Exercises: Stretches   Upper Trapezius Stretch 3 reps;30 seconds    Levator Stretch 3 reps;30 seconds      Modalities   Modalities Moist Heat      Moist Heat Therapy   Number Minutes Moist Heat 12 Minutes    Moist Heat Location Shoulder;Cervical   R     Manual Therapy   Manual Therapy Joint mobilization;Soft tissue mobilization    Manual therapy comments skilled palpation with  TPDN    Joint Mobilization IR/ER Grade 3 post TPDN    Soft tissue mobilization STW to pectoral mx, sub scapular periscapular              Trigger Point Dry Needling - 04/10/21 0001     Consent Given? Yes    Education Handout Provided Yes    Muscles Treated Upper Quadrant Pectoralis major;Pectoralis minor   R   Other Dry Needling 50 mm  30 gage    Pectoralis Major Response Twitch response elicited;Palpable increased muscle length    Pectoralis Minor Response Twitch response elicited;Palpable increased muscle length                   PT Education - 04/10/21 1017     Education Details education on TPDN and added to HEP/reinforced HEP    Sluka(s) Educated Patient    Methods Explanation;Demonstration;Tactile cues;Verbal cues;Handout    Comprehension Verbalized understanding;Returned demonstration              PT Short Term Goals - 04/10/21 1326       PT SHORT TERM GOAL #1   Title Pt will be independent and compliant with initial HEP to progress with therapy    Baseline progressing with HEP knows initial , needs progressive loading    Time 3    Period Weeks    Status Achieved    Target Date 04/14/21      PT SHORT TERM GOAL #2   Title Patient will report </= 5/10 right sided neck pain with activity to reduce functional limitation    Baseline Pt with no neck pain but 2/10 R shld pain to 0/10 after TPDN  night before 8/10 when pulling boat engine out of boat    Time 3    Period Weeks    Status Partially Met    Target Date 04/14/21               PT Long Term Goals - 03/24/21 1152       PT LONG TERM GOAL #1   Title Patient will be I with final HEP to maintain progress from PT    Baseline HEP provided at eval  Time 6    Period Weeks    Status New    Target Date 05/05/21      PT LONG TERM GOAL #2   Title Patient will report no increase in neck pain or tightness with cervical motion to improve household tasks    Baseline patient reports right  sided neck tightness with cervical motion    Time 6    Period Weeks    Status New    Target Date 05/05/21      PT LONG TERM GOAL #3   Title Patient will demonstrate >/= 4+/5 MMT of right shoulder to improve lifting overhead without pain    Baseline grossly 4/5 MMT    Time 6    Period Weeks    Status New    Target Date 05/05/21      PT LONG TERM GOAL #4   Title Patient will demonstrate right shoulder AROM grssly WFL and equal to opposite side to impove dressing ability    Baseline patient limited with shoulder elevation and reach behind back    Time 6    Period Weeks    Status New    Target Date 05/05/21      PT LONG TERM GOAL #5   Title Patient will demonstrate DNF endurance >/= 30 sec to improve postural control and reduce pain with activity    Baseline 18 seconds    Time 6    Period Weeks    Status New    Target Date 05/05/21                   Plan - 04/10/21 0931     Clinical Impression Statement Pt enters clinic with 2/10 R shld pain.  Pt complained of 8/10 pain in R shld after pullling boat engine out of boat but after TPDN ti R pectoral mx and , pt with no pain aftere TPDN.  Mr Raper would benefit from progressive loading of  muscle under guidance of  PT and then progress to gym once advacned HEP given and indepenent wtih execution.   Pt with STG achieved or partially acieved. Will continue to moniotre and progress post surgery.    Personal Factors and Comorbidities Fitness;Past/Current Experience;Time since onset of injury/illness/exacerbation    Examination-Activity Limitations Reach Overhead;Sleep;Lift;Dressing;Carry    Examination-Participation Restrictions Occupation;Community Activity;Yard Work;Cleaning    PT Frequency 2x / week    PT Duration 6 weeks    PT Treatment/Interventions ADLs/Self Care Home Management;Aquatic Therapy;Cryotherapy;Electrical Stimulation;Iontophoresis 12m/ml Dexamethasone;Moist Heat;Traction;Ultrasound;Neuromuscular  re-education;Balance training;Therapeutic exercise;Therapeutic activities;Functional mobility training;Stair training;Gait training;Patient/family education;Manual techniques;Dry needling;Passive range of motion;Taping;Vasopneumatic Device;Spinal Manipulations;Joint Manipulations    PT Next Visit Plan progress PRN, manual/dry needling for right trap/levator/infra/rhomboid, progress postural strength and endurance, right shoulder strengthening post surgery    PT Home Exercise Plan [email or web address]    Consulted and Agree with Plan of Care Patient             Patient will benefit from skilled therapeutic intervention in order to improve the following deficits and impairments:  Decreased range of motion, Postural dysfunction, Decreased strength, Improper body mechanics, Decreased activity tolerance, Pain  Visit Diagnosis: Cervicalgia  Chronic right shoulder pain  Muscle weakness (generalized)  S/P right rotator cuff repair  Biceps tendonosis of right shoulder     Problem List Patient Active Problem List   Diagnosis Date Noted   TIA (transient ischemic attack) 06/02/2018   Hypertension 06/02/2018   Lower back injury 12/07/2013    LVoncille Lo PT, AAmboy  Certified Exercise Expert for the Aging Adult  04/10/21 1:32 PM Phone: [phone] Fax: [facility] [address] [place], [place], [address] Phone: [phone]   Fax:  [phone]  Name: Todd Ryan MRN: [id number] Date of Birth: Nov 27, 1960

## 2021-04-10 NOTE — Patient Instructions (Addendum)

## 2021-04-14 ENCOUNTER — Encounter: Payer: Self-pay | Admitting: Physical Therapy

## 2021-04-14 ENCOUNTER — Ambulatory Visit: Payer: Medicaid Other | Admitting: Physical Therapy

## 2021-04-14 ENCOUNTER — Other Ambulatory Visit: Payer: Self-pay

## 2021-04-14 DIAGNOSIS — M6281 Muscle weakness (generalized): Secondary | ICD-10-CM

## 2021-04-14 DIAGNOSIS — M542 Cervicalgia: Secondary | ICD-10-CM | POA: Diagnosis not present

## 2021-04-14 DIAGNOSIS — G8929 Other chronic pain: Secondary | ICD-10-CM

## 2021-04-14 NOTE — Therapy (Signed)
[place], [place], [address] Phone: [phone]   Fax:  [phone]  Physical Therapy Treatment  Patient Details  Name: Todd Ryan MRN: [id number] Date of Birth: [birth year] Referring Provider (PT): Renette Butters, MD   Encounter Date: 04/14/2021   PT End of Session - 04/14/21 0921     Visit Number 5    Number of Visits 12    Date for PT Re-Evaluation 05/05/21    Authorization Type UHC MCD    Authorization - Visit Number 4    Authorization - Number of Visits 27    PT Start Time 0916    PT Stop Time 0958    PT Time Calculation (min) 42 min    Activity Tolerance Patient tolerated treatment well    Behavior During Therapy [facility] for tasks assessed/performed             Past Medical History:  Diagnosis Date   Allergic rhinitis    Arthritis    shoulder, neck, back   Chronic back pain    GAD (generalized anxiety disorder)    GERD (gastroesophageal reflux disease)    History of asthma    child   Hyperlipidemia    Hypertension    MDD (major depressive disorder)    OSA on CPAP    07-15-2020  per pt uses approx. 3 times weekly   Peripheral neuropathy    Pre-diabetes    Rotator cuff tear, right    Wears glasses     Past Surgical History:  Procedure Laterality Date   COLONOSCOPY WITH PROPOFOL  05/31/2020   LUMBAR SPINE SURGERY  11/2014   SHOULDER ARTHROSCOPY WITH ROTATOR CUFF REPAIR AND SUBACROMIAL DECOMPRESSION Right 07/16/2020   Procedure: SHOULDER ARTHROSCOPY WITH ROTATOR CUFF REPAIR AND SUBACROMIAL DECOMPRESSION;  Surgeon: Renette Butters, MD;  Location: [place];  Service: Orthopedics;  Laterality: Right;    There were no vitals filed for this visit.   Subjective Assessment - 04/14/21 0918     Subjective Patient reports she is doing well, reports a little pain in the morning when he woke up because he may have slept on it. He reports the dry needling helped  for a little bit last visit, but when he started doing strenuous stuff the pain did come back.    Patient Stated Goals Get neck pain relief and improve lifting ability    Currently in Pain? Yes    Pain Score 3     Pain Location Neck    Pain Orientation Right    Pain Descriptors / Indicators Aching    Pain Type Chronic pain    Pain Radiating Towards right side neck to to right shoulder    Pain Onset More than a month ago    Pain Frequency Intermittent    Aggravating Factors  Lifting of strenuous use of right arm, sleeping on right side                [facility] PT Assessment - 04/14/21 0001       Strength   Overall Strength Comments Periscapular strength grossly 4-/5 MMT on right                     [facility] Adult PT Treatment/Exercise:  Therapeutic Exercise: - UBE L3 x 4 min (2 fwd/bwd) - Doorway pec stretch at 90 deg 3 x 30 sec - Row with blue 2 x 15 - Extension + scap retraction with  blue 2 x 15 - ER with green 2 x 15 (right only) - Push-up plus at counter 2 x 10 - Serratus wall slide with hands/wrists in pillow case 2 x 5 - cued to avoid letting elbows flare out - Prone T with 2# 2 x 10 (right only) - cued for setting shoulder blade to avoid shrug - Prone Y 2 x 10 (right only) - Prone ER at 90 deg abduction with upper arm supported 2 x 10 (right only)   Manual Therapy: - NA   Neuromuscular re-ed: - NA   Therapeutic Activity: - NA                PT Education - 04/14/21 0920     Education Details HEP    Zehner(s) Educated Patient    Methods Explanation;Demonstration;Verbal cues    Comprehension Verbalized understanding;Returned demonstration;Verbal cues required;Need further instruction              PT Short Term Goals - 04/14/21 0956       PT SHORT TERM GOAL #1   Title Pt will be independent and compliant with initial HEP to progress with therapy    Baseline progressing with HEP knows initial , needs progressive loading    Time 3     Period Weeks    Status Achieved    Target Date 04/14/21      PT SHORT TERM GOAL #2   Title Patient will report </= 5/10 right sided neck pain with activity to reduce functional limitation    Baseline Pt reports 3/10 pain at current, will occasionally get > 5/10 pain with strenuous right shoulder use    Time 3    Period Weeks    Status On-going    Target Date 04/14/21               PT Long Term Goals - 03/24/21 1152       PT LONG TERM GOAL #1   Title Patient will be I with final HEP to maintain progress from PT    Baseline HEP provided at eval    Time 6    Period Weeks    Status New    Target Date 05/05/21      PT LONG TERM GOAL #2   Title Patient will report no increase in neck pain or tightness with cervical motion to improve household tasks    Baseline patient reports right sided neck tightness with cervical motion    Time 6    Period Weeks    Status New    Target Date 05/05/21      PT LONG TERM GOAL #3   Title Patient will demonstrate >/= 4+/5 MMT of right shoulder to improve lifting overhead without pain    Baseline grossly 4/5 MMT    Time 6    Period Weeks    Status New    Target Date 05/05/21      PT LONG TERM GOAL #4   Title Patient will demonstrate right shoulder AROM grssly WFL and equal to opposite side to impove dressing ability    Baseline patient limited with shoulder elevation and reach behind back    Time 6    Period Weeks    Status New    Target Date 05/05/21      PT LONG TERM GOAL #5   Title Patient will demonstrate DNF endurance >/= 30 sec to improve postural control and reduce pain with activity    Baseline 18 seconds  Time 6    Period Weeks    Status New    Target Date 05/05/21                   Plan - 04/14/21 5361     Clinical Impression Statement Patient tolerated therapy well with no adverse effects. Therapy focused on progression of rotator cuff and periscapular musculature with good tolerance. Patient reported  feeling like the right shoulder was weaker and fatigued quicker than left. He required consistent cueing to avoid shrug by setting shoulder blade prior to movement. No change to HEP this visit. Patient would benefit from continued skilled PT to progress mobility and strength in order to reduce pain and maximize functional ability.    PT Treatment/Interventions ADLs/Self Care Home Management;Aquatic Therapy;Cryotherapy;Electrical Stimulation;Iontophoresis 4mg /ml Dexamethasone;Moist Heat;Traction;Ultrasound;Neuromuscular re-education;Balance training;Therapeutic exercise;Therapeutic activities;Functional mobility training;Stair training;Gait training;Patient/family education;Manual techniques;Dry needling;Passive range of motion;Taping;Vasopneumatic Device;Spinal Manipulations;Joint Manipulations    PT Next Visit Plan Review HEP and progress PRN, manual/dry needling for right trap/levator/infra/rhomboid, progress postural strength and endurance, right shoulder strengthening post surgery    PT Home Exercise Plan [email or web address]    Consulted and Agree with Plan of Care Patient             Patient will benefit from skilled therapeutic intervention in order to improve the following deficits and impairments:  Decreased range of motion, Postural dysfunction, Decreased strength, Improper body mechanics, Decreased activity tolerance, Pain  Visit Diagnosis: Cervicalgia  Chronic right shoulder pain  Muscle weakness (generalized)     Problem List Patient Active Problem List   Diagnosis Date Noted   TIA (transient ischemic attack) 06/02/2018   Hypertension 06/02/2018   Lower back injury 12/07/2013    Hilda Blades, PT, DPT, LAT, ATC 04/14/21  9:58 AM Phone: [phone] Fax: [facility] [address] [place], [place], [address] Phone: [phone]   Fax:  [phone]  Name: Todd Ryan MRN: [id number] Date of Birth:  07-15-1960

## 2021-04-17 ENCOUNTER — Ambulatory Visit: Payer: Medicaid Other | Admitting: Physical Therapy

## 2021-04-23 ENCOUNTER — Ambulatory Visit: Payer: Self-pay | Admitting: Podiatry

## 2021-04-24 ENCOUNTER — Other Ambulatory Visit: Payer: Self-pay

## 2021-04-24 ENCOUNTER — Ambulatory Visit: Payer: Medicaid Other | Admitting: Physical Therapy

## 2021-04-24 ENCOUNTER — Encounter: Payer: Self-pay | Admitting: Physical Therapy

## 2021-04-24 DIAGNOSIS — M25511 Pain in right shoulder: Secondary | ICD-10-CM

## 2021-04-24 DIAGNOSIS — M542 Cervicalgia: Secondary | ICD-10-CM

## 2021-04-24 DIAGNOSIS — Z9889 Other specified postprocedural states: Secondary | ICD-10-CM

## 2021-04-24 DIAGNOSIS — M67813 Other specified disorders of tendon, right shoulder: Secondary | ICD-10-CM

## 2021-04-24 DIAGNOSIS — G8929 Other chronic pain: Secondary | ICD-10-CM

## 2021-04-24 DIAGNOSIS — M6281 Muscle weakness (generalized): Secondary | ICD-10-CM

## 2021-04-24 NOTE — Therapy (Signed)
[place], [place], [address] Phone: [phone]   Fax:  [phone]  Physical Therapy Treatment  Patient Details  Name: Todd Ryan MRN: [id number] Date of Birth: 19-Apr-1961 Referring Provider (PT): Renette Butters, MD   Encounter Date: 04/24/2021   PT End of Session - 04/24/21 1144     Visit Number 6    Number of Visits 12    Date for PT Re-Evaluation 05/05/21    Authorization Type UHC MCD    PT Start Time 0932    PT Stop Time 2951    PT Time Calculation (min) 43 min    Activity Tolerance Patient tolerated treatment well    Behavior During Therapy [facility] for tasks assessed/performed             Past Medical History:  Diagnosis Date   Allergic rhinitis    Arthritis    shoulder, neck, back   Chronic back pain    GAD (generalized anxiety disorder)    GERD (gastroesophageal reflux disease)    History of asthma    child   Hyperlipidemia    Hypertension    MDD (major depressive disorder)    OSA on CPAP    07-15-2020  per pt uses approx. 3 times weekly   Peripheral neuropathy    Pre-diabetes    Rotator cuff tear, right    Wears glasses     Past Surgical History:  Procedure Laterality Date   COLONOSCOPY WITH PROPOFOL  05/31/2020   LUMBAR SPINE SURGERY  11/2014   SHOULDER ARTHROSCOPY WITH ROTATOR CUFF REPAIR AND SUBACROMIAL DECOMPRESSION Right 07/16/2020   Procedure: SHOULDER ARTHROSCOPY WITH ROTATOR CUFF REPAIR AND SUBACROMIAL DECOMPRESSION;  Surgeon: Renette Butters, MD;  Location: [place];  Service: Orthopedics;  Laterality: Right;    There were no vitals filed for this visit.   Subjective Assessment - 04/24/21 0939     Subjective I am only a 1/10 today.  I am feeling better today.  I dont think I need TPDN and just concentrate on strength    Limitations House hold activities;Lifting    Patient Stated Goals Get neck pain relief and improve lifting ability     Currently in Pain? Yes    Pain Score 0-No pain    Pain Location Neck    Pain Score 1    Pain Location Shoulder    Pain Orientation Right    Pain Descriptors / Indicators Aching    Pain Type Chronic pain    Pain Onset More than a month ago               [facility] Adult PT Treatment/Exercise:   Therapeutic Exercise: - UBE L4x 4 min (2 fwd/bwd) - Doorway pec stretch at 90 deg 3 x 30 sec - Row with blue 2 x 15 - Extension + scap retraction with blue 2 x 15 - ER with blue 2 x 15 (right only) - Push-up plus at mat  (18 inches from ground) 2 x 10  pt tends to lift buttocks during last of reps due to fatigue - Serratus wall slide with hands/wrists in pillow case 3 x 10- cued to avoid letting elbows flare out/ cued to exhale at top and keep ribs down - Wall clocks with green t band R and L hand  2 minutes - Prone T with 2# 2 x 10 bil - cued for setting shoulder blade to avoid shrug and chin tuck for lengthened  cervical - Prone Y   2 # DB 2 x 10 (bil) - Prone ER at 90 deg abduction with upper arm supported 2 x 10 (right only)   Manual Therapy: - NA   Neuromuscular re-ed: - NA   Therapeutic Activity: - NA                         PT Education - 04/24/21 1013     Education Details added wall clock with green t band and push up plus from 18 in mat. reveiwed HEP    Kiner(s) Educated Patient    Methods Explanation;Demonstration;Tactile cues;Verbal cues;Handout    Comprehension Verbalized understanding;Returned demonstration              PT Short Term Goals - 04/14/21 0956       PT SHORT TERM GOAL #1   Title Pt will be independent and compliant with initial HEP to progress with therapy    Baseline progressing with HEP knows initial , needs progressive loading    Time 3    Period Weeks    Status Achieved    Target Date 04/14/21      PT SHORT TERM GOAL #2   Title Patient will report </= 5/10 right sided neck pain with activity to reduce functional  limitation    Baseline Pt reports 3/10 pain at current, will occasionally get > 5/10 pain with strenuous right shoulder use    Time 3    Period Weeks    Status On-going    Target Date 04/14/21               PT Long Term Goals - 03/24/21 1152       PT LONG TERM GOAL #1   Title Patient will be I with final HEP to maintain progress from PT    Baseline HEP provided at eval    Time 6    Period Weeks    Status New    Target Date 05/05/21      PT LONG TERM GOAL #2   Title Patient will report no increase in neck pain or tightness with cervical motion to improve household tasks    Baseline patient reports right sided neck tightness with cervical motion    Time 6    Period Weeks    Status New    Target Date 05/05/21      PT LONG TERM GOAL #3   Title Patient will demonstrate >/= 4+/5 MMT of right shoulder to improve lifting overhead without pain    Baseline grossly 4/5 MMT    Time 6    Period Weeks    Status New    Target Date 05/05/21      PT LONG TERM GOAL #4   Title Patient will demonstrate right shoulder AROM grssly WFL and equal to opposite side to impove dressing ability    Baseline patient limited with shoulder elevation and reach behind back    Time 6    Period Weeks    Status New    Target Date 05/05/21      PT LONG TERM GOAL #5   Title Patient will demonstrate DNF endurance >/= 30 sec to improve postural control and reduce pain with activity    Baseline 18 seconds    Time 6    Period Weeks    Status New    Target Date 05/05/21  Plan - 04/24/21 0947     Clinical Impression Statement Pt complained of minimal pain 1/10 and tolerated exercise for entire session with no adverse effects.  Pt was able to progress from counter push ups to mat push ups 18 inch from floor.  Pt was more mindful of setting shoulder today during exercises. Added to HEP this session.  will continue to progress strength in order toreduce pain and maximize  functional ability.    Personal Factors and Comorbidities Fitness;Past/Current Experience;Time since onset of injury/illness/exacerbation    Examination-Activity Limitations Reach Overhead;Sleep;Lift;Dressing;Carry    Examination-Participation Restrictions Occupation;Community Activity;Yard Work;Cleaning    PT Treatment/Interventions ADLs/Self Care Home Management;Aquatic Therapy;Cryotherapy;Electrical Stimulation;Iontophoresis 4mg /ml Dexamethasone;Moist Heat;Traction;Ultrasound;Neuromuscular re-education;Balance training;Therapeutic exercise;Therapeutic activities;Functional mobility training;Stair training;Gait training;Patient/family education;Manual techniques;Dry needling;Passive range of motion;Taping;Vasopneumatic Device;Spinal Manipulations;Joint Manipulations    PT Next Visit Plan FOTO and goals next visit manual/dry needling for right trap/levator/infra/rhomboid, progress postural strength and endurance, right shoulder strengthening post surgery    PT Home Exercise Plan [email or web address]    Consulted and Agree with Plan of Care Patient             Patient will benefit from skilled therapeutic intervention in order to improve the following deficits and impairments:  Decreased range of motion, Postural dysfunction, Decreased strength, Improper body mechanics, Decreased activity tolerance, Pain  Visit Diagnosis: Cervicalgia  Chronic right shoulder pain  S/P right rotator cuff repair  Muscle weakness (generalized)  Biceps tendonosis of right shoulder     Problem List Patient Active Problem List   Diagnosis Date Noted   TIA (transient ischemic attack) 06/02/2018   Hypertension 06/02/2018   Lower back injury 12/07/2013    Voncille Lo, PT, [place] Certified Exercise Expert for the Aging Adult  04/24/21 11:51 AM Phone: [phone] Fax: [place] [facility] [address] [place], [place], [address] Phone: [phone]    Fax:  [phone]  Name: Todd Ryan MRN: [id number] Date of Birth: [birth year]

## 2021-04-24 NOTE — Patient Instructions (Addendum)
Access Code: [email or web address]: [email or web address]: 10/27/2022Prepared by: Donnetta Simpers BeardsleyExercises    Wall Clock with Theraband - 1 x daily - 7 x weekly - 3 sets - 10 reps  Prone Shoulder External Rotation with Towel Roll - 1 x daily - 7 x weekly - 3 sets - 10 reps      Voncille Lo, PT, [facility] Certified Exercise Expert for the Aging Adult  04/24/21 10:13 AM Phone: [phone] Fax: [phone]

## 2021-04-28 ENCOUNTER — Ambulatory Visit: Payer: Medicaid Other | Admitting: Physical Therapy

## 2021-04-28 ENCOUNTER — Ambulatory Visit: Payer: Medicaid Other | Admitting: Podiatry

## 2021-04-28 ENCOUNTER — Other Ambulatory Visit: Payer: Self-pay

## 2021-04-28 DIAGNOSIS — L989 Disorder of the skin and subcutaneous tissue, unspecified: Secondary | ICD-10-CM

## 2021-04-28 NOTE — Progress Notes (Signed)
   Subjective: 60 y.o. male presenting to the office today as a new patient for evaluation of pain and tenderness to the bilateral fifth toe joints.  Patient states that he has been experiencing pain and tenderness because of calluses that developed.  He has had them shaved down before in the past which gave him some temporary relief.  He presents for further treatment and evaluation   Past Medical History:  Diagnosis Date   Allergic rhinitis    Arthritis    shoulder, neck, back   Chronic back pain    GAD (generalized anxiety disorder)    GERD (gastroesophageal reflux disease)    History of asthma    child   Hyperlipidemia    Hypertension    MDD (major depressive disorder)    OSA on CPAP    07-15-2020  per pt uses approx. 3 times weekly   Peripheral neuropathy    Pre-diabetes    Rotator cuff tear, right    Wears glasses      Objective:  Physical Exam General: Alert and oriented x3 in no acute distress  Dermatology: Hyperkeratotic lesion(s) present on the plantar aspect fifth MTP joint bilateral. Pain on palpation with a central nucleated core noted. Skin is warm, dry and supple bilateral lower extremities. Negative for open lesions or macerations.  Vascular: Palpable pedal pulses bilaterally. No edema or erythema noted. Capillary refill within normal limits.  Neurological: Epicritic and protective threshold grossly intact bilaterally.   Musculoskeletal Exam: Pain on palpation at the keratotic lesion(s) noted. Range of motion within normal limits bilateral. Muscle strength 5/5 in all groups bilateral.  Assessment: 1.  Porokeratosis fifth MTP bilateral   Plan of Care:  1. Patient evaluated 2. Excisional debridement of keratoic lesion(s) using a chisel blade was performed without incident.  Salicylic acid applied. 3. Dressed area with light dressing.  Recommend OTC corn callus remover daily 4. Patient is to return to the clinic PRN.   Edrick Kins, DPM [facility]  Dr. Edrick Kins, [place]                                        [place], [place] [address]                Office [phone]  Fax [phone]

## 2021-05-01 ENCOUNTER — Ambulatory Visit: Payer: Medicaid Other | Admitting: Physical Therapy

## 2021-05-06 ENCOUNTER — Ambulatory Visit: Payer: Medicaid Other | Attending: Orthopedic Surgery | Admitting: Physical Therapy

## 2021-05-06 ENCOUNTER — Encounter: Payer: Self-pay | Admitting: Physical Therapy

## 2021-05-06 ENCOUNTER — Other Ambulatory Visit: Payer: Self-pay

## 2021-05-06 DIAGNOSIS — Z9889 Other specified postprocedural states: Secondary | ICD-10-CM | POA: Diagnosis present

## 2021-05-06 DIAGNOSIS — M6281 Muscle weakness (generalized): Secondary | ICD-10-CM | POA: Diagnosis present

## 2021-05-06 DIAGNOSIS — M25511 Pain in right shoulder: Secondary | ICD-10-CM | POA: Diagnosis present

## 2021-05-06 DIAGNOSIS — M542 Cervicalgia: Secondary | ICD-10-CM | POA: Diagnosis present

## 2021-05-06 DIAGNOSIS — M67813 Other specified disorders of tendon, right shoulder: Secondary | ICD-10-CM | POA: Insufficient documentation

## 2021-05-06 DIAGNOSIS — G8929 Other chronic pain: Secondary | ICD-10-CM | POA: Diagnosis present

## 2021-05-06 NOTE — Therapy (Signed)
[place], [place], [address] Phone: [phone]   Fax:  [phone]  Physical Therapy Treatment / ERO  Patient Details  Name: Todd Ryan MRN: [id number] Date of Birth: January 04, 1961 Referring Provider (PT): Renette Butters, MD   Encounter Date: 05/06/2021   PT End of Session - 05/06/21 0856     Visit Number 7    Number of Visits 13    Date for PT Re-Evaluation 06/17/21    Authorization Type UHC MCD    Authorization - Number of Visits 27   7 minutes dry needling performed   PT Start Time 0830    PT Stop Time 0915    PT Time Calculation (min) 45 min    Activity Tolerance Patient tolerated treatment well    Behavior During Therapy [facility] for tasks assessed/performed             Past Medical History:  Diagnosis Date   Allergic rhinitis    Arthritis    shoulder, neck, back   Chronic back pain    GAD (generalized anxiety disorder)    GERD (gastroesophageal reflux disease)    History of asthma    child   Hyperlipidemia    Hypertension    MDD (major depressive disorder)    OSA on CPAP    07-15-2020  per pt uses approx. 3 times weekly   Peripheral neuropathy    Pre-diabetes    Rotator cuff tear, right    Wears glasses     Past Surgical History:  Procedure Laterality Date   COLONOSCOPY WITH PROPOFOL  05/31/2020   LUMBAR SPINE SURGERY  11/2014   SHOULDER ARTHROSCOPY WITH ROTATOR CUFF REPAIR AND SUBACROMIAL DECOMPRESSION Right 07/16/2020   Procedure: SHOULDER ARTHROSCOPY WITH ROTATOR CUFF REPAIR AND SUBACROMIAL DECOMPRESSION;  Surgeon: Renette Butters, MD;  Location: [place];  Service: Orthopedics;  Laterality: Right;    There were no vitals filed for this visit.   Subjective Assessment - 05/06/21 0829     Subjective Patient reports the neck is hurting this morning, states he woke up hurting and usually it goes away but hasn't. Patient also notes the right shoulder  continues to bother him, pain typically starts around the shoulder and travels to the neck. He states he is doing exercises a little bit but not consistently.    Patient Stated Goals Get neck pain relief and improve lifting ability    Currently in Pain? Yes    Pain Score 4     Pain Location Neck    Pain Orientation Right    Pain Descriptors / Indicators Aching;Tightness    Pain Type Chronic pain    Pain Onset More than a month ago    Pain Frequency Intermittent    Aggravating Factors  Lifting or strenuous use of right arm, turning to right, sleeping on right side    Pain Relieving Factors Rest, avoid aggravating activities                [facility] PT Assessment - 05/06/21 0001       Assessment   Medical Diagnosis Neck    Referring Provider (PT) Renette Butters, MD      Precautions   Precautions None      Restrictions   Weight Bearing Restrictions No      Balance Screen   Has the patient fallen in the past 6 months No      Prior Function   Level  of Independence Independent      Observation/Other Assessments   Focus on Therapeutic Outcomes (FOTO)  NA - MCD      AROM   Right Shoulder Flexion 160 Degrees    Right Shoulder ABduction 160 Degrees   compensated scaption at end range   Right Shoulder Internal Rotation --   reach to L1   Right Shoulder External Rotation --   reach to T2   Left Shoulder Flexion 165 Degrees    Left Shoulder ABduction 170 Degrees    Left Shoulder Internal Rotation --   reach to T8   Left Shoulder External Rotation --   reach to T2   Cervical Flexion 45    Cervical Extension 45    Cervical - Right Side Bend 35   right sided neck tightness   Cervical - Left Side Bend 30   right sided neck tightness   Cervical - Right Rotation 60   right sided neck tightness   Cervical - Left Rotation 70      Strength   Overall Strength Comments Periscapular strength grossly 4/5 MMT on right      Special Tests   Other special tests DNF endurance test: 24  seconds                 [facility] Adult PT Treatment/Exercise:  Therapeutic Exercise: UBE L4 x 4 min (2 fwd/bwd) while taking subjective Upper trap stretch 3 x 20 sec on right Row with blue 2 x 20 Extension + scap retraction with blue 2 x 20 ER with blue 2 x 20  Manual Therapy: Skilled palpation and monitoring while performing TPDN STM and myofascial release for right upper trap and infraspinatus  Neuromuscular re-ed: N/A  Therapeutic Activity: N/A  Modalities: N/A  Self Care: N/A  Trigger Point Dry Needling Treatment: Pre-treatment instruction: Patient instructed on dry needling rationale, procedures, and possible side effects including pain during treatment (achy,cramping feeling), bruising, drop of blood, lightheadedness, nausea, sweating. Patient Consent Given: Yes Education handout provided: Previously provided Muscles treated: Right upper trap and infraspinatus  Needle size and number: .30x24mm x 2 Electrical stimulation performed: No Parameters: N/A Treatment response/outcome: Twitch response elicited, Palpable decrease in muscle tension, and increased motion with less pain Post-treatment instructions: Patient instructed to expect possible mild to moderate muscle soreness later today and/or tomorrow. Patient instructed in methods to reduce muscle soreness and to continue prescribed HEP. If patient was dry needled over the lung field, patient was instructed on signs and symptoms of pneumothorax and, however unlikely, to see immediate medical attention should they occur. Patient was also educated on signs and symptoms of infection and to seek medical attention should they occur. Patient verbalized understanding of these instructions and education.                   PT Education - 05/06/21 [phone]     Education Details POC update, dry needling, HEP consistency    Mcquary(s) Educated Patient    Methods Explanation;Demonstration;Verbal cues     Comprehension Verbalized understanding;Returned demonstration;Verbal cues required;Need further instruction              PT Short Term Goals - 05/06/21 0910       PT SHORT TERM GOAL #1   Title Pt will be independent and compliant with initial HEP to progress with therapy    Baseline progressing with HEP knows initial , needs progressive loading    Time 3    Period Weeks  Status Achieved    Target Date 04/14/21      PT SHORT TERM GOAL #2   Title Patient will report </= 5/10 right sided neck pain with activity to reduce functional limitation    Baseline Pt reports current pain 5/10 neck/shoulder, pain will occasionally get > 5/10 with heavy lifitng or strenuous activity    Time 3    Period Weeks    Status On-going    Target Date 05/27/21               PT Long Term Goals - 05/06/21 0911       PT LONG TERM GOAL #1   Title Patient will be I with final HEP to maintain progress from PT    Baseline progressing    Time 6    Period Weeks    Status On-going    Target Date 06/17/21      PT LONG TERM GOAL #2   Title Patient will report no increase in neck pain or tightness with cervical motion to improve household tasks    Baseline patient reports right sided neck tightness with cervical motion    Time 6    Period Weeks    Status On-going    Target Date 06/17/21      PT LONG TERM GOAL #3   Title Patient will demonstrate >/= 4+/5 MMT of right shoulder to improve lifting overhead without pain    Baseline grossly 4/5 MMT    Time 6    Period Weeks    Status On-going    Target Date 06/17/21      PT LONG TERM GOAL #4   Title Patient will demonstrate right shoulder AROM grssly WFL and equal to opposite side to impove dressing ability    Baseline patient limited with shoulder elevation and reach behind back on right    Time 6    Period Weeks    Status On-going    Target Date 06/17/21      PT LONG TERM GOAL #5   Title Patient will demonstrate DNF endurance >/= 30 sec  to improve postural control and reduce pain with activity    Baseline 24 seconds    Time 6    Period Weeks    Status On-going    Target Date 06/17/21                   Plan - 05/06/21 0857     Clinical Impression Statement Patient tolerated therapy well with no adverse effects. Patient continues to demonstrate limitation in cervical and right shoulder motion with report of right upper trap tightness. He also demonstrates continued periscapular and rotator cuff weakness on right. Overall he has improved since beginning of therapy with his motion, strength, pain level and function demonstrating good progress toward goals. Performed dry needling this visit for right upper trap and infraspinatus region, patient demonstrates 5 deg improvement in both right shoulder flexion and right cervical rotation post needling with less pain/tightness. Continued progression of periscapular and rotator cuff strengthening for right shoulder, patient did not report any increase in pain but noted fatigue and muscle burn with exercises. Will re-cert patient 1x/week for 6 more weeks as he would benefit from continued skilled PT to progress mobility and strength in order to reduce pain and improve lifting ability using right arm.    PT Frequency 1x / week    PT Duration 6 weeks    PT Treatment/Interventions ADLs/Self Care Home Management;Aquatic Therapy;Cryotherapy;Electrical Stimulation;Iontophoresis  4mg /ml Dexamethasone;Moist Heat;Traction;Ultrasound;Neuromuscular re-education;Balance training;Therapeutic exercise;Therapeutic activities;Functional mobility training;Stair training;Gait training;Patient/family education;Manual techniques;Dry needling;Passive range of motion;Taping;Vasopneumatic Device;Spinal Manipulations;Joint Manipulations    PT Next Visit Plan Review HEP and progress PRN, manual/dry needling for right trap/levator/infra/rhomboid/pec/lat PRN, progress postural strength and endurance, right shoulder  rotator cuff and periscapular strengthening    PT Home Exercise Plan [email or web address]    Consulted and Agree with Plan of Care Patient             Patient will benefit from skilled therapeutic intervention in order to improve the following deficits and impairments:  Decreased range of motion, Postural dysfunction, Decreased strength, Improper body mechanics, Decreased activity tolerance, Pain  Visit Diagnosis: Cervicalgia  Chronic right shoulder pain  Muscle weakness (generalized)     Problem List Patient Active Problem List   Diagnosis Date Noted   Body mass index (BMI) 32.0-32.9, adult 02/19/2021   Right shoulder pain 02/19/2021   Neck pain 11/21/2020   DDD (degenerative disc disease), lumbosacral 10/20/2019   TIA (transient ischemic attack) 06/02/2018   Hypertension 96/75/9163   Complication of surgical procedure 06/03/2015   Displacement of lumbar intervertebral disc without myelopathy 08/27/2014   Lower back injury 12/07/2013    Hilda Blades, PT, DPT, LAT, ATC 05/06/21  9:18 AM Phone: [phone] Fax: [place] [facility] [address] [place], [place], [address] Phone: [phone]   Fax:  [phone]  Name: Todd Ryan MRN: [id number] Date of Birth: Jul 24, 1960

## 2021-05-08 ENCOUNTER — Encounter: Payer: Self-pay | Admitting: Physical Therapy

## 2021-05-08 ENCOUNTER — Ambulatory Visit: Payer: Medicaid Other | Admitting: Physical Therapy

## 2021-05-08 ENCOUNTER — Other Ambulatory Visit: Payer: Self-pay

## 2021-05-08 DIAGNOSIS — Z9889 Other specified postprocedural states: Secondary | ICD-10-CM

## 2021-05-08 DIAGNOSIS — M542 Cervicalgia: Secondary | ICD-10-CM

## 2021-05-08 DIAGNOSIS — G8929 Other chronic pain: Secondary | ICD-10-CM

## 2021-05-08 DIAGNOSIS — M6281 Muscle weakness (generalized): Secondary | ICD-10-CM

## 2021-05-08 DIAGNOSIS — M67813 Other specified disorders of tendon, right shoulder: Secondary | ICD-10-CM

## 2021-05-08 NOTE — Therapy (Addendum)
[place], [place], [address] Phone: [phone]   Fax:  [phone]  Physical Therapy Treatment  Patient Details  Name: Todd Ryan MRN: [id number] Date of Birth: 23-Jul-1960 Referring Provider (PT): Renette Butters, MD   Encounter Date: 05/08/2021   PT End of Session - 05/08/21 0801     Visit Number 8    Number of Visits 13    Date for PT Re-Evaluation 06/17/21    Authorization Type U[employer] MCD    PT Start Time 0800    PT Stop Time 0845    PT Time Calculation (min) 45 min    Activity Tolerance Patient tolerated treatment well    Behavior During Therapy [facility] for tasks assessed/performed             Past Medical History:  Diagnosis Date   Allergic rhinitis    Arthritis    shoulder, neck, back   Chronic back pain    GAD (generalized anxiety disorder)    GERD (gastroesophageal reflux disease)    History of asthma    child   Hyperlipidemia    Hypertension    MDD (major depressive disorder)    OSA on CPAP    07-15-2020  per pt uses approx. 3 times weekly   Peripheral neuropathy    Pre-diabetes    Rotator cuff tear, right    Wears glasses     Past Surgical History:  Procedure Laterality Date   COLONOSCOPY WITH PROPOFOL  05/31/2020   LUMBAR SPINE SURGERY  11/2014   SHOULDER ARTHROSCOPY WITH ROTATOR CUFF REPAIR AND SUBACROMIAL DECOMPRESSION Right 07/16/2020   Procedure: SHOULDER ARTHROSCOPY WITH ROTATOR CUFF REPAIR AND SUBACROMIAL DECOMPRESSION;  Surgeon: Renette Butters, MD;  Location: [place];  Service: Orthopedics;  Laterality: Right;    There were no vitals filed for this visit.   Subjective Assessment - 05/08/21 0820     Subjective Pt reports no pain after TPDN .  Pt reports he is feeling better.  He does have some pain when he rises from sleeping at night on his shoulder.  I go to the bathroom 2 or 3 times a night and I cant push up on my shoulder because I  have been sleeping on it.  I feel like I cannot push up on my arm    Limitations House hold activities;Lifting    Patient Stated Goals Get neck pain relief and improve lifting ability    Currently in Pain? No/denies    Pain Score 0-No pain    Pain Location Neck    Pain Orientation Right                 [facility] Adult PT Treatment/Exercise:   Therapeutic Exercise: UBE L4.5  x 4 min (2 fwd/bwd) while taking subjective Upper trap stretch 3 x 20 sec on right and 3 x 20 sec for right levator Row with blue 2 x 20 Extension + scap retraction with blue 2 x 20 Side bridge on R for 3 x 30 sec ER with blue 2 x 20 Wall clock with green tband on wall 2 minutes Push- up plus 1 x 5reps Body blade on R moving into flexion and moving into abduction while 1/2 kneeling on there ex pad R knee Holding 15 lb KB in 90 abd/90 ER while walking   1)19 sec  2) 31 sec  3)40 sec  Between exercises performed nerve glides for Ulnar, radial and medial.  (  Bird man and waiter tip and holding tray)   Manual Therapy: N/A   Neuromuscular re-ed: N/A   Therapeutic Activity: N/A   Modalities: N/A   Self Care: Sleep hygiene verbally,  shoulder positioning for comfort and sleep   Added to HEP Access Code: [email or web address]: [email or web address]: 11/10/2022Prepared by: Donnetta Simpers BeardsleyExercises   Side Plank on Knees - 1 x daily - 7 x weekly - 1 sets - 3 reps - 30 sec hold                       PT Education - 05/08/21 0856     Education Details added side plank to HEP for shld stability    Dapolito(s) Educated Patient    Methods Explanation;Demonstration;Tactile cues;Verbal cues;Handout    Comprehension Verbalized understanding;Returned demonstration              PT Short Term Goals - 05/06/21 0910       PT SHORT TERM GOAL #1   Title Pt will be independent and compliant with initial HEP to progress with therapy    Baseline progressing with HEP knows initial ,  needs progressive loading    Time 3    Period Weeks    Status Achieved    Target Date 04/14/21      PT SHORT TERM GOAL #2   Title Patient will report </= 5/10 right sided neck pain with activity to reduce functional limitation    Baseline Pt reports current pain 5/10 neck/shoulder, pain will occasionally get > 5/10 with heavy lifitng or strenuous activity    Time 3    Period Weeks    Status On-going    Target Date 05/27/21               PT Long Term Goals - 05/06/21 0911       PT LONG TERM GOAL #1   Title Patient will be I with final HEP to maintain progress from PT    Baseline progressing    Time 6    Period Weeks    Status On-going    Target Date 06/17/21      PT LONG TERM GOAL #2   Title Patient will report no increase in neck pain or tightness with cervical motion to improve household tasks    Baseline patient reports right sided neck tightness with cervical motion    Time 6    Period Weeks    Status On-going    Target Date 06/17/21      PT LONG TERM GOAL #3   Title Patient will demonstrate >/= 4+/5 MMT of right shoulder to improve lifting overhead without pain    Baseline grossly 4/5 MMT    Time 6    Period Weeks    Status On-going    Target Date 06/17/21      PT LONG TERM GOAL #4   Title Patient will demonstrate right shoulder AROM grssly WFL and equal to opposite side to impove dressing ability    Baseline patient limited with shoulder elevation and reach behind back on right    Time 6    Period Weeks    Status On-going    Target Date 06/17/21      PT LONG TERM GOAL #5   Title Patient will demonstrate DNF endurance >/= 30 sec to improve postural control and reduce pain with activity    Baseline 24 seconds    Time 6    Period Weeks  Status On-going    Target Date 06/17/21                   Plan - 05/08/21 0981     Clinical Impression Statement Todd Ryan enters clinic without pain in R shld post TPDN session last RX session.  Pt  participated in exercises for strengthening and nerve glides between heavier lifting this session.  Pt without pain but tight after demanding exercises from this session.  Pt able to maintain 15 lb KB in 90/90 shld abd ER while walking today from 19 sec to 40 sec before fatigue.  Pt will continue to benefit from contiued progressive loading for remaining visits and completion of goals    Personal Factors and Comorbidities Fitness;Past/Current Experience;Time since onset of injury/illness/exacerbation    Examination-Activity Limitations Reach Overhead;Sleep;Lift;Dressing;Carry    Examination-Participation Restrictions Occupation;Community Activity;Yard Work;Cleaning    PT Frequency 1x / week    PT Duration 6 weeks    PT Treatment/Interventions ADLs/Self Care Home Management;Aquatic Therapy;Cryotherapy;Electrical Stimulation;Iontophoresis 4mg /ml Dexamethasone;Moist Heat;Traction;Ultrasound;Neuromuscular re-education;Balance training;Therapeutic exercise;Therapeutic activities;Functional mobility training;Stair training;Gait training;Patient/family education;Manual techniques;Dry needling;Passive range of motion;Taping;Vasopneumatic Device;Spinal Manipulations;Joint Manipulations    PT Next Visit Plan Review HEP and progress PRN, manual/dry needling for right trap/levator/infra/rhomboid/pec/lat PRN, progress postural strength and endurance, right shoulder rotator cuff and periscapular strengthening    PT Home Exercise Plan [email or web address]    Consulted and Agree with Plan of Care Patient             Patient will benefit from skilled therapeutic intervention in order to improve the following deficits and impairments:  Decreased range of motion, Postural dysfunction, Decreased strength, Improper body mechanics, Decreased activity tolerance, Pain  Visit Diagnosis: Cervicalgia  Chronic right shoulder pain  Muscle weakness (generalized)  S/P right rotator cuff repair  Biceps tendonosis of right  shoulder     Problem List Patient Active Problem List   Diagnosis Date Noted   Body mass index (BMI) 32.0-32.9, adult 02/19/2021   Right shoulder pain 02/19/2021   Neck pain 11/21/2020   DDD (degenerative disc disease), lumbosacral 10/20/2019   TIA (transient ischemic attack) 06/02/2018   Hypertension 19/14/7829   Complication of surgical procedure 06/03/2015   Displacement of lumbar intervertebral disc without myelopathy 08/27/2014   Lower back injury 12/07/2013   Voncille Lo, PT, [place] Certified Exercise Expert for the Aging Adult  05/08/21 8:57 AM Phone: [phone] Fax: [place] [facility] [address] [place], [place], [address] Phone: [phone]   Fax:  [phone]  Name: Todd Ryan MRN: [id number] Date of Birth: 1960-07-18

## 2021-05-08 NOTE — Patient Instructions (Signed)
Access Code: [email or web address]: [email or web address]: 11/10/2022Prepared by: Donnetta Simpers BeardsleyExercises   Side Plank on Knees - 1 x daily - 7 x weekly - 1 sets - 3 reps - 30 sec hold  Todd Ryan, PT, [facility] Certified Exercise Expert for the Aging Adult  05/08/21 8:28 AM Phone: [phone] Fax: [phone]

## 2021-05-13 ENCOUNTER — Ambulatory Visit: Payer: Medicaid Other | Admitting: Physical Therapy

## 2021-05-13 ENCOUNTER — Other Ambulatory Visit: Payer: Self-pay

## 2021-05-13 DIAGNOSIS — Z9889 Other specified postprocedural states: Secondary | ICD-10-CM

## 2021-05-13 DIAGNOSIS — M6281 Muscle weakness (generalized): Secondary | ICD-10-CM

## 2021-05-13 DIAGNOSIS — M542 Cervicalgia: Secondary | ICD-10-CM | POA: Diagnosis not present

## 2021-05-13 DIAGNOSIS — M25511 Pain in right shoulder: Secondary | ICD-10-CM

## 2021-05-13 DIAGNOSIS — G8929 Other chronic pain: Secondary | ICD-10-CM

## 2021-05-13 DIAGNOSIS — M67813 Other specified disorders of tendon, right shoulder: Secondary | ICD-10-CM

## 2021-05-13 NOTE — Therapy (Signed)
[place], [place], [address] Phone: [phone]   Fax:  [phone]  Physical Therapy Treatment  Patient Details  Name: Todd Ryan MRN: [id number] Date of Birth: May 18, 1961 Referring Provider (PT): Renette Butters, MD   Encounter Date: 05/13/2021   PT End of Session - 05/13/21 0903     Visit Number 9    Number of Visits 13    Date for PT Re-Evaluation 06/17/21    Authorization Type UHC MCD    PT Start Time 9371    PT Stop Time 0930    PT Time Calculation (min) 43 min             Past Medical History:  Diagnosis Date   Allergic rhinitis    Arthritis    shoulder, neck, back   Chronic back pain    GAD (generalized anxiety disorder)    GERD (gastroesophageal reflux disease)    History of asthma    child   Hyperlipidemia    Hypertension    MDD (major depressive disorder)    OSA on CPAP    07-15-2020  per pt uses approx. 3 times weekly   Peripheral neuropathy    Pre-diabetes    Rotator cuff tear, right    Wears glasses     Past Surgical History:  Procedure Laterality Date   COLONOSCOPY WITH PROPOFOL  05/31/2020   LUMBAR SPINE SURGERY  11/2014   SHOULDER ARTHROSCOPY WITH ROTATOR CUFF REPAIR AND SUBACROMIAL DECOMPRESSION Right 07/16/2020   Procedure: SHOULDER ARTHROSCOPY WITH ROTATOR CUFF REPAIR AND SUBACROMIAL DECOMPRESSION;  Surgeon: Renette Butters, MD;  Location: [place];  Service: Orthopedics;  Laterality: Right;    There were no vitals filed for this visit.   Subjective Assessment - 05/13/21 0858     Subjective I tried the towel and it did not work.  I try to use pillows under my right arm  on my stomach to go to sleep but I move in the night.  I do sleep on my left shld at night   I am about a 1/10 without medication.    Limitations House hold activities;Lifting    Patient Stated Goals Get neck pain relief and improve lifting ability    Currently in  Pain? Yes    Pain Score 1     Pain Location Neck    Pain Orientation Right    Pain Descriptors / Indicators Aching;Tightness    Pain Type Chronic pain    Pain Onset More than a month ago    Pain Frequency Intermittent    Pain Score 1    Pain Location Shoulder    Pain Orientation Right    Pain Descriptors / Indicators Aching    Pain Type Chronic pain    Pain Onset More than a month ago                 [facility] Adult PT Treatment/Exercise:   Therapeutic Exercise: UBE L6 x 4 min (2 fwd/bwd) while taking subjective Upper trap stretch 3 x 20 sec on right and 3 x 20 sec for right levator Row with blue 2 x 20 Extension + scap retraction with blue 2 x 20 Side bridge on R for 3 x 30 sec ER with blue 2 x 20 Wall clock with green tband on wall 2 minutes Push- up plus 1 x 5reps Body blade on R moving into flexion and moving into abduction while 1/2 kneeling on  there ex pad R knee Overhead press with 20 lb x 3,  20lb x 5    Between exercises performed nerve glides for Ulnar, radial and medial.  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)   Manual Therapy: Trigger Point Dry Needling Treatment: Pre-treatment instruction: Patient instructed on dry needling rationale, procedures, and possible side effects including pain during treatment (achy,cramping feeling), bruising, drop of blood, lightheadedness, nausea, sweating. Patient Consent Given: Yes Education handout provided: Previously provided Muscles treated: Right upper trap and levator Needle size and number: .30x67mm x 4 Electrical stimulation performed: No Parameters: N/A Treatment response/outcome: Twitch response elicited, Palpable decrease in muscle tension, and increased motion with less pain Post-treatment instructions: Patient instructed to expect possible mild to moderate muscle soreness later today and/or tomorrow. Patient instructed in methods to reduce muscle soreness and to continue prescribed HEP.  Manual STW to R upper  trap and levator Cervical rotation PROM rotation with cavitiation   Neuromuscular re-ed: N/A   Therapeutic Activity: N/A   Modalities: N/A   Self Care: N/A   not done this session Holding 15 lb KB in 90 abd/90 ER while walking   1)19 sec  2) 31 sec  3)40 sec              Trigger Point Dry Needling - 05/13/21 0001     Consent Given? Yes    Education Handout Provided Previously provided    Muscles Treated Head and Neck Levator scapulae;Upper trapezius   right   Other Dry Needling 50 mm  30 gage    Upper Trapezius Response Twitch reponse elicited;Palpable increased muscle length    Levator Scapulae Response Twitch response elicited;Palpable increased muscle length                     PT Short Term Goals - 05/06/21 0910       PT SHORT TERM GOAL #1   Title Pt will be independent and compliant with initial HEP to progress with therapy    Baseline progressing with HEP knows initial , needs progressive loading    Time 3    Period Weeks    Status Achieved    Target Date 04/14/21      PT SHORT TERM GOAL #2   Title Patient will report </= 5/10 right sided neck pain with activity to reduce functional limitation    Baseline Pt reports current pain 5/10 neck/shoulder, pain will occasionally get > 5/10 with heavy lifitng or strenuous activity    Time 3    Period Weeks    Status On-going    Target Date 05/27/21               PT Long Term Goals - 05/06/21 0911       PT LONG TERM GOAL #1   Title Patient will be I with final HEP to maintain progress from PT    Baseline progressing    Time 6    Period Weeks    Status On-going    Target Date 06/17/21      PT LONG TERM GOAL #2   Title Patient will report no increase in neck pain or tightness with cervical motion to improve household tasks    Baseline patient reports right sided neck tightness with cervical motion    Time 6    Period Weeks    Status On-going    Target Date 06/17/21      PT  LONG TERM GOAL #3  Title Patient will demonstrate >/= 4+/5 MMT of right shoulder to improve lifting overhead without pain    Baseline grossly 4/5 MMT    Time 6    Period Weeks    Status On-going    Target Date 06/17/21      PT LONG TERM GOAL #4   Title Patient will demonstrate right shoulder AROM grssly WFL and equal to opposite side to impove dressing ability    Baseline patient limited with shoulder elevation and reach behind back on right    Time 6    Period Weeks    Status On-going    Target Date 06/17/21      PT LONG TERM GOAL #5   Title Patient will demonstrate DNF endurance >/= 30 sec to improve postural control and reduce pain with activity    Baseline 24 seconds    Time 6    Period Weeks    Status On-going    Target Date 06/17/21                   Plan - 05/13/21 0856     Clinical Impression Statement Pt enters clinic with 1/10 pain in R neck/shoulder ( upper trap/ levator)  Pt consents to TPDN and has marked twitch response to Upper trap/levator. and then continues movement /and progressive overload of r shld.  Pt with muscle tightness after working out but no pain in upper trap and levator at end of session. Pt increading OH press weight to 20# but fatigues after 3 to 5 reps    Personal Factors and Comorbidities Fitness;Past/Current Experience;Time since onset of injury/illness/exacerbation    Examination-Activity Limitations Reach Overhead;Sleep;Lift;Dressing;Carry    Examination-Participation Restrictions Occupation;Community Activity;Yard Work;Cleaning    PT Frequency 1x / week    PT Duration 6 weeks    PT Treatment/Interventions ADLs/Self Care Home Management;Aquatic Therapy;Cryotherapy;Electrical Stimulation;Iontophoresis 4mg /ml Dexamethasone;Moist Heat;Traction;Ultrasound;Neuromuscular re-education;Balance training;Therapeutic exercise;Therapeutic activities;Functional mobility training;Stair training;Gait training;Patient/family education;Manual  techniques;Dry needling;Passive range of motion;Taping;Vasopneumatic Device;Spinal Manipulations;Joint Manipulations    PT Next Visit Plan Review HEP and progress PRN, manual/dry needling for right trap/levator/infra/rhomboid/pec/lat PRN, progress postural strength and endurance, right shoulder rotator cuff and periscapular strengthening    PT Home Exercise Plan [email or web address]    Consulted and Agree with Plan of Care Patient             Patient will benefit from skilled therapeutic intervention in order to improve the following deficits and impairments:  Decreased range of motion, Postural dysfunction, Decreased strength, Improper body mechanics, Decreased activity tolerance, Pain  Visit Diagnosis: Cervicalgia  Chronic right shoulder pain  Muscle weakness (generalized)  S/P right rotator cuff repair  Biceps tendonosis of right shoulder     Problem List Patient Active Problem List   Diagnosis Date Noted   Body mass index (BMI) 32.0-32.9, adult 02/19/2021   Right shoulder pain 02/19/2021   Neck pain 11/21/2020   DDD (degenerative disc disease), lumbosacral 10/20/2019   TIA (transient ischemic attack) 06/02/2018   Hypertension 94/76/5465   Complication of surgical procedure 06/03/2015   Displacement of lumbar intervertebral disc without myelopathy 08/27/2014   Lower back injury 12/07/2013    Voncille Lo, PT, [place] Certified Exercise Expert for the Aging Adult  05/13/21 9:34 AM Phone: [phone] Fax: [facility] [address] [place], [place], [address] Phone: [phone]   Fax:  [phone]  Name: Todd Ryan MRN: [id number] Date of Birth: Jul 03, 1960

## 2021-05-20 ENCOUNTER — Ambulatory Visit: Payer: Medicaid Other | Admitting: Physical Therapy

## 2021-05-27 ENCOUNTER — Other Ambulatory Visit: Payer: Self-pay

## 2021-05-27 ENCOUNTER — Encounter: Payer: Medicaid Other | Admitting: Physical Therapy

## 2021-05-27 ENCOUNTER — Ambulatory Visit: Payer: Medicaid Other | Admitting: Physical Therapy

## 2021-05-27 ENCOUNTER — Encounter: Payer: Self-pay | Admitting: Physical Therapy

## 2021-05-27 DIAGNOSIS — Z9889 Other specified postprocedural states: Secondary | ICD-10-CM

## 2021-05-27 DIAGNOSIS — M542 Cervicalgia: Secondary | ICD-10-CM

## 2021-05-27 DIAGNOSIS — M25511 Pain in right shoulder: Secondary | ICD-10-CM

## 2021-05-27 DIAGNOSIS — M67813 Other specified disorders of tendon, right shoulder: Secondary | ICD-10-CM

## 2021-05-27 DIAGNOSIS — M6281 Muscle weakness (generalized): Secondary | ICD-10-CM

## 2021-05-27 DIAGNOSIS — G8929 Other chronic pain: Secondary | ICD-10-CM

## 2021-05-27 NOTE — Therapy (Signed)
[name], [place], [address] Phone: [phone]   Fax:  [phone]  Physical Therapy Treatment/Discharge Note  Patient Details  Name: Todd Ryan MRN: [id number] Date of Birth: January 15, 1961 Referring Provider (PT): Renette Butters, MD   Encounter Date: 05/27/2021   PT End of Session - 05/27/21 0848     Visit Number 10    Number of Visits 13    Date for PT Re-Evaluation 06/17/21    Authorization Type [employer] MCD    PT Start Time 0850    PT Stop Time 0930    PT Time Calculation (min) 40 min    Activity Tolerance Patient tolerated treatment well    Behavior During Therapy [facility] for tasks assessed/performed             Past Medical History:  Diagnosis Date   Allergic rhinitis    Arthritis    shoulder, neck, back   Chronic back pain    GAD (generalized anxiety disorder)    GERD (gastroesophageal reflux disease)    History of asthma    child   Hyperlipidemia    Hypertension    MDD (major depressive disorder)    OSA on CPAP    07-15-2020  per pt uses approx. 3 times weekly   Peripheral neuropathy    Pre-diabetes    Rotator cuff tear, right    Wears glasses     Past Surgical History:  Procedure Laterality Date   COLONOSCOPY WITH PROPOFOL  05/31/2020   LUMBAR SPINE SURGERY  11/2014   SHOULDER ARTHROSCOPY WITH ROTATOR CUFF REPAIR AND SUBACROMIAL DECOMPRESSION Right 07/16/2020   Procedure: SHOULDER ARTHROSCOPY WITH ROTATOR CUFF REPAIR AND SUBACROMIAL DECOMPRESSION;  Surgeon: Renette Butters, MD;  Location: [place];  Service: Orthopedics;  Laterality: Right;    There were no vitals filed for this visit.   Subjective Assessment - 05/27/21 0856     Subjective I sleep on my shoulder sometime  I go to sleep and then I wake up but I am able to go back to sleep. I made the [place] for Thanksgiving.    Limitations House hold activities;Lifting    Currently in Pain? Yes     Pain Score 0-No pain    Pain Location Neck    Pain Orientation Right             [facility] Adult PT Treatment/Exercise:   Therapeutic Exercise: UBE L7x 4 min (2 fwd/bwd) while taking subjective Upper trap stretch 3 x 20 sec on right and 3 x 20 sec for right levator Row with blue 2 x 20 Extension + scap retraction with blue 2 x 20 Side bridge on R for 3 x 30 sec ER with blue 2 x 20 Wall clock with green tband on wall 2 minutes Push- up plus 1 x 5reps Overhead press with 20 lb x 3,  20lb x 5 Pt tend to hyperextend with shld excercises so given back/core to improve stability Trunk rotation 5 reps to R and L 20 sec hold each x 5 Bird dog 3 x 10  alternating legs/arms Supine dead but starting in tabletop 2 x 10     Between exercises performed nerve glides for Ulnar, radial and medial.  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)     Neuromuscular re-ed: N/A   Therapeutic Activity: N/A   Modalities: N/A   Self Care: N/A     not done this session Holding 15  lb KB in 90 abd/90 ER while walking   1)19 sec  2) 31 sec  3)40 sec   [facility] PT Assessment - 05/27/21 0001       Assessment   Medical Diagnosis Neck    Referring Provider (PT) Renette Butters, MD      Observation/Other Assessments   Focus on Therapeutic Outcomes (FOTO)  NA - MCD      AROM   Right Shoulder Flexion 165 Degrees    Right Shoulder ABduction 160 Degrees   compensated scaption at end range   Right Shoulder Internal Rotation --   L-3   Right Shoulder External Rotation --   t-2   Left Shoulder Flexion 170 Degrees    Left Shoulder ABduction 170 Degrees    Left Shoulder Internal Rotation --   L-3   Left Shoulder External Rotation --   reach to T2   Cervical Flexion 50    Cervical Extension 51    Cervical - Right Side Bend 35    Cervical - Left Side Bend 30    Cervical - Right Rotation 65    Cervical - Left Rotation 70      Strength   Overall Strength Comments Periscapular strength grossly 5/5  MMT  on right      Special Tests   Other special tests DNF endurance test:60 sec           added to HEP to prevent hyperextension of back with shld exercises Access Code: [email or web address]: [email or web address]: 11/29/2022Prepared by: Donnetta Simpers BeardsleyExercises   Supine Lower Trunk Rotation - 1 x daily - 7 x weekly - 3 sets - 5 reps - 20 sec hold  Bird Dog - 1 x daily - 7 x weekly - 3 sets - 10 reps  Supine Dead Bug with Leg Extension - 1 x daily - 7 x weekly - 3 sets - 10 reps                          PT Short Term Goals - 05/27/21 0859       PT SHORT TERM GOAL #1   Title Pt will be independent and compliant with initial HEP to progress with therapy    Baseline Independent    Time 3    Period Weeks    Status Achieved      PT SHORT TERM GOAL #2   Title Patient will report </= 5/10 right sided neck pain with activity to reduce functional limitation    Baseline At DC  0/10 pain 5/5 MMT    Time 3    Period Weeks    Status Achieved               PT Long Term Goals - 05/27/21 6073       PT LONG TERM GOAL #1   Title Patient will be I with final HEP to maintain progress from PT    Baseline indpendent    Time 6    Period Weeks    Status Achieved      PT LONG TERM GOAL #2   Title Patient will report no increase in neck pain or tightness with cervical motion to improve household tasks    Baseline WNL  See flowchart  no pain    Time 6    Period Weeks    Status Achieved      PT LONG TERM GOAL #3   Title Patient will demonstrate >/= 4+/5 MMT  of right shoulder to improve lifting overhead without pain    Baseline 5/5 MMT  back at work lifting heavy items    Time 6    Period Weeks    Status Achieved      PT LONG TERM GOAL #4   Title Patient will demonstrate right shoulder AROM grssly WFL and equal to opposite side to impove dressing ability    Baseline Pt WFL  See flow chart    Time 6    Period Weeks    Status Achieved      PT LONG  TERM GOAL #5   Title Patient will demonstrate DNF endurance >/= 30 sec to improve postural control and reduce pain with activity    Baseline 24 seconds at eval l 60 sec at DC Normal is 35 sec    Time 6    Period Weeks    Status Achieved                   Plan - 05/27/21 0902     Clinical Impression Statement Mr Kersten enter clinic without pain today.  Pt does complain about sleep at times but getting better.  Pt able return to work full time.  Pt is MMT 5/5 in shoulder and 0/10 pain.  Pt is now able to perform push ups and side planks with no issues in shoulders.  Pt able to carry work equipment with no issues.  Pt was complaining of slight back irritation with shoulder exercises but PT noted pt with slight hyperextension of lumbar with shoulder rows and bil extension.  Added back stability to strengthen core which pt noted helped back irritation.  Pt will be DC due to completed rehab goals and is pleased with current level of function.    Personal Factors and Comorbidities Fitness;Past/Current Experience;Time since onset of injury/illness/exacerbation    Examination-Activity Limitations Reach Overhead;Sleep;Lift;Dressing;Carry    Examination-Participation Restrictions Occupation;Community Activity;Yard Work;Cleaning    PT Frequency 1x / week    PT Duration 6 weeks    PT Treatment/Interventions ADLs/Self Care Home Management;Aquatic Therapy;Cryotherapy;Electrical Stimulation;Iontophoresis 41m/ml Dexamethasone;Moist Heat;Traction;Ultrasound;Neuromuscular re-education;Balance training;Therapeutic exercise;Therapeutic activities;Functional mobility training;Stair training;Gait training;Patient/family education;Manual techniques;Dry needling;Passive range of motion;Taping;Vasopneumatic Device;Spinal Manipulations;Joint Manipulations    PT Next Visit [id number]   Consulted and Agree with Plan of Care Patient             Patient will benefit from skilled  therapeutic intervention in order to improve the following deficits and impairments:  Decreased range of motion, Postural dysfunction, Decreased strength, Improper body mechanics, Decreased activity tolerance, Pain  Visit Diagnosis: Cervicalgia  Chronic right shoulder pain  Muscle weakness (generalized)  S/P right rotator cuff repair  Biceps tendonosis of right shoulder     Problem List Patient Active Problem List   Diagnosis Date Noted   Body mass index (BMI) 32.0-32.9, adult 02/19/2021   Right shoulder pain 02/19/2021   Neck pain 11/21/2020   DDD (degenerative disc disease), lumbosacral 10/20/2019   TIA (transient ischemic attack) 06/02/2018   Hypertension 129/79/8921  Complication of surgical procedure 06/03/2015   Displacement of lumbar intervertebral disc without myelopathy 08/27/2014   Lower back injury 12/07/2013   LVoncille Lo PT, AFergusonCertified Exercise Expert for the Aging Adult  05/27/21 1:17 PM Phone: [phone]: 3[facility] [place] [address]Phone: [phone]  Fax:  [phone] Name: PJaquavius HudlerPerson  MRN: [id number] Date of Birth: Jun 10, 1961  PHYSICAL THERAPY DISCHARGE SUMMARY  Visits from Start of Care: 10  Current functional level related to goals / functional outcomes: As above   Remaining deficits: none   Education / Equipment: HEP   Patient agrees to discharge. Patient goals were met. Patient is being discharged due to meeting the stated rehab goals. And being please with current functional level  Voncille Lo, PT, [facility] Certified Exercise Expert for the Aging Adult  05/27/21 1:17 PM Phone: [phone] Fax: [phone]

## 2021-05-27 NOTE — Patient Instructions (Signed)
      Voncille Lo, PT, [place] Certified Exercise Expert for the Aging Adult  05/27/21 9:26 AM Phone: [phone] Fax: [phone]

## 2021-06-03 ENCOUNTER — Ambulatory Visit: Payer: Medicaid Other | Admitting: Physical Therapy

## 2021-08-11 ENCOUNTER — Other Ambulatory Visit: Payer: Self-pay

## 2021-08-11 ENCOUNTER — Ambulatory Visit (INDEPENDENT_AMBULATORY_CARE_PROVIDER_SITE_OTHER): Payer: Medicaid Other | Admitting: Podiatry

## 2021-08-11 ENCOUNTER — Ambulatory Visit (INDEPENDENT_AMBULATORY_CARE_PROVIDER_SITE_OTHER): Payer: Medicaid Other

## 2021-08-11 DIAGNOSIS — M778 Other enthesopathies, not elsewhere classified: Secondary | ICD-10-CM | POA: Diagnosis not present

## 2021-08-11 NOTE — Progress Notes (Signed)
HPI: 61 y.o. male presenting today for new complaint regarding pain and tenderness to the lateral column of the right foot.  Patient states that not necessarily painful with palpation however when he steps up to walk becomes very painful.  It also hurts when he flexes the foot or stands up on his toes.  He denies a history of injury.  This is been ongoing for about 2 months now.  He has not done anything for treatment. Patient also has symptomatic calluses to the plantar aspect of the fifth toes bilateral.  Debridement of calluses helped significantly last visit.  Past Medical History:  Diagnosis Date   Allergic rhinitis    Arthritis    shoulder, neck, back   Chronic back pain    GAD (generalized anxiety disorder)    GERD (gastroesophageal reflux disease)    History of asthma    child   Hyperlipidemia    Hypertension    MDD (major depressive disorder)    OSA on CPAP    07-15-2020  per pt uses approx. 3 times weekly   Peripheral neuropathy    Pre-diabetes    Rotator cuff tear, right    Wears glasses     Past Surgical History:  Procedure Laterality Date   COLONOSCOPY WITH PROPOFOL  05/31/2020   LUMBAR SPINE SURGERY  11/2014   SHOULDER ARTHROSCOPY WITH ROTATOR CUFF REPAIR AND SUBACROMIAL DECOMPRESSION Right 07/16/2020   Procedure: SHOULDER ARTHROSCOPY WITH ROTATOR CUFF REPAIR AND SUBACROMIAL DECOMPRESSION;  Surgeon: Renette Butters, MD;  Location: [facility];  Service: Orthopedics;  Laterality: Right;    Allergies  Allergen Reactions   Shellfish Allergy Nausea And Vomiting    Mussels only, can eat oysters   Penicillins Hives    Long-term use     Physical Exam: General: The patient is alert and oriented x3 in no acute distress.  Dermatology: Skin is warm, dry and supple bilateral lower extremities. Negative for open lesions or macerations.  There continues to be some hyperkeratotic callus tissue to the plantar aspect fifth MTP joint bilateral  Vascular:  Palpable pedal pulses bilaterally. Capillary refill within normal limits.  Negative for any significant edema or erythema  Neurological: Light touch and protective threshold grossly intact  Musculoskeletal Exam: No pedal deformities noted.  There is no significant pain or tenderness to palpation along the lateral column of the right foot however there is pain when loadbearing.  Radiographic Exam:  Normal osseous mineralization. Joint spaces preserved. No fracture/dislocation/boney destruction.    Assessment: 1.  Lateral column capsulitis right foot 2.  Preulcerative callus lesions fifth MTP bilateral   Plan of Care:  1. Patient evaluated. X-Rays reviewed.  2.  Today I discussed with the patient custom molded orthotics to help support the arches of the foot and alleviate pressure from the lateral column.  The patient agrees.  Patient instructions were provided for the patient to contact his insurance company to see if they cover custom molded orthotics [id number].  If they do cover them then he will call the office for an appointment with the Pedorthist 3.  Excisional debridement of the hyperkeratotic callus tissue was performed using 312 scalpel without incident or bleeding. 4.  Return to clinic as needed      Edrick Kins, DPM [facility]  Dr. Edrick Kins, DPM    [address]  [place], [place] [address]                Office [phone]  Fax [phone]

## 2021-08-18 ENCOUNTER — Other Ambulatory Visit: Payer: Self-pay

## 2021-08-18 ENCOUNTER — Ambulatory Visit: Payer: Medicaid Other

## 2021-08-18 DIAGNOSIS — M778 Other enthesopathies, not elsewhere classified: Secondary | ICD-10-CM

## 2021-08-18 NOTE — Progress Notes (Signed)
Patient was not made aware that Medicaid is unlikely to cover foot orthotics for persons over the age of 24. Patient requested we perform a pre-certification to determine out of pocket cost just in case. [employer] to be contacted and coverage to be determined, then patient is to be contacted with this information to make an informed decision regarding his plan of care.

## 2021-10-14 ENCOUNTER — Telehealth: Payer: Self-pay

## 2021-10-14 NOTE — Telephone Encounter (Signed)
Called pt to follow up on orthotics. Informed pt that his custom orthotics would not be covered by [facility] and he would be responsible for the bill. He stated that he would try to call the insurance company back and if they tell him it will be a denied claim he will call us back to get the free inserts. I explained to the patient that the powersteps were the inserts that we have in house but they are $55 and he again stated that he would call the insurance company and have them call me back.

## 2021-12-17 ENCOUNTER — Telehealth: Payer: Self-pay | Admitting: *Deleted

## 2021-12-17 NOTE — Telephone Encounter (Signed)
Spoke with Baxter Flattery and she requested the ICD and DX codes for the patient to contact the insurance to follow up on coverage for orthotics. She stated that she had spoken to the patient and he was told that they would cover them but Baxter Flattery is calling back in for specific codes to make sure the information the patient was given is correct. Either Baxter Flattery or the pt will call back to follow up with [place].

## 2022-01-08 ENCOUNTER — Telehealth: Payer: Self-pay | Admitting: Podiatry

## 2022-01-08 NOTE — Telephone Encounter (Signed)
Pt called stating his insurance company [employer] is telling him the orthotics are covered at 100% and they were to be sending paperwork to our office with approval information.   I did explain that they could cover it but just not in our office. He is insisting they willl cover it 100%. Could you please call pt about the paperwork.

## 2022-01-16 ENCOUNTER — Other Ambulatory Visit: Payer: Self-pay | Admitting: Podiatry

## 2022-01-16 DIAGNOSIS — M778 Other enthesopathies, not elsewhere classified: Secondary | ICD-10-CM

## 2022-01-16 NOTE — Telephone Encounter (Signed)
Please refer to [facility].  Explained to the patient that we are not licensed with Medicaid.  I placed an order into the patient's chart that we can print off and he can take to [facility] lab.  Thanks, Dr. Amalia Hailey

## 2022-02-02 NOTE — Telephone Encounter (Signed)
Re-faxed rx to [facility]

## 2022-02-17 ENCOUNTER — Ambulatory Visit (HOSPITAL_COMMUNITY)
Admission: EM | Admit: 2022-02-17 | Discharge: 2022-02-17 | Disposition: A | Discharge status: Home / Self Care | Payer: Medicaid Other | Attending: Family Medicine | Admitting: Family Medicine

## 2022-02-17 ENCOUNTER — Encounter (HOSPITAL_COMMUNITY): Payer: Self-pay

## 2022-02-17 DIAGNOSIS — R600 Localized edema: Secondary | ICD-10-CM | POA: Insufficient documentation

## 2022-02-17 DIAGNOSIS — R001 Bradycardia, unspecified: Secondary | ICD-10-CM | POA: Diagnosis present

## 2022-02-17 LAB — COMPREHENSIVE METABOLIC PANEL
ALT: 18 U/L (ref 0–44)
AST: 23 U/L (ref 15–41)
Albumin: 3.6 g/dL (ref 3.5–5.0)
Alkaline Phosphatase: 64 U/L (ref 38–126)
Anion gap: 6 (ref 5–15)
BUN: 10 mg/dL (ref 6–20)
CO2: 27 mmol/L (ref 22–32)
Calcium: 9 mg/dL (ref 8.9–10.3)
Chloride: 107 mmol/L (ref 98–111)
Creatinine, Ser: 0.92 mg/dL (ref 0.61–1.24)
GFR, Estimated: >60 mL/min (ref >60)
Glucose, Bld: 107 mg/dL — ABNORMAL HIGH (ref 70–99)
Potassium: 4.1 mmol/L (ref 3.5–5.1)
Sodium: 140 mmol/L (ref 135–145)
Total Bilirubin: 0.6 mg/dL (ref 0.3–1.2)
Total Protein: 7.5 g/dL (ref 6.5–8.1)

## 2022-02-17 LAB — CBC
HCT: 39.6 % (ref 39.0–52.0)
Hemoglobin: 13.3 g/dL (ref 13.0–17.0)
MCH: 30.8 pg (ref 26.0–34.0)
MCHC: 33.6 g/dL (ref 30.0–36.0)
MCV: 91.7 fL (ref 80.0–100.0)
Platelets: 222 10*3/uL (ref 150–400)
RBC: 4.32 MIL/uL (ref 4.22–5.81)
RDW: 14.4 % (ref 11.5–15.5)
WBC: 5.4 10*3/uL (ref 4.0–10.5)
nRBC: 0 % (ref 0.0–0.2)

## 2022-02-17 LAB — BRAIN NATRIURETIC PEPTIDE: B Natriuretic Peptide: 53.8 pg/mL (ref 0.0–100.0)

## 2022-02-17 MED ORDER — FUROSEMIDE 20 MG PO TABS: 20.0000 mg | ORAL_TABLET | Freq: Every day | ORAL | 0 refills | Status: DC

## 2022-02-17 NOTE — Discharge Instructions (Addendum)
You have had labs (blood work) drawn today. We will call you with any significant abnormalities or if there is need to begin or change treatment or pursue further follow up.  You may also review your test results online through [place]. If you do not have a MyChart account, instructions to sign up should be on your discharge paperwork.  Swelling happens when fluid collects in small spaces around tissues and organs inside the body. Another word for swelling is "edema." Some common parts of the body where people can have swelling are the lower legs or hands. This typically is worse in the areas of the body that are closest to the ground (because of gravity)  Symptoms of swelling can include puffiness of the skin, which can cause the skin to look stretched and shiny. This often occurs with swelling in the lower legs and can be worse after you sit or stand for a long time.  Treatment of edema includes several components: treatment of the underlying cause (if possible), reducing the amount of salt (sodium) in your diet, and, in many cases, use of a medication called a diuretic to eliminate excess fluid. Using compression stockings and elevating the legs may also be recommended.

## 2022-02-17 NOTE — ED Triage Notes (Signed)
Pt is her for a possible insect bite on right ankle x1wk ago , since pt has swelling and pain at times

## 2022-02-18 NOTE — ED Provider Notes (Signed)
[facility]   [id number] 02/17/22 Arrival Time: 0932  ASSESSMENT & PLAN:  1. Lower extremity edema   2. Bradycardia    Unclear etiology. No s/s of DVT.  No significant lab abn to suggest etiology of swelling. Is possibly related to insect bite suffered a few d ago. No signs of infection.  Labs Reviewed  COMPREHENSIVE METABOLIC PANEL - Abnormal; Notable for the following components:      Result Value   Glucose, Bld 107 (*)    All other components within normal limits  CBC  BRAIN NATRIURETIC PEPTIDE   Begin: Discharge Medication List as of 02/17/2022 10:46 AM     START taking these medications   Details  furosemide (LASIX) 20 MG tablet Take 1 tablet (20 mg total) by mouth daily., Starting Tue 02/17/2022, Normal       Initial bradycardia; "felt a flutter in my chest"; now resolved. HR in the 60s. ECG with PAC. If he feels further symptoms recommend cardiology eval. Copy of ECG given to him.   Follow-up Information     Schedule an appointment as soon as possible for a visit  with Nolene Ebbs, MD.   Specialty: Internal Medicine Contact information: [address] [place] [address] [phone]                 Reviewed expectations re: course of current medical issues. Questions answered. Outlined signs and symptoms indicating need for more acute intervention. Understanding verbalized. After Visit Summary given.   SUBJECTIVE: History from: Patient. Todd Ryan is a 61 y.o. male. Reports: "insect bite" to R ankle; few days ago; now with ankle swelling; some pain. No redness. Afebrile. Normal ambulation. No CP/SOB. Normal PO intake without n/v/d.  OBJECTIVE:  Vitals:   02/17/22 1002 02/17/22 1008  BP: 120/70   Pulse: (!) 38   Resp: 12   Temp: 97.8 F (36.6 C)   TempSrc: Oral   SpO2: 98%   Weight:  114.3 kg  Height:  '6\' 2"'$  (1.88 m)    Bradycardia noted. No significant sympotms.  General appearance: alert; no distress Eyes:  PERRLA; EOMI; conjunctiva normal HENT: [place]; AT; without nasal congestion Neck: supple  Lungs: speaks full sentences without difficulty; unlabored Extremities: 1+ edema of R ankle; no calf swelling or pain Skin: warm and dry Neurologic: normal gait Psychological: alert and cooperative; normal mood and affect  Labs: Results for orders placed or performed during the hospital encounter of 02/17/22  CBC  Result Value Ref Range   WBC 5.4 4.0 - 10.5 K/uL   RBC 4.32 4.22 - 5.81 MIL/uL   Hemoglobin 13.3 13.0 - 17.0 g/dL   HCT 39.6 39.0 - 52.0 %   MCV 91.7 80.0 - 100.0 fL   MCH 30.8 26.0 - 34.0 pg   MCHC 33.6 30.0 - 36.0 g/dL   RDW 14.4 11.5 - 15.5 %   Platelets 222 150 - 400 K/uL   nRBC 0.0 0.0 - 0.2 %  Comprehensive metabolic panel  Result Value Ref Range   Sodium 140 135 - 145 mmol/L   Potassium 4.1 3.5 - 5.1 mmol/L   Chloride 107 98 - 111 mmol/L   CO2 27 22 - 32 mmol/L   Glucose, Bld 107 (H) 70 - 99 mg/dL   BUN 10 6 - 20 mg/dL   Creatinine, Ser 0.92 0.61 - 1.24 mg/dL   Calcium 9.0 8.9 - 10.3 mg/dL   Total Protein 7.5 6.5 - 8.1 g/dL   Albumin 3.6 3.5 -  5.0 g/dL   AST 23 15 - 41 U/L   ALT 18 0 - 44 U/L   Alkaline Phosphatase 64 38 - 126 U/L   Total Bilirubin 0.6 0.3 - 1.2 mg/dL   GFR, Estimated >60 >60 mL/min   Anion gap 6 5 - 15  Brain natriuretic peptide  Result Value Ref Range   B Natriuretic Peptide 53.8 0.0 - 100.0 pg/mL   Labs Reviewed  COMPREHENSIVE METABOLIC PANEL - Abnormal; Notable for the following components:      Result Value   Glucose, Bld 107 (*)    All other components within normal limits  CBC  BRAIN NATRIURETIC PEPTIDE     Allergies  Allergen Reactions   Shellfish Allergy Nausea And Vomiting    Mussels only, can eat oysters    Past Medical History:  Diagnosis Date   Allergic rhinitis    Arthritis    shoulder, neck, back   Chronic back pain    GAD (generalized anxiety disorder)    GERD (gastroesophageal reflux disease)    History of asthma     child   Hyperlipidemia    Hypertension    MDD (major depressive disorder)    OSA on CPAP    07-15-2020  per pt uses approx. 3 times weekly   Peripheral neuropathy    Pre-diabetes    Rotator cuff tear, right    Wears glasses    Social History   Socioeconomic History   Marital status: Single    Spouse name: Not on file   Number of children: Not on file   Years of education: Not on file   Highest education level: Not on file  Occupational History   Not on file  Tobacco Use   Smoking status: Some Days    Types: Cigarettes   Smokeless tobacco: Never   Tobacco comments:    07-15-2020 per pt 1 ppmonth  Vaping Use   Vaping Use: Never used  Substance and Sexual Activity   Alcohol use: Not Currently    Alcohol/week: 0.0 standard drinks of alcohol    Comment: occas   Drug use: Never   Sexual activity: Yes  Other Topics Concern   Not on file  Social History Narrative   Not on file   Social Determinants of Health   Financial Resource Strain: Not on file  Food Insecurity: Not on file  Transportation Needs: Not on file  Physical Activity: Not on file  Stress: Not on file  Social Connections: Not on file  Intimate Partner Violence: Not on file   Family History  Problem Relation Age of Onset   Hypertension Mother    CAD Father    Colon cancer Neg Hx    Esophageal cancer Neg Hx    Rectal cancer Neg Hx    Stomach cancer Neg Hx    Past Surgical History:  Procedure Laterality Date   COLONOSCOPY WITH PROPOFOL  05/31/2020   LUMBAR SPINE SURGERY  11/2014   SHOULDER ARTHROSCOPY WITH ROTATOR CUFF REPAIR AND SUBACROMIAL DECOMPRESSION Right 07/16/2020   Procedure: SHOULDER ARTHROSCOPY WITH ROTATOR CUFF REPAIR AND SUBACROMIAL DECOMPRESSION;  Surgeon: Renette Butters, MD;  Location: [facility];  Service: Orthopedics;  Laterality: Right;     Vanessa Kick, MD 02/18/22 [phone]

## 2022-03-27 ENCOUNTER — Other Ambulatory Visit: Payer: Self-pay | Admitting: Internal Medicine

## 2022-03-28 LAB — COMPLETE METABOLIC PANEL WITH GFR
AG Ratio: 1.1 (calc) (ref 1.0–2.5)
ALT: 14 U/L (ref 9–46)
AST: 18 U/L (ref 10–35)
Albumin: 4.3 g/dL (ref 3.6–5.1)
Alkaline phosphatase (APISO): 72 U/L (ref 35–144)
BUN: 13 mg/dL (ref 7–25)
CO2: 23 mmol/L (ref 20–32)
Calcium: 9.2 mg/dL (ref 8.6–10.3)
Chloride: 104 mmol/L (ref 98–110)
Creat: 0.92 mg/dL (ref 0.70–1.30)
Globulin: 3.9 g/dL (calc) — ABNORMAL HIGH (ref 1.9–3.7)
Glucose, Bld: 105 mg/dL — ABNORMAL HIGH (ref 65–99)
Potassium: 4.2 mmol/L (ref 3.5–5.3)
Sodium: 137 mmol/L (ref 135–146)
Total Bilirubin: 0.6 mg/dL (ref 0.2–1.2)
Total Protein: 8.2 g/dL — ABNORMAL HIGH (ref 6.1–8.1)
eGFR: 96 mL/min/{1.73_m2} (ref >=60)

## 2022-03-28 LAB — PSA: PSA: 0.6 ng/mL (ref <=4.00)

## 2022-03-28 LAB — LIPID PANEL
Cholesterol: 138 mg/dL (ref <200)
HDL: 50 mg/dL (ref >=40)
LDL Cholesterol (Calc): 71 mg/dL (calc)
Non-HDL Cholesterol (Calc): 88 mg/dL (calc) (ref <130)
Total CHOL/HDL Ratio: 2.8 (calc) (ref <5.0)
Triglycerides: 86 mg/dL (ref <150)

## 2022-03-28 LAB — CBC
HCT: 41.5 % (ref 38.5–50.0)
Hemoglobin: 13.8 g/dL (ref 13.2–17.1)
MCH: 30.9 pg (ref 27.0–33.0)
MCHC: 33.3 g/dL (ref 32.0–36.0)
MCV: 93 fL (ref 80.0–100.0)
MPV: 10.9 fL (ref 7.5–12.5)
Platelets: 246 10*3/uL (ref 140–400)
RBC: 4.46 10*6/uL (ref 4.20–5.80)
RDW: 14 % (ref 11.0–15.0)
WBC: 7.2 10*3/uL (ref 3.8–10.8)

## 2022-03-28 LAB — VITAMIN D 25 HYDROXY (VIT D DEFICIENCY, FRACTURES): Vit D, 25-Hydroxy: 66 ng/mL (ref 30–100)

## 2022-03-28 LAB — TSH: TSH: 2.43 mIU/L (ref 0.40–4.50)

## 2022-04-22 NOTE — H&P (Signed)
PREOPERATIVE H&P  Chief Complaint: RIGHT SHOULDER ROTATOR CUFF TEAR  HPI: Todd Ryan is a 61 y.o. male who presents with a diagnosis of RIGHT SHOULDER [place]. Symptoms are rated as moderate to severe, and have been worsening.  This is significantly impairing activities of daily living.  He has elected for surgical management.   Past Medical History:  Diagnosis Date   Allergic rhinitis    Arthritis    shoulder, neck, back   Chronic back pain    GAD (generalized anxiety disorder)    GERD (gastroesophageal reflux disease)    History of asthma    child   Hyperlipidemia    Hypertension    MDD (major depressive disorder)    OSA on CPAP    07-15-2020  per pt uses approx. 3 times weekly   Peripheral neuropathy    Pre-diabetes    Rotator cuff tear, right    Wears glasses    Past Surgical History:  Procedure Laterality Date   COLONOSCOPY WITH PROPOFOL  05/31/2020   LUMBAR SPINE SURGERY  11/2014   SHOULDER ARTHROSCOPY WITH ROTATOR CUFF REPAIR AND SUBACROMIAL DECOMPRESSION Right 07/16/2020   Procedure: SHOULDER ARTHROSCOPY WITH ROTATOR CUFF REPAIR AND SUBACROMIAL DECOMPRESSION;  Surgeon: Renette Butters, MD;  Location: [facility];  Service: Orthopedics;  Laterality: Right;   Social History   Socioeconomic History   Marital status: Single    Spouse name: Not on file   Number of children: Not on file   Years of education: Not on file   Highest education level: Not on file  Occupational History   Not on file  Tobacco Use   Smoking status: Some Days    Types: Cigarettes   Smokeless tobacco: Never   Tobacco comments:    07-15-2020 per pt 1 ppmonth  Vaping Use   Vaping Use: Never used  Substance and Sexual Activity   Alcohol use: Not Currently    Alcohol/week: 0.0 standard drinks of alcohol    Comment: occas   Drug use: Never   Sexual activity: Yes  Other Topics Concern   Not on file  Social History Narrative   Not on file   Social  Determinants of Health   Financial Resource Strain: Not on file  Food Insecurity: Not on file  Transportation Needs: Not on file  Physical Activity: Not on file  Stress: Not on file  Social Connections: Not on file   Family History  Problem Relation Age of Onset   Hypertension Mother    CAD Father    Colon cancer Neg Hx    Esophageal cancer Neg Hx    Rectal cancer Neg Hx    Stomach cancer Neg Hx    Allergies  Allergen Reactions   Shellfish Allergy Nausea And Vomiting    [place] only, can eat oysters   Prior to Admission medications   Medication Sig Start Date End Date Taking? Authorizing Provider  amLODipine (NORVASC) 10 MG tablet Take 1 tablet (10 mg total) by mouth daily. For blood pressure 07/05/19   Fulp, Cammie, MD  atorvastatin (LIPITOR) 80 MG tablet Take 1 tablet (80 mg total) by mouth daily. To lower cholesterol 07/08/18   Fulp, Cammie, MD  busPIRone (BUSPAR) 10 MG tablet Take 1 tablet (10 mg total) by mouth 3 (three) times daily. To reduce anxiety 08/22/18   Fulp, Cammie, MD  calcium carbonate (TUMS - DOSED IN MG ELEMENTAL CALCIUM) 500 MG chewable tablet Chew 1 tablet by mouth as needed  for indigestion or heartburn.    [provider]  cetirizine (ZYRTEC) 10 MG tablet Take 1/2 to one pill once per day at bedtime as needed for nasal congestion 08/22/18   Fulp, Cammie, MD  ciprofloxacin (CIPRO) 500 MG tablet SMARTSIG:1 Tablet(s) By Mouth Every 12 Hours 03/28/21   [provider]  Docusate Calcium (STOOL SOFTENER PO) Take by mouth as needed. Pt states 1-2 times a month    [provider]  esomeprazole (NEXIUM) 20 MG capsule Take 1 capsule (20 mg total) by mouth daily at 12 noon. 07/08/18   Fulp, Cammie, MD  fluticasone (FLONASE) 50 MCG/ACT nasal spray Place 2 sprays into both nostrils daily. Patient taking differently: Place 2 sprays into both nostrils daily as needed. 07/17/19   Fulp, Cammie, MD  furosemide (LASIX) 20 MG tablet Take 1 tablet (20 mg total)  by mouth daily. 02/17/22   Vanessa Kick, MD  magnesium oxide (MAG-OX) 400 MG tablet Take 400 mg by mouth daily.    [provider]  meloxicam (MOBIC) 15 MG tablet Take 15 mg by mouth daily.    [provider]  oxyCODONE-acetaminophen (PERCOCET/ROXICET) 5-325 MG tablet Take 1 tablet by mouth every 6 (six) hours as needed. 04/11/21   [provider]  sertraline (ZOLOFT) 100 MG tablet Take 1 tablet (100 mg total) by mouth daily. 08/22/18   Fulp, Cammie, MD  sildenafil (VIAGRA) 100 MG tablet Take 100 mg by mouth daily as needed. 01/17/21   [provider]  sildenafil (VIAGRA) 100 MG tablet  04/24/20   [provider]  tamsulosin (FLOMAX) 0.4 MG CAPS capsule Take 0.4 mg by mouth daily. 04/27/21   [provider]  tiZANidine (ZANAFLEX) 4 MG tablet  06/20/20   [provider]  Vitamin D, Ergocalciferol, (DRISDOL) 1.25 MG (50000 UNIT) CAPS capsule Take 50,000 Units by mouth once a week. 04/26/21   [provider]     Positive ROS: All other systems have been reviewed and were otherwise negative with the exception of those mentioned in the HPI and as above.  Physical Exam: General: Alert, no acute distress Cardiovascular: No pedal edema Respiratory: No cyanosis, no use of accessory musculature GI: No organomegaly, abdomen is soft and non-tender Skin: No lesions in the area of chief complaint Neurologic: Sensation intact distally Psychiatric: Patient is competent for consent with normal mood and affect Lymphatic: No axillary or cervical lymphadenopathy  MUSCULOSKELETAL: TTP right shoulder, full ROM but pain with resisted ROM, decreased strength, + O'Brien's test, NVI     Imaging: MRI shows high grade articular sided tear of the supraspinatus tendon and tiny interstitial tear of the infraspinatus tendon   Assessment: RIGHT SHOULDER ROTATOR CUFF TEAR  Plan: Plan for Procedure(s): RIGHT SHOULDER ARTHROSCOPY WITH ROTATOR  CUFF REPAIR AND SUBACROMIAL DECOMPRESSION  The risks benefits and alternatives were discussed with the patient including but not limited to the risks of nonoperative treatment, versus surgical intervention including infection, bleeding, nerve injury,  blood clots, cardiopulmonary complications, morbidity, mortality, among others, and they were willing to proceed.   Weightbearing: NWB RUE Orthopedic devices: sling Showering: POD 3 Dressing: reinforce PRN Medicines: takes Percocet 5-325 q6h; ASA, Oxy '5mg'$  bid, Tylenol, Mobic, Baclofen, Zofran  Discharge: home Follow up: 05/15/22 at [facility] [place] Office [phone] 04/22/2022 12:35 PM

## 2022-04-30 ENCOUNTER — Encounter (HOSPITAL_BASED_OUTPATIENT_CLINIC_OR_DEPARTMENT_OTHER): Payer: Self-pay | Admitting: Orthopedic Surgery

## 2022-04-30 NOTE — Progress Notes (Signed)
Spoke w/ via phone for pre-op interview--- [place]----   ISTAT and EKG            Lab results------ COVID test -----patient states asymptomatic no test needed Arrive at -------0630 NPO after MN NO Solid Food.  Med rec completed Medications to take morning of surgery ----- Albuterol-bring, Norvasc, Nexium and Buspar Diabetic medication ----- Patient instructed no nail polish to be worn day of surgery Patient instructed to bring photo id and insurance card day of surgery Patient aware to have Driver (ride ) / caregiver  [name]   for 24 hours after surgery  Patient Special Instructions ----- Pre-Op special Istructions ----- Patient verbalized understanding of instructions that were given at this phone interview. Patient denies shortness of breath, chest pain, fever, cough at this phone interview.

## 2022-05-04 NOTE — Discharge Instructions (Addendum)
No acetaminophen/Tylenol until after 1:00pm today if needed for pain.      Maintain sling until follow up.  Diet: As you were doing prior to hospitalization   Dressing:  Keep dressings on and dry. You may remove dressings in 3 days and shower over incisions.  No Bath / submerging incisions.  Cover incisions with clean Band-Aids after shower. Do not remove sutures. A large amount of fluid was pumped into your shoulder during surgery to help with visualization. It is normal for shoulder and arm swelling and bruising post-op. Some of this fluid may also leak out of the incisions and onto the dressings.   Activity:  Increase activity slowly as tolerated, but follow the weight bearing instructions below.  The rules on driving is that you can not be taking narcotics while you drive, and you must feel in control of the vehicle.    Weight Bearing:  Do not lift or bear weight with affected arm.  You may straighten and bend arm at the elbow.  Medicines:  - Tylenol is for mild and moderate pain relief. - Meloxicam is for pain and inflammation. - Aspirin to help prevent blood clots after surgery - Oxycodone is a narcotic for severe breakthrough pain relief. Take this as little as possible and stop it as soon as possible.   - take only as needed for severe pain not controlled by daily pain prescription - Baclofen is for muscle spasms. This medicine can make you drowsy. - Zofran is for nausea and vomiting.    Constipation: Narcotic pain medications cause constipation.  Reduce use or stop taking if you become constipated.  Drink plenty of fluids (prune juice may be helpful) and high fiber foods.  You may use a stool softener such as -  Colace (over the counter) 100 mg by mouth twice a day  And/or Miralax (over the counter) for constipation as needed.    Itching:  If you experience itching with your medications, try taking only a single pain pill, or even half a pain pill at a time.  You can also use  benadryl over the counter for itching or also to help with sleep.   Precautions:  If you experience chest pain or shortness of breath - call 911 immediately for transfer to the hospital emergency department!!  If you develop a fever greater that 101 F, purulent drainage from wound, increased redness or drainage from wound, or calf pain -- Call the office at [facility]  1. Numbness or the inability to move the "blocked" extremity may last from 3-48 hours after placement. The length of time depends on the medication injected and your individual response to the medication. If the numbness is not going away after 48 hours, call your surgeon.  2. The extremity that is blocked will need to be protected until the numbness is gone and the  Strength has returned. Because you cannot feel it, you will need to take extra care to avoid injury. Because it may be weak, you may have difficulty moving it or using it. You may not know what position it is in without looking at it while the block is in effect.  3.  For blocks in the legs and feet, returning to weight bearing and walking needs to be done carefully. You will need to wait until the numbness is entirely gone and the strength has returned. You should be able to move your leg and foot normally before you try and bear weight or walk. You will need someone to be with you when you first try to ensure you do not fall and possibly risk injury.  4. Bruising and tenderness at the needle site are common side effects and will resolve in a few days.  5. Persistent numbness or new problems with movement should be communicated to the surgeon or the [facility] [phone] [place] [phone]).    Post Anesthesia Home Care Instructions  Activity: Get plenty of rest for the remainder of the day. A responsible individual must stay with you for 24  hours following the procedure.  For the next 24 hours, DO NOT: -Drive a car -Paediatric nurse -Drink alcoholic beverages -Take any medication unless instructed by your physician -Make any legal decisions or sign important papers.  Meals: Start with liquid foods such as gelatin or soup. Progress to regular foods as tolerated. Avoid greasy, spicy, heavy foods. If nausea and/or vomiting occur, drink only clear liquids until the nausea and/or vomiting subsides. Call your physician if vomiting continues.  Special Instructions/Symptoms: Your throat may feel dry or sore from the anesthesia or the breathing tube placed in your throat during surgery. If this causes discomfort, gargle with warm salt water. The discomfort should disappear within 24 hours.

## 2022-05-04 NOTE — Anesthesia Preprocedure Evaluation (Signed)
Anesthesia Evaluation  Patient identified by MRN, date of birth, ID band Patient awake    Reviewed: Allergy & Precautions, NPO status , Patient's Chart, lab work & pertinent test results  History of Anesthesia Complications Negative for: history of anesthetic complications  Airway Mallampati: III  TM Distance: >3 FB Neck ROM: Full    Dental  (+) Partial Lower, Partial Upper, Missing   Pulmonary asthma , sleep apnea and Continuous Positive Airway Pressure Ventilation , Current Smoker   Pulmonary exam normal        Cardiovascular hypertension, Pt. on medications Normal cardiovascular exam     Neuro/Psych   Anxiety Depression    TIA   GI/Hepatic Neg liver ROS,GERD  Medicated and Controlled,,  Endo/Other  negative endocrine ROS    Renal/GU negative Renal ROS  negative genitourinary   Musculoskeletal  (+) Arthritis ,  RIGHT SHOULDER ROTATOR CUFF TEAR   Abdominal   Peds  Hematology negative hematology ROS (+)   Anesthesia Other Findings Day of surgery medications reviewed with patient.  Reproductive/Obstetrics negative OB ROS                             Anesthesia Physical Anesthesia Plan  ASA: 2  Anesthesia Plan: General   Post-op Pain Management: Tylenol PO (pre-op)* and Regional block*   Induction: Intravenous  PONV Risk Score and Plan: 2 and Treatment may vary due to age or medical condition, Ondansetron, Dexamethasone and Midazolam  Airway Management Planned: Oral ETT  Additional Equipment: None  Intra-op Plan:   Post-operative Plan: Extubation in OR  Informed Consent: I have reviewed the patients History and Physical, chart, labs and discussed the procedure including the risks, benefits and alternatives for the proposed anesthesia with the patient or authorized representative who has indicated his/her understanding and acceptance.     Dental advisory given  Plan  Discussed with: CRNA  Anesthesia Plan Comments:        Anesthesia Quick Evaluation

## 2022-05-05 ENCOUNTER — Other Ambulatory Visit: Payer: Self-pay

## 2022-05-05 ENCOUNTER — Encounter (HOSPITAL_BASED_OUTPATIENT_CLINIC_OR_DEPARTMENT_OTHER)
Admission: RE | Disposition: A | Discharge status: Home / Self Care | Payer: Self-pay | Source: Home / Self Care | Attending: Orthopedic Surgery

## 2022-05-05 ENCOUNTER — Ambulatory Visit (HOSPITAL_BASED_OUTPATIENT_CLINIC_OR_DEPARTMENT_OTHER)
Admission: RE | Admit: 2022-05-05 | Discharge: 2022-05-05 | Disposition: A | Discharge status: Home / Self Care | Payer: Medicaid Other | Attending: Orthopedic Surgery | Admitting: Orthopedic Surgery

## 2022-05-05 ENCOUNTER — Ambulatory Visit (HOSPITAL_BASED_OUTPATIENT_CLINIC_OR_DEPARTMENT_OTHER): Payer: Medicaid Other | Admitting: Anesthesiology

## 2022-05-05 ENCOUNTER — Encounter (HOSPITAL_BASED_OUTPATIENT_CLINIC_OR_DEPARTMENT_OTHER): Payer: Self-pay | Admitting: Orthopedic Surgery

## 2022-05-05 DIAGNOSIS — X58XXXA Exposure to other specified factors, initial encounter: Secondary | ICD-10-CM | POA: Insufficient documentation

## 2022-05-05 DIAGNOSIS — M25811 Other specified joint disorders, right shoulder: Secondary | ICD-10-CM | POA: Insufficient documentation

## 2022-05-05 DIAGNOSIS — G4733 Obstructive sleep apnea (adult) (pediatric): Secondary | ICD-10-CM | POA: Insufficient documentation

## 2022-05-05 DIAGNOSIS — I10 Essential (primary) hypertension: Secondary | ICD-10-CM | POA: Diagnosis not present

## 2022-05-05 DIAGNOSIS — R7303 Prediabetes: Secondary | ICD-10-CM

## 2022-05-05 DIAGNOSIS — S46011A Strain of muscle(s) and tendon(s) of the rotator cuff of right shoulder, initial encounter: Secondary | ICD-10-CM | POA: Diagnosis present

## 2022-05-05 DIAGNOSIS — F418 Other specified anxiety disorders: Secondary | ICD-10-CM | POA: Diagnosis not present

## 2022-05-05 DIAGNOSIS — M7541 Impingement syndrome of right shoulder: Secondary | ICD-10-CM | POA: Diagnosis not present

## 2022-05-05 DIAGNOSIS — M19011 Primary osteoarthritis, right shoulder: Secondary | ICD-10-CM | POA: Insufficient documentation

## 2022-05-05 DIAGNOSIS — M24011 Loose body in right shoulder: Secondary | ICD-10-CM | POA: Insufficient documentation

## 2022-05-05 DIAGNOSIS — M75101 Unspecified rotator cuff tear or rupture of right shoulder, not specified as traumatic: Secondary | ICD-10-CM | POA: Diagnosis not present

## 2022-05-05 DIAGNOSIS — F17210 Nicotine dependence, cigarettes, uncomplicated: Secondary | ICD-10-CM | POA: Insufficient documentation

## 2022-05-05 DIAGNOSIS — K219 Gastro-esophageal reflux disease without esophagitis: Secondary | ICD-10-CM | POA: Insufficient documentation

## 2022-05-05 HISTORY — PX: SHOULDER ARTHROSCOPY WITH ROTATOR CUFF REPAIR AND SUBACROMIAL DECOMPRESSION: SHX5686

## 2022-05-05 LAB — POCT I-STAT, CHEM 8
BUN: 19 mg/dL (ref 6–20)
Calcium, Ion: 1.13 mmol/L — ABNORMAL LOW (ref 1.15–1.40)
Chloride: 104 mmol/L (ref 98–111)
Creatinine, Ser: 0.9 mg/dL (ref 0.61–1.24)
Glucose, Bld: 128 mg/dL — ABNORMAL HIGH (ref 70–99)
HCT: 47 % (ref 39.0–52.0)
Hemoglobin: 16 g/dL (ref 13.0–17.0)
Potassium: 4 mmol/L (ref 3.5–5.1)
Sodium: 140 mmol/L (ref 135–145)
TCO2: 23 mmol/L (ref 22–32)

## 2022-05-05 SURGERY — SHOULDER ARTHROSCOPY WITH ROTATOR CUFF REPAIR AND SUBACROMIAL DECOMPRESSION
Anesthesia: General | Site: Shoulder | Laterality: Right

## 2022-05-05 MED ORDER — ACETAMINOPHEN ER 650 MG PO TBCR: 650.0000 mg | EXTENDED_RELEASE_TABLET | Freq: Three times a day (TID) | ORAL | 0 refills | Status: DC | PRN

## 2022-05-05 MED ORDER — MIDAZOLAM HCL 2 MG/2ML IJ SOLN
INTRAMUSCULAR | Status: AC
  Filled 2022-05-05: qty 2

## 2022-05-05 MED ORDER — OXYCODONE HCL 5 MG/5ML PO SOLN: 5.0000 mg | Freq: Once | ORAL | Status: DC | PRN

## 2022-05-05 MED ORDER — DEXAMETHASONE SODIUM PHOSPHATE 10 MG/ML IJ SOLN: 8.0000 mg | Freq: Once | INTRAMUSCULAR | Status: DC

## 2022-05-05 MED ORDER — PHENYLEPHRINE HCL (PRESSORS) 10 MG/ML IV SOLN
INTRAVENOUS | Status: AC
  Filled 2022-05-05: qty 1

## 2022-05-05 MED ORDER — PROPOFOL 10 MG/ML IV BOLUS
INTRAVENOUS | Status: AC
  Filled 2022-05-05: qty 20

## 2022-05-05 MED ORDER — ROCURONIUM BROMIDE 10 MG/ML (PF) SYRINGE
PREFILLED_SYRINGE | INTRAVENOUS | Status: DC | PRN
  Administered 2022-05-05: 60 mg via INTRAVENOUS

## 2022-05-05 MED ORDER — FENTANYL CITRATE (PF) 100 MCG/2ML IJ SOLN
INTRAMUSCULAR | Status: AC
  Filled 2022-05-05: qty 2

## 2022-05-05 MED ORDER — ROCURONIUM BROMIDE 10 MG/ML (PF) SYRINGE
PREFILLED_SYRINGE | INTRAVENOUS | Status: AC
  Filled 2022-05-05: qty 10

## 2022-05-05 MED ORDER — OXYCODONE HCL 5 MG PO TABS: ORAL_TABLET | ORAL | 0 refills | Status: AC

## 2022-05-05 MED ORDER — DEXAMETHASONE SODIUM PHOSPHATE 10 MG/ML IJ SOLN
INTRAMUSCULAR | Status: AC
  Filled 2022-05-05: qty 1

## 2022-05-05 MED ORDER — MELOXICAM 15 MG PO TABS: 15.0000 mg | ORAL_TABLET | Freq: Every day | ORAL | 0 refills | Status: DC

## 2022-05-05 MED ORDER — PROPOFOL 10 MG/ML IV BOLUS
INTRAVENOUS | Status: DC | PRN
  Administered 2022-05-05: 170 mg via INTRAVENOUS
  Administered 2022-05-05: 30 mg via INTRAVENOUS

## 2022-05-05 MED ORDER — FENTANYL CITRATE (PF) 100 MCG/2ML IJ SOLN: 25.0000 ug | INTRAMUSCULAR | Status: DC | PRN

## 2022-05-05 MED ORDER — SODIUM CHLORIDE 0.9 % IR SOLN
Status: DC | PRN
  Administered 2022-05-05: 3000 mL
  Administered 2022-05-05: 6000 mL

## 2022-05-05 MED ORDER — FENTANYL CITRATE (PF) 100 MCG/2ML IJ SOLN
50.0000 ug | Freq: Once | INTRAMUSCULAR | Status: AC
  Administered 2022-05-05: 50 ug via INTRAVENOUS

## 2022-05-05 MED ORDER — AMISULPRIDE (ANTIEMETIC) 5 MG/2ML IV SOLN: 10.0000 mg | Freq: Once | INTRAVENOUS | Status: DC | PRN

## 2022-05-05 MED ORDER — ONDANSETRON HCL 4 MG/2ML IJ SOLN
INTRAMUSCULAR | Status: DC | PRN
  Administered 2022-05-05: 4 mg via INTRAVENOUS

## 2022-05-05 MED ORDER — ASPIRIN 81 MG PO CHEW: 81.0000 mg | CHEWABLE_TABLET | Freq: Two times a day (BID) | ORAL | 0 refills | Status: AC

## 2022-05-05 MED ORDER — 0.9 % SODIUM CHLORIDE (POUR BTL) OPTIME
TOPICAL | Status: DC | PRN
  Administered 2022-05-05: 1000 mL

## 2022-05-05 MED ORDER — OXYCODONE HCL 5 MG PO TABS: 5.0000 mg | ORAL_TABLET | Freq: Once | ORAL | Status: DC | PRN

## 2022-05-05 MED ORDER — LIDOCAINE 2% (20 MG/ML) 5 ML SYRINGE
INTRAMUSCULAR | Status: DC | PRN
  Administered 2022-05-05: 100 mg via INTRAVENOUS

## 2022-05-05 MED ORDER — SUGAMMADEX SODIUM 200 MG/2ML IV SOLN
INTRAVENOUS | Status: DC | PRN
  Administered 2022-05-05: 250 mg via INTRAVENOUS

## 2022-05-05 MED ORDER — ACETAMINOPHEN 500 MG PO TABS
1000.0000 mg | ORAL_TABLET | Freq: Once | ORAL | Status: AC
  Administered 2022-05-05: 1000 mg via ORAL

## 2022-05-05 MED ORDER — PHENYLEPHRINE HCL-NACL 20-0.9 MG/250ML-% IV SOLN
INTRAVENOUS | Status: DC | PRN
  Administered 2022-05-05: 40 ug/min via INTRAVENOUS

## 2022-05-05 MED ORDER — BUPIVACAINE LIPOSOME 1.3 % IJ SUSP
INTRAMUSCULAR | Status: DC | PRN
  Administered 2022-05-05: 10 mL via PERINEURAL

## 2022-05-05 MED ORDER — MIDAZOLAM HCL 2 MG/2ML IJ SOLN
1.0000 mg | Freq: Once | INTRAMUSCULAR | Status: AC
  Administered 2022-05-05: 1 mg via INTRAVENOUS

## 2022-05-05 MED ORDER — DEXAMETHASONE SODIUM PHOSPHATE 10 MG/ML IJ SOLN
INTRAMUSCULAR | Status: DC | PRN
  Administered 2022-05-05 (×2): 4 mg via INTRAVENOUS

## 2022-05-05 MED ORDER — POVIDONE-IODINE 10 % EX SWAB: 2.0000 | Freq: Once | CUTANEOUS | Status: DC

## 2022-05-05 MED ORDER — ONDANSETRON HCL 4 MG PO TABS: 4.0000 mg | ORAL_TABLET | Freq: Three times a day (TID) | ORAL | 0 refills | Status: AC | PRN

## 2022-05-05 MED ORDER — BUPIVACAINE-EPINEPHRINE (PF) 0.5% -1:200000 IJ SOLN
INTRAMUSCULAR | Status: DC | PRN
  Administered 2022-05-05: 15 mL via PERINEURAL

## 2022-05-05 MED ORDER — BACLOFEN 5 MG PO TABS: 5.0000 mg | ORAL_TABLET | Freq: Three times a day (TID) | ORAL | 0 refills | Status: DC | PRN

## 2022-05-05 MED ORDER — GLYCOPYRROLATE PF 0.2 MG/ML IJ SOSY
PREFILLED_SYRINGE | INTRAMUSCULAR | Status: AC
  Filled 2022-05-05: qty 1

## 2022-05-05 MED ORDER — LIDOCAINE HCL (PF) 2 % IJ SOLN
INTRAMUSCULAR | Status: AC
  Filled 2022-05-05: qty 5

## 2022-05-05 MED ORDER — CEFAZOLIN SODIUM-DEXTROSE 2-4 GM/100ML-% IV SOLN
2.0000 g | INTRAVENOUS | Status: AC
  Administered 2022-05-05: 2 g via INTRAVENOUS

## 2022-05-05 MED ORDER — FENTANYL CITRATE (PF) 100 MCG/2ML IJ SOLN
INTRAMUSCULAR | Status: DC | PRN
  Administered 2022-05-05: 50 ug via INTRAVENOUS

## 2022-05-05 MED ORDER — MIDAZOLAM HCL 2 MG/2ML IJ SOLN
INTRAMUSCULAR | Status: DC | PRN
  Administered 2022-05-05: 2 mg via INTRAVENOUS

## 2022-05-05 MED ORDER — ACETAMINOPHEN 500 MG PO TABS
ORAL_TABLET | ORAL | Status: AC
  Filled 2022-05-05: qty 2

## 2022-05-05 MED ORDER — ONDANSETRON HCL 4 MG/2ML IJ SOLN
INTRAMUSCULAR | Status: AC
  Filled 2022-05-05: qty 2

## 2022-05-05 MED ORDER — CEFAZOLIN SODIUM-DEXTROSE 2-4 GM/100ML-% IV SOLN
INTRAVENOUS | Status: AC
  Filled 2022-05-05: qty 100

## 2022-05-05 MED ORDER — LACTATED RINGERS IV SOLN: INTRAVENOUS | Status: DC

## 2022-05-05 SURGICAL SUPPLY — 46 items
AID PSTN UNV HD RSTRNT DISP (MISCELLANEOUS) ×1
ANCH SUT 2 SWLK 19.1 CLS EYLT (Anchor) ×1 IMPLANT
ANCH SUT FBRTAPE 1.3X2.6X1.7 (Anchor) ×1 IMPLANT
ANCHOR SUT FBRTK 2.6 SOFT 1.7 (Anchor) IMPLANT
ANCHOR SWIVELOCK BIO 4.75X19.1 (Anchor) IMPLANT
APL PRP STRL LF DISP 70% ISPRP (MISCELLANEOUS) ×1
CANNULA TWIST IN 8.25X7CM (CANNULA) IMPLANT
CHLORAPREP W/TINT 26 (MISCELLANEOUS) ×1 IMPLANT
DISSECTOR 4.0MM X 13CM (MISCELLANEOUS) IMPLANT
DRAPE IMP U-DRAPE 54X76 (DRAPES) ×1 IMPLANT
DRAPE INCISE IOBAN 66X45 STRL (DRAPES) ×1 IMPLANT
DRAPE ORTHO SPLIT 77X108 STRL (DRAPES) ×2
DRAPE SHEET LG 3/4 BI-LAMINATE (DRAPES) IMPLANT
DRAPE SHOULDER BEACH CHAIR (DRAPES) ×1 IMPLANT
DRAPE STERI 35X30 U-POUCH (DRAPES) ×1 IMPLANT
DRAPE SURG ORHT 6 SPLT 77X108 (DRAPES) ×2 IMPLANT
DRAPE U-SHAPE 47X51 STRL (DRAPES) ×1 IMPLANT
GAUZE 4X4 16PLY ~~LOC~~+RFID DBL (SPONGE) ×1 IMPLANT
GAUZE PAD ABD 8X10 STRL (GAUZE/BANDAGES/DRESSINGS) ×1 IMPLANT
GAUZE SPONGE 4X4 12PLY STRL (GAUZE/BANDAGES/DRESSINGS) ×2 IMPLANT
GAUZE XEROFORM 1X8 LF (GAUZE/BANDAGES/DRESSINGS) IMPLANT
GLOVE BIO SURGEON STRL SZ 6.5 (GLOVE) ×1 IMPLANT
GLOVE BIO SURGEON STRL SZ7.5 (GLOVE) ×2 IMPLANT
GLOVE BIOGEL PI IND STRL 5.5 (GLOVE) IMPLANT
GLOVE BIOGEL PI IND STRL 6.5 (GLOVE) ×1 IMPLANT
GLOVE BIOGEL PI IND STRL 7.0 (GLOVE) IMPLANT
GLOVE BIOGEL PI IND STRL 7.5 (GLOVE) ×1 IMPLANT
GLOVE BIOGEL PI IND STRL 8 (GLOVE) ×2 IMPLANT
GLOVE SURG SS PI 7.0 STRL IVOR (GLOVE) IMPLANT
GLOVE SURG SS PI 7.5 STRL IVOR (GLOVE) ×1 IMPLANT
GOWN STRL REUS W/TWL LRG LVL3 (GOWN DISPOSABLE) ×1 IMPLANT
GOWN STRL REUS W/TWL XL LVL3 (GOWN DISPOSABLE) ×1 IMPLANT
IV NS IRRIG 3000ML ARTHROMATIC (IV SOLUTION) ×4 IMPLANT
KIT TURNOVER CYSTO (KITS) ×1 IMPLANT
MANIFOLD NEPTUNE II (INSTRUMENTS) ×1 IMPLANT
NDL HD SCORPION MEGA LOADER (NEEDLE) IMPLANT
NS IRRIG 500ML POUR BTL (IV SOLUTION) IMPLANT
PACK ARTHROSCOPY DSU (CUSTOM PROCEDURE TRAY) ×1 IMPLANT
PACK BASIN DAY SURGERY FS (CUSTOM PROCEDURE TRAY) ×1 IMPLANT
RESTRAINT HEAD UNIVERSAL NS (MISCELLANEOUS) ×1 IMPLANT
SLEEVE SCD COMPRESS KNEE MED (STOCKING) ×1 IMPLANT
SLING ARM FOAM STRAP LRG (SOFTGOODS) IMPLANT
SUT ETHILON 3 0 PS 1 (SUTURE) ×1 IMPLANT
TOWEL OR 17X26 10 PK STRL BLUE (TOWEL DISPOSABLE) ×1 IMPLANT
TUBING ARTHROSCOPY IRRIG 16FT (MISCELLANEOUS) ×1 IMPLANT
YANKAUER SUCT BULB TIP NO VENT (SUCTIONS) IMPLANT

## 2022-05-05 NOTE — Op Note (Signed)
05/05/2022  10:07 AM  PATIENT:  Todd Ryan    PRE-OPERATIVE DIAGNOSIS:  RIGHT SHOULDER ROTATOR CUFF TEAR  POST-OPERATIVE DIAGNOSIS:  Same  PROCEDURE:  SHOULDER ARTHROSCOPY WITH ROTATOR CUFF REPAIR AND SUBACROMIAL DECOMPRESSION. LOOSE BODYEXCISION  SURGEON:  Renette Butters, MD  ASSISTANT: Aggie Moats, PA-C, he was present and scrubbed throughout the case, critical for completion in a timely fashion, and for retraction, instrumentation, and closure.   ANESTHESIA:   General  PREOPERATIVE INDICATIONS:  Todd Ryan is a  61 y.o. male with a diagnosis of [place] who failed conservative measures and elected for surgical management.    The risks benefits and alternatives were discussed with the patient preoperatively including but not limited to the risks of infection, bleeding, nerve injury, cardiopulmonary complications, the need for revision surgery, among others, and the patient was willing to proceed.  DIAGNOSES: Right shoulder, acute on chronic rotator cuff tear, subacromial impingement, and   .  POST-OPERATIVE DIAGNOSIS:  Same plus loose body noted 48m  PROCEDURE: Open repair acute rotator cuff tear - 23410 Arthroscopic extensive debridement - 29823 Subdeltoid Bursa and synovium, supraspinatus tear Arthroscopic subacromial decompression - 29826   OPERATIVE FINDING: Exam under anesthesia: Normal Articular space: Normal Chondral surfaces: Normal Biceps:  stable tenodesis Subscapularis: Intact  Supraspinatus: Incomplete tear  Infraspinatus: Intact  Loose body in the GGoshenspace     BLOOD LOSS: minimal  COMPLICATIONS: none  OPERATIVE PROCEDURE:  Patient was identified in the preoperative holding area and site was marked by me He was transported to the operating theater and placed on the table in beach chair position taking care to pad all bony prominences. After a preincinduction time out anesthesia was induced. The right upper extremity  was prepped and draped in normal sterile fashion and a pre-incision timeout was performed. PThornton DalesPerson received ancef for preoperative antibiotics.   Initially made a posterior arthroscopic portal and inserted the arthroscope into the glenohumeral joint. tour of the joint demonstrated the above operative findings  I created an anterior portal just lateral to the coracoid under direct visualization using a spinal needle.  I performed an extensive debridement of the scarred synovial tissue and remaining structures   I removed a 666mloose body from the [facility]  I then introduced the arthroscope into the subacromial space and brought the shaver into the anterior portal. I debrided the bursa for appropriate visualization.  I then performed a subacromial decompression using combination of the shaver ArthroCare and burr using a cutting block technique. As happy with the final elevation of the subacromial space on multiple portal views.   I debrided the rotator cuff tear and examined its mobility. There was a roughly 6 millimeters tear and I debrided the tear here. I then debrided the footprint to good bone for placement of the tendon. I repaired the cuff with a 1x1 dual row repair  Next I removed all arthroscopic equipment expressed all fluid and closed the portals with a nylon stitch. A sterile dressing was applied the patient was taken the PACU in stable condition.  POST OPERATIVE PLAN: The patient will be in a sling full-time and keep the dressings clean dry and intact. DVT prophylaxis will consist of early ambulation

## 2022-05-05 NOTE — Anesthesia Postprocedure Evaluation (Signed)
Anesthesia Post Note  Patient: Todd Ryan  Procedure(s) Performed: SHOULDER ARTHROSCOPY WITH ROTATOR CUFF REPAIR AND SUBACROMIAL DECOMPRESSION. LOOSE BODYEXCISION (Right: Shoulder)     Patient location during evaluation: PACU Anesthesia Type: General Level of consciousness: awake and alert Pain management: pain level controlled Vital Signs Assessment: post-procedure vital signs reviewed and stable Respiratory status: spontaneous breathing, nonlabored ventilation and respiratory function stable Cardiovascular status: blood pressure returned to baseline Postop Assessment: no apparent nausea or vomiting Anesthetic complications: no   No notable events documented.  Last Vitals:  Vitals:   05/05/22 1030 05/05/22 1045  BP: (!) 134/93 (!) 117/91  Pulse: (!) 58 62  Resp: (!) 23 19  Temp: 36.5 C   SpO2: 94% 95%    Last Pain:  Vitals:   05/05/22 1045  TempSrc:   PainSc: 0-No pain                 Marthenia Rolling

## 2022-05-05 NOTE — Progress Notes (Signed)
Assisted Dr. Daiva Huge with right, interscalene  block. Side rails up, monitors on throughout procedure. See vital signs in flow sheet. Tolerated Procedure well.

## 2022-05-05 NOTE — Anesthesia Procedure Notes (Signed)
Anesthesia Regional Block: Interscalene brachial plexus block   Pre-Anesthetic Checklist: , timeout performed,  Correct Patient, Correct Site, Correct Laterality,  Correct Procedure, Correct Position, site marked,  Risks and benefits discussed,  Pre-op evaluation,  At surgeon's request and post-op pain management  Laterality: Right  Prep: Maximum Sterile Barrier Precautions used, chloraprep       Needles:  Injection technique: Single-shot  Needle Type: Echogenic Stimulator Needle     Needle Length: 4cm  Needle Gauge: 22     Additional Needles:   Procedures:,,,, ultrasound used (permanent image in chart),,    Narrative:  Start time: 05/05/2022 7:57 AM End time: 05/05/2022 8:00 AM Injection made incrementally with aspirations every 5 mL.  Performed by: Personally  Anesthesiologist: Brennan Bailey, MD  Additional Notes: Risks, benefits, and alternative discussed. Patient gave consent for procedure. Patient prepped and draped in sterile fashion. Sedation administered, patient remains easily responsive to voice. Relevant anatomy identified with ultrasound guidance. Local anesthetic given in 5cc increments with no signs or symptoms of intravascular injection. No pain or paraesthesias with injection. Patient monitored throughout procedure with signs of LAST or immediate complications. Tolerated well. Ultrasound image placed in chart.  Tawny Asal, MD

## 2022-05-05 NOTE — Transfer of Care (Signed)
Immediate Anesthesia Transfer of Care Note  Patient: Lenard Kampf Mittag  Procedure(s) Performed: Procedure(s) (LRB): SHOULDER ARTHROSCOPY WITH ROTATOR CUFF REPAIR AND SUBACROMIAL DECOMPRESSION. LOOSE BODYEXCISION (Right)  Patient Location: PACU  Anesthesia Type: General  Level of Consciousness: awake, oriented, sedated and patient cooperative  Airway & Oxygen Therapy: Patient Spontanous Breathing and Patient connected to face mask oxygen  Post-op Assessment: Report given to PACU RN and Post -op Vital signs reviewed and stable  Post vital signs: Reviewed and stable  Complications: No apparent anesthesia complications Last Vitals:  Vitals Value Taken Time  BP 131/97 05/05/22 1015  Temp 36.6 C 05/05/22 1010  Pulse 56 05/05/22 1019  Resp 18 05/05/22 1019  SpO2 92 % 05/05/22 1019  Vitals shown include unvalidated device data.  Last Pain:  Vitals:   05/05/22 1015  TempSrc:   PainSc: 0-No pain      Patients Stated Pain Goal: 3 (86/77/37 3668)  Complications: No notable events documented.

## 2022-05-05 NOTE — Interval H&P Note (Signed)
History and Physical Interval Note:  05/05/2022 7:31 AM  Todd Ryan  has presented today for surgery, with the diagnosis of [place].  The various methods of treatment have been discussed with the patient and family. After consideration of risks, benefits and other options for treatment, the patient has consented to  Procedure(s): SHOULDER ARTHROSCOPY WITH ROTATOR CUFF REPAIR AND SUBACROMIAL DECOMPRESSION (Right) as a surgical intervention.  The patient's history has been reviewed, patient examined, no change in status, stable for surgery.  I have reviewed the patient's chart and labs.  Questions were answered to the patient's satisfaction.     Renette Butters

## 2022-05-05 NOTE — Anesthesia Procedure Notes (Addendum)
Procedure Name: Intubation Date/Time: 05/05/2022 9:01 AM  Performed by: Suan Halter, CRNAPre-anesthesia Checklist: Patient identified, Emergency Drugs available, Suction available and Patient being monitored Patient Re-evaluated:Patient Re-evaluated prior to induction Oxygen Delivery Method: Circle system utilized Preoxygenation: Pre-oxygenation with 100% oxygen Induction Type: IV induction Ventilation: Mask ventilation without difficulty Laryngoscope Size: 3, Glidescope and Mac Grade View: Grade I Tube type: Oral Tube size: 7.0 mm Number of attempts: 1 Airway Equipment and Method: Stylet and Oral airway Placement Confirmation: ETT inserted through vocal cords under direct vision, positive ETCO2 and breath sounds checked- equal and bilateral Secured at: 22 cm Tube secured with: Tape Dental Injury: Teeth and Oropharynx as per pre-operative assessment  Comments: Pt with overbite and previously documented grade III view.  Proceeded directly with Glidescope, atraumatic ETT placement, bilateral breath sounds and ETCO2 confirmed, teeth as per pre-op

## 2022-05-06 ENCOUNTER — Encounter (HOSPITAL_BASED_OUTPATIENT_CLINIC_OR_DEPARTMENT_OTHER): Payer: Self-pay | Admitting: Orthopedic Surgery

## 2022-05-26 NOTE — Therapy (Incomplete)
OUTPATIENT PHYSICAL THERAPY SHOULDER EVALUATION   Patient Name: Todd Ryan MRN: [id number] DOB:02-22-1961, 61 y.o., male Today's Date: 05/26/2022  END OF SESSION:   Past Medical History:  Diagnosis Date   Allergic rhinitis    Arthritis    shoulder, neck, back   Asthma    Chronic back pain    GAD (generalized anxiety disorder)    GERD (gastroesophageal reflux disease)    History of asthma    child   Hyperlipidemia    Hypertension    MDD (major depressive disorder)    OSA on CPAP    07-15-2020  per pt uses approx. 3 times weekly   Peripheral neuropathy    Pre-diabetes    Rotator cuff tear, right    Wears glasses    Past Surgical History:  Procedure Laterality Date   COLONOSCOPY WITH PROPOFOL  05/31/2020   LUMBAR SPINE SURGERY  11/2014   SHOULDER ARTHROSCOPY WITH ROTATOR CUFF REPAIR AND SUBACROMIAL DECOMPRESSION Right 07/16/2020   Procedure: SHOULDER ARTHROSCOPY WITH ROTATOR CUFF REPAIR AND SUBACROMIAL DECOMPRESSION;  Surgeon: Renette Butters, MD;  Location: [facility];  Service: Orthopedics;  Laterality: Right;   SHOULDER ARTHROSCOPY WITH ROTATOR CUFF REPAIR AND SUBACROMIAL DECOMPRESSION Right 05/05/2022   Procedure: SHOULDER ARTHROSCOPY WITH ROTATOR CUFF REPAIR AND SUBACROMIAL DECOMPRESSION. LOOSE BODYEXCISION;  Surgeon: Renette Butters, MD;  Location: [facility];  Service: Orthopedics;  Laterality: Right;   Patient Active Problem List   Diagnosis Date Noted   Body mass index (BMI) 32.0-32.9, adult 02/19/2021   Right shoulder pain 02/19/2021   Neck pain 11/21/2020   DDD (degenerative disc disease), lumbosacral 10/20/2019   TIA (transient ischemic attack) 06/02/2018   Hypertension 38/18/2993   Complication of surgical procedure 06/03/2015   Displacement of lumbar intervertebral disc without myelopathy 08/27/2014   Lower back injury 12/07/2013    PCP: Nolene Ebbs, MD  REFERRING PROVIDER: Renette Butters,  MD   REFERRING DIAG: right shoulder RCR ,SAD loose body removal 05/05/22   THERAPY DIAG:  No diagnosis found.  Rationale for Evaluation and Treatment: {HABREHAB:27488}  ONSET DATE: ***  SUBJECTIVE:                                                                                                                                                                                      SUBJECTIVE STATEMENT: ***  PERTINENT HISTORY: ***  PAIN:  Are you having pain? Yes: NPRS scale: ***/10 Pain location: *** Pain description: *** Aggravating factors: *** Relieving factors: ***  PRECAUTIONS: {Therapy precautions:24002}  WEIGHT BEARING RESTRICTIONS: {Yes ***/No:24003}  FALLS:  Has patient fallen in last 6 months? {fallsyesno:27318}  LIVING ENVIRONMENT:  Lives with: {[facility] lives with:25569::"lives with their family"} Lives in: {Lives in:25570} Stairs: {opstairs:27293} Has following equipment at home: {Assistive devices:23999}  OCCUPATION: ***  PLOF: {PLOF:24004}  PATIENT GOALS:***  NEXT MD VISIT:   OBJECTIVE:   DIAGNOSTIC FINDINGS:  ***  PATIENT SURVEYS:  {rehab surveys:24030:a}  COGNITION: Overall cognitive status: {cognition:24006}     SENSATION: {sensation:27233}  POSTURE: ***  UPPER EXTREMITY ROM:   {AROM/PROM:27142} ROM Right eval Left eval  Shoulder flexion    Shoulder extension    Shoulder abduction    Shoulder adduction    Shoulder internal rotation    Shoulder external rotation    Elbow flexion    Elbow extension    Wrist flexion    Wrist extension    Wrist ulnar deviation    Wrist radial deviation    Wrist pronation    Wrist supination    (Blank rows = not tested)  UPPER EXTREMITY MMT:  MMT Right eval Left eval  Shoulder flexion    Shoulder extension    Shoulder abduction    Shoulder adduction    Shoulder internal rotation    Shoulder external rotation    Middle trapezius    Lower trapezius    Elbow flexion    Elbow extension     Wrist flexion    Wrist extension    Wrist ulnar deviation    Wrist radial deviation    Wrist pronation    Wrist supination    Grip strength (lbs)    (Blank rows = not tested)  SHOULDER SPECIAL TESTS: Impingement tests: {shoulder impingement test:25231:a} SLAP lesions: {SLAP lesions:25232} Instability tests: {shoulder instability test:25233} Rotator cuff assessment: {rotator cuff assessment:25234} Biceps assessment: {biceps assessment:25235}  JOINT MOBILITY TESTING:  ***  PALPATION:  ***   TODAY'S TREATMENT:                                                                                                                                         DATE: ***   PATIENT EDUCATION: Education details: *** [place] educated: {[place] educated:25204} Education method: {Education Method:25205} Education comprehension: {Education Comprehension:25206}  HOME EXERCISE PROGRAM: ***  ASSESSMENT:  CLINICAL IMPRESSION: Patient is a 61 y.o. male who was seen today for physical therapy evaluation and treatment for right shoulder RCR ,SAD loose body removal 05/05/22 .   OBJECTIVE IMPAIRMENTS: {opptimpairments:25111}.   ACTIVITY LIMITATIONS: {activitylimitations:27494}  PARTICIPATION LIMITATIONS: {participationrestrictions:25113}  PERSONAL FACTORS: {Personal factors:25162} are also affecting patient's functional outcome.   REHAB POTENTIAL: {rehabpotential:25112}  CLINICAL DECISION MAKING: {clinical decision making:25114}  EVALUATION COMPLEXITY: {Evaluation complexity:25115}   GOALS: Goals reviewed with patient? {yes/no:20286}  SHORT TERM GOALS: Target date: ***  *** Baseline: Goal status: {GOALSTATUS:25110}  2.  *** Baseline:  Goal status: {GOALSTATUS:25110}  3.  *** Baseline:  Goal status: {GOALSTATUS:25110}  4.  *** Baseline:  Goal status: {GOALSTATUS:25110}  5.  *** Baseline:  Goal status: {GOALSTATUS:25110}  6.  ***  Baseline:  Goal status:  {GOALSTATUS:25110}  LONG TERM GOALS: Target date: ***  *** Baseline:  Goal status: {GOALSTATUS:25110}  2.  *** Baseline:  Goal status: {GOALSTATUS:25110}  3.  *** Baseline:  Goal status: {GOALSTATUS:25110}  4.  *** Baseline:  Goal status: {GOALSTATUS:25110}  5.  *** Baseline:  Goal status: {GOALSTATUS:25110}  6.  *** Baseline:  Goal status: {GOALSTATUS:25110}  PLAN:  PT FREQUENCY: {rehab frequency:25116}  PT DURATION: {rehab duration:25117}  PLANNED INTERVENTIONS: {rehab planned interventions:25118::"Therapeutic exercises","Therapeutic activity","Neuromuscular re-education","Balance training","Gait training","Patient/Family education","Self Care","Joint mobilization"}  PLAN FOR NEXT SESSION: ***   Gar Ponto, PT 05/26/2022, 9:19 PM

## 2022-05-27 ENCOUNTER — Ambulatory Visit: Payer: Medicaid Other

## 2022-05-29 ENCOUNTER — Other Ambulatory Visit: Payer: Self-pay

## 2022-05-29 ENCOUNTER — Ambulatory Visit: Payer: Medicaid Other | Attending: Internal Medicine

## 2022-05-29 DIAGNOSIS — M25611 Stiffness of right shoulder, not elsewhere classified: Secondary | ICD-10-CM | POA: Insufficient documentation

## 2022-05-29 DIAGNOSIS — M6281 Muscle weakness (generalized): Secondary | ICD-10-CM | POA: Insufficient documentation

## 2022-05-29 DIAGNOSIS — M25511 Pain in right shoulder: Secondary | ICD-10-CM | POA: Insufficient documentation

## 2022-05-29 NOTE — Therapy (Signed)
OUTPATIENT PHYSICAL THERAPY SHOULDER EVALUATION   Patient Name: Todd Ryan MRN: [id number] DOB:October 05, 1960, 61 y.o., male Today's Date: 05/29/2022  END OF SESSION:  PT End of Session - 05/29/22 0808     Visit Number 1    Number of Visits 19    Date for PT Re-Evaluation 07/31/22    Authorization Type [place] MEDICAID [employer]    PT Start Time 0800    PT Stop Time 0845    PT Time Calculation (min) 45 min    Activity Tolerance Patient tolerated treatment well    Behavior During Therapy WFL for tasks assessed/performed             Past Medical History:  Diagnosis Date   Allergic rhinitis    Arthritis    shoulder, neck, back   Asthma    Chronic back pain    GAD (generalized anxiety disorder)    GERD (gastroesophageal reflux disease)    History of asthma    child   Hyperlipidemia    Hypertension    MDD (major depressive disorder)    OSA on CPAP    07-15-2020  per pt uses approx. 3 times weekly   Peripheral neuropathy    Pre-diabetes    Rotator cuff tear, right    Wears glasses    Past Surgical History:  Procedure Laterality Date   COLONOSCOPY WITH PROPOFOL  05/31/2020   LUMBAR SPINE SURGERY  11/2014   SHOULDER ARTHROSCOPY WITH ROTATOR CUFF REPAIR AND SUBACROMIAL DECOMPRESSION Right 07/16/2020   Procedure: SHOULDER ARTHROSCOPY WITH ROTATOR CUFF REPAIR AND SUBACROMIAL DECOMPRESSION;  Surgeon: Renette Butters, MD;  Location: [facility];  Service: Orthopedics;  Laterality: Right;   SHOULDER ARTHROSCOPY WITH ROTATOR CUFF REPAIR AND SUBACROMIAL DECOMPRESSION Right 05/05/2022   Procedure: SHOULDER ARTHROSCOPY WITH ROTATOR CUFF REPAIR AND SUBACROMIAL DECOMPRESSION. LOOSE BODYEXCISION;  Surgeon: Renette Butters, MD;  Location: [facility];  Service: Orthopedics;  Laterality: Right;   Patient Active Problem List   Diagnosis Date Noted   Body mass index (BMI) 32.0-32.9, adult 02/19/2021   Right shoulder pain 02/19/2021    Neck pain 11/21/2020   DDD (degenerative disc disease), lumbosacral 10/20/2019   TIA (transient ischemic attack) 06/02/2018   Hypertension 55/37/4827   Complication of surgical procedure 06/03/2015   Displacement of lumbar intervertebral disc without myelopathy 08/27/2014   Lower back injury 12/07/2013    PCP: Nolene Ebbs, MD  REFERRING PROVIDER: Renette Butters, MD  REFERRING DIAG: right shoulder RCR ,SAD loose body removal 05/05/22   THERAPY DIAG:  Acute pain of right shoulder  Stiffness of right shoulder, not elsewhere classified  Muscle weakness (generalized)  Rationale for Evaluation and Treatment: Rehabilitation  ONSET DATE: Over 10 years  SUBJECTIVE:  SUBJECTIVE STATEMENT: His R shoulder is doing well with him experiencing a low level of pain. MOI wear and tear over the years.  PERTINENT HISTORY: Hx 07/16/20 for SHOULDER ARTHROSCOPY WITH ROTATOR CUFF REPAIR AND SUBACROMIAL DECOMPRESSION, chronic back pain, HTN, GAD, MMD, peripheral neuropathy  PAIN:  Are you having pain? Yes: NPRS scale: 1/10 Pain location: R GH joint area laterally and anteriorly Pain description: Burning, sharp Aggravating factors: Certain movements Relieving factors: Rest, pain medication  PRECAUTIONS: Shoulder PROM only for 4 weeks, prescription written 05/15/22  WEIGHT BEARING RESTRICTIONS: Yes NWB  FALLS:  Has patient fallen in last 6 months? No  LIVING ENVIRONMENT: Lives with: lives with their family Lives in: House/apartment No issues with accessing or mobility within home  OCCUPATION: Unemployed  PLOF: Independent  PATIENT GOALS:to have good ROM and use of my R arm with no pain NEXT MD VISIT: Middle of the month  OBJECTIVE:   DIAGNOSTIC FINDINGS:  06/18/20/MRI R shoulder in Epic  PATIENT  SURVEYS:  UEFS 25/40  COGNITION: Overall cognitive status: Within functional limits for tasks assessed  OBSERVATION:  Healed incision with min. scar tissue     SENSATION: WFL  POSTURE: Forward head and rounded shoulders  UPPER EXTREMITY ROM:   Passive ROM Right eval Left eval  Shoulder flexion 120 158  Shoulder extension 100 155  Shoulder abduction    Shoulder adduction    Shoulder internal rotation    Shoulder external rotation 16 50  Elbow flexion    Elbow extension    Wrist flexion    Wrist extension    Wrist ulnar deviation    Wrist radial deviation    Wrist pronation    Wrist supination    (Blank rows = not tested)  UPPER EXTREMITY MMT:   Not tested MMT Right eval Left eval  Shoulder flexion    Shoulder extension    Shoulder abduction    Shoulder adduction    Shoulder internal rotation    Shoulder external rotation    Middle trapezius    Lower trapezius    Elbow flexion    Elbow extension    Wrist flexion    Wrist extension    Wrist ulnar deviation    Wrist radial deviation    Wrist pronation    Wrist supination    Grip strength (lbs)    (Blank rows = not tested)  SHOULDER SPECIAL TESTS: Impingement tests:  NT SLAP lesions:  NT Instability tests:  NT Rotator cuff assessment:  NT Biceps assessment:  NT  JOINT MOBILITY TESTING:  NT  PALPATION:  TTP of the R [place] jt area, most significantly laterally and anteriorly   TODAY'S TREATMENT:                                                                                                                                         [facility] Adult PT Treatment:  DATE: 05/29/22 Therapeutic Exercise: Developed, instructed in, and completed therex as noted in HEP  Self Care: Sleeping positions for support for comfort  Massage for scar management Ice pack for pain/swelling management  PATIENT EDUCATION: Education details: Eval findings, POC, HEP, self care   [place] educated: Patient Education method: Explanation, Demonstration, Tactile cues, Verbal cues, and Handouts Education comprehension: verbalized understanding, returned demonstration, verbal cues required, and tactile cues required  HOME EXERCISE PROGRAM: Access Code: [email or web address] URL: [email or web address] Date: 05/29/2022 Prepared by: Gar Ponto  Exercises - Flexion-Extension Shoulder Pendulum with Table Support  - 2 x daily - 7 x weekly - 1 sets - 20 reps - Horizontal Shoulder Pendulum with Table Support  - 1 x daily - 7 x weekly - 1 sets - 20 reps  ASSESSMENT:  CLINICAL IMPRESSION: Patient is a 61 y.o. male who was seen today for physical therapy evaluation and treatment for right shoulder RCR ,SAD loose body removal 05/05/22 . Per protocol, pt is to be PROM only for 4 weeks. Currently, pt's R shoulder pain level is low with anticipated PROM limitations post surgery. Pt will benefit from skilled PT to address impairments for improved function of the R shoulder with decreased pain.  OBJECTIVE IMPAIRMENTS: decreased ROM, decreased strength, increased edema, impaired UE functional use, obesity, and heavy arm .   ACTIVITY LIMITATIONS: carrying, lifting, bathing, dressing, self feeding, reach over head, and caring for others  PARTICIPATION LIMITATIONS: meal prep, cleaning, laundry, and yard work  PERSONAL FACTORS: Fitness, Past/current experiences, Time since onset of injury/illness/exacerbation, and 1-2 comorbidities: prior R shoulder surgery, high BMI  are also affecting patient's functional outcome.   REHAB POTENTIAL: Good  CLINICAL DECISION MAKING: Evolving/moderate complexity  EVALUATION COMPLEXITY: Moderate   GOALS:  SHORT TERM GOALS: Target date: 06/19/22  Pt will be Ind in an initial HEP Baseline: initiated Goal status: INITIAL  2.  Increase R shoulder PROM to flexion 140d, abd 120d, ER 40d Baseline: see flow sheets Goal status: INITIAL  3.  Pt will  voice understanding of measures to assist in pain reduction Baseline: initiated Goal status: INITIAL  LONG TERM GOALS: Target date: 07/31/22  Pt will be Ind in a final HEP to maintain achieved LOF  Baseline: Initiated Goal status: INITIAL  2.  Increase R shoulder PROM to within 95% of the L shoulder for appropriate R shoulder function Baseline: See flow sheet Goal status: INITIAL  3.  Increase R shoulder AROM to within 90% of the L shoulder for appropriate R shoulder function for overhead activities Baseline: Unable- TBA Goal status: INITIAL  4.  The R shoulder will demonstrate 4+/5 strength to ADLs and household activities with little to no difficulty Baseline: Unable Goal status: INITIAL  5.  Improved UEFS to15/40 as indication of improved function Baseline: 25/40 Goal status: INITIAL  PLAN:  PT FREQUENCY: 2x/week  PT DURATION: 8 weeks  PLANNED INTERVENTIONS: Therapeutic exercises, Therapeutic activity, Patient/Family education, Self Care, Joint mobilization, Dry Needling, Electrical stimulation, Cryotherapy, Moist heat, Taping, Vasopneumatic device, Ultrasound, Ionotophoresis '4mg'$ /ml Dexamethasone, Manual therapy, and Re-evaluation  PLAN FOR NEXT SESSION: Assess response to HEP; progress therex as indicated; use of modalities, manual therapy; and TPDN as indicated.  Abrar Koone MS, PT 05/29/22 11:16 AM  Check all possible CPT codes: 31517 - PT Re-evaluation, 97110- Therapeutic Exercise, 97140 - Manual Therapy, 97530 - Therapeutic Activities, 61607 - Self Care, 97014 - Electrical stimulation (unattended), [id number] - Electrical stimulation (Manual), [id number] - Iontophoresis, [id number] - Ultrasound, [id number] - Vaso, and [id number] -  Aquatic therapy    Check all conditions that are expected to impact treatment: Social determinants of health   If treatment provided at initial evaluation, no treatment charged due to lack of authorization.

## 2022-06-02 NOTE — Therapy (Signed)
OUTPATIENT PHYSICAL THERAPY TREATMENT NOTE   Patient Name: Todd Ryan MRN: [id number] DOB:12-Jul-1960, 61 y.o., male Today's Date: 06/03/2022  PCP: Nolene Ebbs, MD  REFERRING PROVIDER: Renette Butters, MD   END OF SESSION:   PT End of Session - 06/03/22 0854     Visit Number 2    Number of Visits 19    Date for PT Re-Evaluation 07/31/22    Authorization Type [place] MEDICAID [employer]    PT Start Time 0850    PT Stop Time 0930    PT Time Calculation (min) 40 min    Activity Tolerance Patient tolerated treatment well    Behavior During Therapy WFL for tasks assessed/performed             Past Medical History:  Diagnosis Date   Allergic rhinitis    Arthritis    shoulder, neck, back   Asthma    Chronic back pain    GAD (generalized anxiety disorder)    GERD (gastroesophageal reflux disease)    History of asthma    child   Hyperlipidemia    Hypertension    MDD (major depressive disorder)    OSA on CPAP    07-15-2020  per pt uses approx. 3 times weekly   Peripheral neuropathy    Pre-diabetes    Rotator cuff tear, right    Wears glasses    Past Surgical History:  Procedure Laterality Date   COLONOSCOPY WITH PROPOFOL  05/31/2020   LUMBAR SPINE SURGERY  11/2014   SHOULDER ARTHROSCOPY WITH ROTATOR CUFF REPAIR AND SUBACROMIAL DECOMPRESSION Right 07/16/2020   Procedure: SHOULDER ARTHROSCOPY WITH ROTATOR CUFF REPAIR AND SUBACROMIAL DECOMPRESSION;  Surgeon: Renette Butters, MD;  Location: [facility];  Service: Orthopedics;  Laterality: Right;   SHOULDER ARTHROSCOPY WITH ROTATOR CUFF REPAIR AND SUBACROMIAL DECOMPRESSION Right 05/05/2022   Procedure: SHOULDER ARTHROSCOPY WITH ROTATOR CUFF REPAIR AND SUBACROMIAL DECOMPRESSION. LOOSE BODYEXCISION;  Surgeon: Renette Butters, MD;  Location: [facility];  Service: Orthopedics;  Laterality: Right;   Patient Active Problem List   Diagnosis Date Noted   Body mass index (BMI)  32.0-32.9, adult 02/19/2021   Right shoulder pain 02/19/2021   Neck pain 11/21/2020   DDD (degenerative disc disease), lumbosacral 10/20/2019   TIA (transient ischemic attack) 06/02/2018   Hypertension 67/20/9198   Complication of surgical procedure 06/03/2015   Displacement of lumbar intervertebral disc without myelopathy 08/27/2014   Lower back injury 12/07/2013    REFERRING DIAG: right shoulder RCR ,SAD loose body removal 05/05/22    THERAPY DIAG:  Acute pain of right shoulder  Stiffness of right shoulder, not elsewhere classified  Muscle weakness (generalized)  Rationale for Evaluation and Treatment Rehabilitation  SUBJECTIVE:  SUBJECTIVE STATEMENT:    PAIN:  Are you having pain? Yes: NPRS scale: 1/10 Pain location: R GH joint area laterally and anteriorly Pain description: Burning, sharp Aggravating factors: Certain movements Relieving factors: Rest, pain medication  PERTINENT HISTORY: Hx 07/16/20 for SHOULDER ARTHROSCOPY WITH ROTATOR CUFF REPAIR AND SUBACROMIAL DECOMPRESSION, chronic back pain, HTN, GAD, MMD, peripheral neuropathy   PRECAUTIONS: Shoulder PROM only for 4 weeks, prescription written 05/15/22   WEIGHT BEARING RESTRICTIONS: Yes NWB   PATIENT GOALS:to have good ROM and use of my R arm with no pain NEXT MD VISIT: Middle of the month   OBJECTIVE: (objective measures completed at initial evaluation unless otherwise dated)   DIAGNOSTIC FINDINGS:  06/18/20/MRI R shoulder in Epic   PATIENT SURVEYS:  UEFS 25/40   COGNITION: Overall cognitive status: Within functional limits for tasks assessed   OBSERVATION:            Healed incision with min. scar tissue                                  SENSATION: WFL   POSTURE: Forward head and rounded shoulders   UPPER EXTREMITY  ROM:    Passive ROM Right eval Left eval Rt 06/03/22  Shoulder flexion 120 158 140  Shoulder extension  155   Shoulder abduction  100   125  Shoulder adduction       Shoulder internal rotation       Shoulder external rotation 16 50 40  Elbow flexion       Elbow extension       Wrist flexion       Wrist extension       Wrist ulnar deviation       Wrist radial deviation       Wrist pronation       Wrist supination       (Blank rows = not tested)   UPPER EXTREMITY MMT:                       Not tested MMT Right eval Left eval  Shoulder flexion      Shoulder extension      Shoulder abduction      Shoulder adduction      Shoulder internal rotation      Shoulder external rotation      Middle trapezius      Lower trapezius      Elbow flexion      Elbow extension      Wrist flexion      Wrist extension      Wrist ulnar deviation      Wrist radial deviation      Wrist pronation      Wrist supination      Grip strength (lbs)      (Blank rows = not tested)   SHOULDER SPECIAL TESTS: Impingement tests:  NT SLAP lesions:  NT Instability tests:  NT Rotator cuff assessment:  NT Biceps assessment:  NT   JOINT MOBILITY TESTING:  NT   PALPATION:  TTP of the R [place] jt area, most significantly laterally and anteriorly             TODAY'S TREATMENT: [facility] Adult PT Treatment:  DATE: 06/03/22 Therapeutic Exercise: Seated scapular retractions 2x10 Pendulum flex/ext and abd/add 2x10 each HEP updated Manual Therapy: PROM for R shoulder for flex, abd, and ER and for the scapula                                                                                                                                     [facility] Adult PT Treatment:                                                DATE: 05/29/22 Therapeutic Exercise: Developed, instructed in, and completed therex as noted in HEP  Self Care: Sleeping positions for support for comfort   Massage for scar management Ice pack for pain/swelling management   PATIENT EDUCATION: Education details: Eval findings, POC, HEP, self care  [place] educated: Patient Education method: Explanation, Demonstration, Tactile cues, Verbal cues, and Handouts Education comprehension: verbalized understanding, returned demonstration, verbal cues required, and tactile cues required   HOME EXERCISE PROGRAM: [email or web address]   ASSESSMENT:   CLINICAL IMPRESSION: PT was completed today for [facility] and scapular PROMs. Pt also completed pendulum therex and scapular retractions. Pt completed the therex properly. [place] PROMs have made good progress since the initial visit. Pt tolerated PT today without adverse effects. Pt will continue to benefit from skilled PT to address impairments for improved function.   OBJECTIVE IMPAIRMENTS: decreased ROM, decreased strength, increased edema, impaired UE functional use, obesity, and heavy arm .    ACTIVITY LIMITATIONS: carrying, lifting, bathing, dressing, self feeding, reach over head, and caring for others   PARTICIPATION LIMITATIONS: meal prep, cleaning, laundry, and yard work   PERSONAL FACTORS: Fitness, Past/current experiences, Time since onset of injury/illness/exacerbation, and 1-2 comorbidities: prior R shoulder surgery, high BMI  are also affecting patient's functional outcome.   GOALS:   SHORT TERM GOALS: Target date: 06/19/22   Pt will be Ind in an initial HEP Baseline: initiated Goal status: Ongoing   2.  Increase R shoulder PROM to flexion 140d, abd 120d, ER 40d Baseline: see flow sheets Status: 06/03/22= see flow sheets Goal status: MET   3.  Pt will voice understanding of measures to assist in pain reduction Baseline: initiated Goal status: Ongoing   LONG TERM GOALS: Target date: 07/31/22   Pt will be Ind in a final HEP to maintain achieved LOF  Baseline: Initiated Goal status: INITIAL   2.  Increase R shoulder PROM to within 95% of the L  shoulder for appropriate R shoulder function Baseline: See flow sheet Goal status: INITIAL   3.  Increase R shoulder AROM to within 90% of the L shoulder for appropriate R shoulder function for overhead activities Baseline: Unable- TBA Goal status: INITIAL   4.  The R shoulder will demonstrate 4+/5 strength to ADLs  and household activities with little to no difficulty Baseline: Unable Goal status: INITIAL   5.  Improved UEFS to15/40 as indication of improved function Baseline: 25/40 Goal status: INITIAL   PLAN:   PT FREQUENCY: 2x/week   PT DURATION: 8 weeks   PLANNED INTERVENTIONS: Therapeutic exercises, Therapeutic activity, Patient/Family education, Self Care, Joint mobilization, Dry Needling, Electrical stimulation, Cryotherapy, Moist heat, Taping, Vasopneumatic device, Ultrasound, Ionotophoresis 54m/ml Dexamethasone, Manual therapy, and Re-evaluation   PLAN FOR NEXT SESSION: Assess response to HEP; progress therex as indicated; use of modalities, manual therapy; and TPDN as indicated.   Lakota Schweppe MS, PT 06/03/22 12:23 PM

## 2022-06-03 ENCOUNTER — Ambulatory Visit: Payer: Medicaid Other

## 2022-06-03 DIAGNOSIS — M25611 Stiffness of right shoulder, not elsewhere classified: Secondary | ICD-10-CM

## 2022-06-03 DIAGNOSIS — M25511 Pain in right shoulder: Secondary | ICD-10-CM

## 2022-06-03 DIAGNOSIS — M6281 Muscle weakness (generalized): Secondary | ICD-10-CM

## 2022-06-08 ENCOUNTER — Encounter: Payer: Self-pay | Admitting: Physical Therapy

## 2022-06-08 ENCOUNTER — Ambulatory Visit: Payer: Medicaid Other | Admitting: Physical Therapy

## 2022-06-08 DIAGNOSIS — M25611 Stiffness of right shoulder, not elsewhere classified: Secondary | ICD-10-CM

## 2022-06-08 DIAGNOSIS — M25511 Pain in right shoulder: Secondary | ICD-10-CM | POA: Diagnosis not present

## 2022-06-08 DIAGNOSIS — M6281 Muscle weakness (generalized): Secondary | ICD-10-CM

## 2022-06-08 NOTE — Therapy (Signed)
OUTPATIENT PHYSICAL THERAPY TREATMENT NOTE   Patient Name: Todd Ryan MRN: [id number] DOB:1960/07/22, 61 y.o., male Today's Date: 06/08/2022  PCP: Nolene Ebbs, MD  REFERRING PROVIDER: Renette Butters, MD   END OF SESSION:   PT End of Session - 06/08/22 1232     Visit Number 3    Number of Visits 19    Date for PT Re-Evaluation 07/31/22    Authorization Type [place] MEDICAID [employer]    PT Start Time 1234    PT Stop Time 1307    PT Time Calculation (min) 33 min             Past Medical History:  Diagnosis Date   Allergic rhinitis    Arthritis    shoulder, neck, back   Asthma    Chronic back pain    GAD (generalized anxiety disorder)    GERD (gastroesophageal reflux disease)    History of asthma    child   Hyperlipidemia    Hypertension    MDD (major depressive disorder)    OSA on CPAP    07-15-2020  per pt uses approx. 3 times weekly   Peripheral neuropathy    Pre-diabetes    Rotator cuff tear, right    Wears glasses    Past Surgical History:  Procedure Laterality Date   COLONOSCOPY WITH PROPOFOL  05/31/2020   LUMBAR SPINE SURGERY  11/2014   SHOULDER ARTHROSCOPY WITH ROTATOR CUFF REPAIR AND SUBACROMIAL DECOMPRESSION Right 07/16/2020   Procedure: SHOULDER ARTHROSCOPY WITH ROTATOR CUFF REPAIR AND SUBACROMIAL DECOMPRESSION;  Surgeon: Renette Butters, MD;  Location: [place];  Service: Orthopedics;  Laterality: Right;   SHOULDER ARTHROSCOPY WITH ROTATOR CUFF REPAIR AND SUBACROMIAL DECOMPRESSION Right 05/05/2022   Procedure: SHOULDER ARTHROSCOPY WITH ROTATOR CUFF REPAIR AND SUBACROMIAL DECOMPRESSION. LOOSE BODYEXCISION;  Surgeon: Renette Butters, MD;  Location: [facility];  Service: Orthopedics;  Laterality: Right;   Patient Active Problem List   Diagnosis Date Noted   Body mass index (BMI) 32.0-32.9, adult 02/19/2021   Right shoulder pain 02/19/2021   Neck pain 11/21/2020   DDD (degenerative disc  disease), lumbosacral 10/20/2019   TIA (transient ischemic attack) 06/02/2018   Hypertension 01/29/2335   Complication of surgical procedure 06/03/2015   Displacement of lumbar intervertebral disc without myelopathy 08/27/2014   Lower back injury 12/07/2013    REFERRING DIAG: right shoulder RCR ,SAD loose body removal 05/05/22    THERAPY DIAG:  Acute pain of right shoulder  Stiffness of right shoulder, not elsewhere classified  Muscle weakness (generalized)  Rationale for Evaluation and Treatment Rehabilitation  SUBJECTIVE:  SUBJECTIVE STATEMENT:    PAIN:  Are you having pain? Yes: NPRS scale: 1/10 Pain location: R GH joint area laterally and anteriorly Pain description: Burning, sharp Aggravating factors: Certain movements Relieving factors: Rest, pain medication  PERTINENT HISTORY: Hx 07/16/20 for SHOULDER ARTHROSCOPY WITH ROTATOR CUFF REPAIR AND SUBACROMIAL DECOMPRESSION, chronic back pain, HTN, GAD, MMD, peripheral neuropathy   PRECAUTIONS: Shoulder PROM only for 4 weeks, prescription written 05/15/22   WEIGHT BEARING RESTRICTIONS: Yes NWB   PATIENT GOALS:to have good ROM and use of my R arm with no pain NEXT MD VISIT: Middle of the month   OBJECTIVE: (objective measures completed at initial evaluation unless otherwise dated)   DIAGNOSTIC FINDINGS:  06/18/20/MRI R shoulder in Epic   PATIENT SURVEYS:  UEFS 25/40   COGNITION: Overall cognitive status: Within functional limits for tasks assessed   OBSERVATION:            Healed incision with min. scar tissue                                  SENSATION: WFL   POSTURE: Forward head and rounded shoulders   UPPER EXTREMITY ROM:    Passive ROM Right eval Left eval Rt 06/03/22  Shoulder flexion 120 158 140  Shoulder extension  155    Shoulder abduction  100   125  Shoulder adduction       Shoulder internal rotation       Shoulder external rotation 16 50 40  Elbow flexion       Elbow extension       Wrist flexion       Wrist extension       Wrist ulnar deviation       Wrist radial deviation       Wrist pronation       Wrist supination       (Blank rows = not tested)   UPPER EXTREMITY MMT:                       Not tested MMT Right eval Left eval  Shoulder flexion      Shoulder extension      Shoulder abduction      Shoulder adduction      Shoulder internal rotation      Shoulder external rotation      Middle trapezius      Lower trapezius      Elbow flexion      Elbow extension      Wrist flexion      Wrist extension      Wrist ulnar deviation      Wrist radial deviation      Wrist pronation      Wrist supination      Grip strength (lbs)      (Blank rows = not tested)   SHOULDER SPECIAL TESTS: Impingement tests:  NT SLAP lesions:  NT Instability tests:  NT Rotator cuff assessment:  NT Biceps assessment:  NT   JOINT MOBILITY TESTING:  NT   PALPATION:  TTP of the R [place] jt area, most significantly laterally and anteriorly             TODAY'S TREATMENT: [facility] Adult PT Treatment:  DATE: 06/08/22 Therapeutic Exercise: Pendulums Standing table stretch-high mat table- walking back for shoulder flexion PROM Standing scap retrac x 20 Seated table slides bilateral flexion Seated scaption table slides  Seated ER PROM using Table - lean overs  Supine short level pullovers- clasped hand PROM ER PROM using dowel  Manual Therapy: PROM for R shoulder flex, and, ER   [facility] Adult PT Treatment:                                                DATE: 06/03/22 Therapeutic Exercise: Seated scapular retractions 2x10 Pendulum flex/ext and abd/add 2x10 each HEP updated Manual Therapy: PROM for R shoulder for flex, abd, and ER and for the scapula                                                                                                                                      [facility] Adult PT Treatment:                                                DATE: 05/29/22 Therapeutic Exercise: Developed, instructed in, and completed therex as noted in HEP  Self Care: Sleeping positions for support for comfort  Massage for scar management Ice pack for pain/swelling management   PATIENT EDUCATION: Education details: Eval findings, POC, HEP, self care  [place] educated: Patient Education method: Explanation, Demonstration, Tactile cues, Verbal cues, and Handouts Education comprehension: verbalized understanding, returned demonstration, verbal cues required, and tactile cues required   HOME EXERCISE PROGRAM: PKP2PBMP Added 06/08/22 - Seated Bilateral Shoulder Flexion Towel Slide at Table Top  - 1 x daily - 7 x weekly - 1 sets - 10 reps - 10 hold - Seated Shoulder Scaption Slide at Table Top with Forearm in Neutral  - 1 x daily - 7 x weekly - 1 sets - 10 reps - 10 hold - Supine Shoulder External Rotation with Dowel  - 1 x daily - 7 x weekly - 1 sets - 10 reps - 10 hold   ASSESSMENT:   CLINICAL IMPRESSION: Pt reports minimal pain in general. He is 4 weeks and 6 days post op. Continued with PROM, advancing to more self PROM. He tolerated session well and declined ice in clinic. He was encouraged to ice at home as he will likely be sore with the progressions performed today.   Pt tolerated PT today without adverse effects. Pt will continue to benefit from skilled PT to address impairments for improved function.   OBJECTIVE IMPAIRMENTS: decreased ROM, decreased strength, increased edema, impaired UE functional use, obesity, and heavy arm .    ACTIVITY LIMITATIONS: carrying, lifting, bathing, dressing,  self feeding, reach over head, and caring for others   PARTICIPATION LIMITATIONS: meal prep, cleaning, laundry, and yard work   PERSONAL FACTORS: Fitness,  Past/current experiences, Time since onset of injury/illness/exacerbation, and 1-2 comorbidities: prior R shoulder surgery, high BMI  are also affecting patient's functional outcome.   GOALS:   SHORT TERM GOALS: Target date: 06/19/22   Pt will be Ind in an initial HEP Baseline: initiated Goal status: Ongoing   2.  Increase R shoulder PROM to flexion 140d, abd 120d, ER 40d Baseline: see flow sheets Status: 06/03/22= see flow sheets Goal status: MET   3.  Pt will voice understanding of measures to assist in pain reduction Baseline: initiated Goal status: Ongoing   LONG TERM GOALS: Target date: 07/31/22   Pt will be Ind in a final HEP to maintain achieved LOF  Baseline: Initiated Goal status: INITIAL   2.  Increase R shoulder PROM to within 95% of the L shoulder for appropriate R shoulder function Baseline: See flow sheet Goal status: INITIAL   3.  Increase R shoulder AROM to within 90% of the L shoulder for appropriate R shoulder function for overhead activities Baseline: Unable- TBA Goal status: INITIAL   4.  The R shoulder will demonstrate 4+/5 strength to ADLs and household activities with little to no difficulty Baseline: Unable Goal status: INITIAL   5.  Improved UEFS to15/40 as indication of improved function Baseline: 25/40 Goal status: INITIAL   PLAN:   PT FREQUENCY: 2x/week   PT DURATION: 8 weeks   PLANNED INTERVENTIONS: Therapeutic exercises, Therapeutic activity, Patient/Family education, Self Care, Joint mobilization, Dry Needling, Electrical stimulation, Cryotherapy, Moist heat, Taping, Vasopneumatic device, Ultrasound, Ionotophoresis 31m/ml Dexamethasone, Manual therapy, and Re-evaluation   PLAN FOR NEXT SESSION: Assess response to HEP; progress therex as indicated; use of modalities, manual therapy; and TPDN as indicated.   JHessie Diener PTA 06/08/22 1:34 PM Phone: [phone]: [phone]

## 2022-06-11 ENCOUNTER — Ambulatory Visit: Payer: Medicaid Other | Admitting: Physical Therapy

## 2022-06-11 ENCOUNTER — Encounter: Payer: Self-pay | Admitting: Physical Therapy

## 2022-06-11 DIAGNOSIS — M6281 Muscle weakness (generalized): Secondary | ICD-10-CM

## 2022-06-11 DIAGNOSIS — M25511 Pain in right shoulder: Secondary | ICD-10-CM

## 2022-06-11 DIAGNOSIS — M25611 Stiffness of right shoulder, not elsewhere classified: Secondary | ICD-10-CM

## 2022-06-11 NOTE — Therapy (Signed)
OUTPATIENT PHYSICAL THERAPY TREATMENT NOTE   Patient Name: Todd Ryan MRN: [id number] DOB:1960/12/01, 61 y.o., male Today's Date: 06/11/2022  PCP: Nolene Ebbs, MD  REFERRING PROVIDER: Renette Butters, MD   END OF SESSION:   PT End of Session - 06/11/22 0803     Visit Number 4    Number of Visits 19    Date for PT Re-Evaluation 07/31/22    Authorization Type [place] MEDICAID [employer]    PT Start Time 0802    PT Stop Time 0836    PT Time Calculation (min) 34 min             Past Medical History:  Diagnosis Date   Allergic rhinitis    Arthritis    shoulder, neck, back   Asthma    Chronic back pain    GAD (generalized anxiety disorder)    GERD (gastroesophageal reflux disease)    History of asthma    child   Hyperlipidemia    Hypertension    MDD (major depressive disorder)    OSA on CPAP    07-15-2020  per pt uses approx. 3 times weekly   Peripheral neuropathy    Pre-diabetes    Rotator cuff tear, right    Wears glasses    Past Surgical History:  Procedure Laterality Date   COLONOSCOPY WITH PROPOFOL  05/31/2020   LUMBAR SPINE SURGERY  11/2014   SHOULDER ARTHROSCOPY WITH ROTATOR CUFF REPAIR AND SUBACROMIAL DECOMPRESSION Right 07/16/2020   Procedure: SHOULDER ARTHROSCOPY WITH ROTATOR CUFF REPAIR AND SUBACROMIAL DECOMPRESSION;  Surgeon: Renette Butters, MD;  Location: [place];  Service: Orthopedics;  Laterality: Right;   SHOULDER ARTHROSCOPY WITH ROTATOR CUFF REPAIR AND SUBACROMIAL DECOMPRESSION Right 05/05/2022   Procedure: SHOULDER ARTHROSCOPY WITH ROTATOR CUFF REPAIR AND SUBACROMIAL DECOMPRESSION. LOOSE BODYEXCISION;  Surgeon: Renette Butters, MD;  Location: [facility];  Service: Orthopedics;  Laterality: Right;   Patient Active Problem List   Diagnosis Date Noted   Body mass index (BMI) 32.0-32.9, adult 02/19/2021   Right shoulder pain 02/19/2021   Neck pain 11/21/2020   DDD (degenerative disc  disease), lumbosacral 10/20/2019   TIA (transient ischemic attack) 06/02/2018   Hypertension 06/21/17   Complication of surgical procedure 06/03/2015   Displacement of lumbar intervertebral disc without myelopathy 08/27/2014   Lower back injury 12/07/2013    REFERRING DIAG: right shoulder RCR ,SAD loose body removal 05/05/22    THERAPY DIAG:  Acute pain of right shoulder  Stiffness of right shoulder, not elsewhere classified  Muscle weakness (generalized)  Rationale for Evaluation and Treatment Rehabilitation  SUBJECTIVE:  SUBJECTIVE STATEMENT:    PAIN:  Are you having pain? Yes: NPRS scale: 0/10 Pain location: R GH joint area laterally and anteriorly Pain description: just sore Aggravating factors: Certain movements Relieving factors: Rest, pain medication  PERTINENT HISTORY: Hx 07/16/20 for SHOULDER ARTHROSCOPY WITH ROTATOR CUFF REPAIR AND SUBACROMIAL DECOMPRESSION, chronic back pain, HTN, GAD, MMD, peripheral neuropathy   PRECAUTIONS: Shoulder PROM only for 4 weeks, prescription written 05/15/22   WEIGHT BEARING RESTRICTIONS: Yes NWB   PATIENT GOALS:to have good ROM and use of my R arm with no pain NEXT MD VISIT: Middle of the month   OBJECTIVE: (objective measures completed at initial evaluation unless otherwise dated)   DIAGNOSTIC FINDINGS:  06/18/20/MRI R shoulder in Epic   PATIENT SURVEYS:  UEFS 25/40   COGNITION: Overall cognitive status: Within functional limits for tasks assessed   OBSERVATION:            Healed incision with min. scar tissue                                  SENSATION: WFL   POSTURE: Forward head and rounded shoulders   UPPER EXTREMITY ROM:    Passive ROM Right eval Left eval Rt 06/03/22 Right 06/11/22  Shoulder flexion 120 158 140 145  Shoulder  extension  155    Shoulder abduction  100   125   Shoulder adduction        Shoulder internal rotation        Shoulder external rotation 16 50 40   Elbow flexion        Elbow extension        Wrist flexion        Wrist extension        Wrist ulnar deviation        Wrist radial deviation        Wrist pronation        Wrist supination        (Blank rows = not tested)   UPPER EXTREMITY MMT:                       Not tested MMT Right eval Left eval  Shoulder flexion      Shoulder extension      Shoulder abduction      Shoulder adduction      Shoulder internal rotation      Shoulder external rotation      Middle trapezius      Lower trapezius      Elbow flexion      Elbow extension      Wrist flexion      Wrist extension      Wrist ulnar deviation      Wrist radial deviation      Wrist pronation      Wrist supination      Grip strength (lbs)      (Blank rows = not tested)   SHOULDER SPECIAL TESTS: Impingement tests:  NT SLAP lesions:  NT Instability tests:  NT Rotator cuff assessment:  NT Biceps assessment:  NT   JOINT MOBILITY TESTING:  NT   PALPATION:  TTP of the R [place] jt area, most significantly laterally and anteriorly             TODAY'S TREATMENT: [facility] Adult PT Treatment:  DATE: 06/11/22 Therapeutic Exercise: Pendulums Standing table stretch-high mat table- walking back for shoulder flexion PROM Standing scap retrac x 20 Seated table slides right flexion Seated scaption table slides  Seated ER PROM using Table - lean overs 10 sec x 3  Supine short level pullovers- clasped hand PROM ER PROM using dowel  Supine chest press AAROM x 10 , pullovers x 10 (90 degrees and over ) Manual Therapy: PROM for R shoulder flex, and, ER   [facility] Adult PT Treatment:                                                DATE: 06/08/22 Therapeutic Exercise: Pendulums Standing table stretch-high mat table- walking back for  shoulder flexion PROM Standing scap retrac x 20 Seated table slides bilateral flexion Seated scaption table slides  Seated ER PROM using Table - lean overs  Supine short level pullovers- clasped hand PROM ER PROM using dowel  Manual Therapy: PROM for R shoulder flex, and, ER   [facility] Adult PT Treatment:                                                DATE: 06/03/22 Therapeutic Exercise: Seated scapular retractions 2x10 Pendulum flex/ext and abd/add 2x10 each HEP updated Manual Therapy: PROM for R shoulder for flex, abd, and ER and for the scapula                                                                                                                                     [facility] Adult PT Treatment:                                                DATE: 05/29/22 Therapeutic Exercise: Developed, instructed in, and completed therex as noted in HEP  Self Care: Sleeping positions for support for comfort  Massage for scar management Ice pack for pain/swelling management   PATIENT EDUCATION: Education details: Eval findings, POC, HEP, self care  [place] educated: Patient Education method: Explanation, Demonstration, Tactile cues, Verbal cues, and Handouts Education comprehension: verbalized understanding, returned demonstration, verbal cues required, and tactile cues required   HOME EXERCISE PROGRAM: PKP2PBMP Added 06/08/22 - Seated Bilateral Shoulder Flexion Towel Slide at Table Top  - 1 x daily - 7 x weekly - 1 sets - 10 reps - 10 hold - Seated Shoulder Scaption Slide at Table Top with Forearm in Neutral  - 1 x daily - 7 x weekly - 1 sets - 10 reps - 10 hold -  Supine Shoulder External Rotation with Dowel  - 1 x daily - 7 x weekly - 1 sets - 10 reps - 10 hold   ASSESSMENT:   CLINICAL IMPRESSION: Pt reports increased soreness as expected after last session. He denies pain today. Continued with mostly passive ROM. He will see MD for follow tomorrow and is hopeful to be allowed to  progress PT.    Pt tolerated PT today without adverse effects. Pt will continue to benefit from skilled PT to address impairments for improved function.   OBJECTIVE IMPAIRMENTS: decreased ROM, decreased strength, increased edema, impaired UE functional use, obesity, and heavy arm .    ACTIVITY LIMITATIONS: carrying, lifting, bathing, dressing, self feeding, reach over head, and caring for others   PARTICIPATION LIMITATIONS: meal prep, cleaning, laundry, and yard work   PERSONAL FACTORS: Fitness, Past/current experiences, Time since onset of injury/illness/exacerbation, and 1-2 comorbidities: prior R shoulder surgery, high BMI  are also affecting patient's functional outcome.   GOALS:   SHORT TERM GOALS: Target date: 06/19/22   Pt will be Ind in an initial HEP Baseline: initiated Goal status: Ongoing   2.  Increase R shoulder PROM to flexion 140d, abd 120d, ER 40d Baseline: see flow sheets Status: 06/03/22= see flow sheets Goal status: MET   3.  Pt will voice understanding of measures to assist in pain reduction Baseline: initiated Goal status: Ongoing   LONG TERM GOALS: Target date: 07/31/22   Pt will be Ind in a final HEP to maintain achieved LOF  Baseline: Initiated Goal status: INITIAL   2.  Increase R shoulder PROM to within 95% of the L shoulder for appropriate R shoulder function Baseline: See flow sheet Goal status: INITIAL   3.  Increase R shoulder AROM to within 90% of the L shoulder for appropriate R shoulder function for overhead activities Baseline: Unable- TBA Goal status: INITIAL   4.  The R shoulder will demonstrate 4+/5 strength to ADLs and household activities with little to no difficulty Baseline: Unable Goal status: INITIAL   5.  Improved UEFS to15/40 as indication of improved function Baseline: 25/40 Goal status: INITIAL   PLAN:   PT FREQUENCY: 2x/week   PT DURATION: 8 weeks   PLANNED INTERVENTIONS: Therapeutic exercises, Therapeutic activity,  Patient/Family education, Self Care, Joint mobilization, Dry Needling, Electrical stimulation, Cryotherapy, Moist heat, Taping, Vasopneumatic device, Ultrasound, Ionotophoresis 27m/ml Dexamethasone, Manual therapy, and Re-evaluation   PLAN FOR NEXT SESSION: Assess response to HEP; progress therex as indicated; use of modalities, manual therapy; and TPDN as indicated.   JHessie Diener PTA 06/11/22 8:38 AM Phone: [phone]: [phone]

## 2022-06-16 ENCOUNTER — Ambulatory Visit: Payer: Medicaid Other

## 2022-06-16 DIAGNOSIS — M25611 Stiffness of right shoulder, not elsewhere classified: Secondary | ICD-10-CM

## 2022-06-16 DIAGNOSIS — M6281 Muscle weakness (generalized): Secondary | ICD-10-CM

## 2022-06-16 DIAGNOSIS — M25511 Pain in right shoulder: Secondary | ICD-10-CM | POA: Diagnosis not present

## 2022-06-16 NOTE — Therapy (Signed)
OUTPATIENT PHYSICAL THERAPY TREATMENT NOTE   Patient Name: Todd Ryan MRN: [id number] DOB:06/07/1961, 61 y.o., male Today's Date: 06/16/2022  PCP: Nolene Ebbs, MD  REFERRING PROVIDER: Renette Butters, MD   END OF SESSION:   PT End of Session - 06/16/22 1320     Visit Number 5    Number of Visits 19    Date for PT Re-Evaluation 07/31/22    Authorization Type [place] MEDICAID [employer]    PT Start Time 0935    PT Stop Time 1015    PT Time Calculation (min) 40 min    Activity Tolerance Patient tolerated treatment well    Behavior During Therapy WFL for tasks assessed/performed              Past Medical History:  Diagnosis Date   Allergic rhinitis    Arthritis    shoulder, neck, back   Asthma    Chronic back pain    GAD (generalized anxiety disorder)    GERD (gastroesophageal reflux disease)    History of asthma    child   Hyperlipidemia    Hypertension    MDD (major depressive disorder)    OSA on CPAP    07-15-2020  per pt uses approx. 3 times weekly   Peripheral neuropathy    Pre-diabetes    Rotator cuff tear, right    Wears glasses    Past Surgical History:  Procedure Laterality Date   COLONOSCOPY WITH PROPOFOL  05/31/2020   LUMBAR SPINE SURGERY  11/2014   SHOULDER ARTHROSCOPY WITH ROTATOR CUFF REPAIR AND SUBACROMIAL DECOMPRESSION Right 07/16/2020   Procedure: SHOULDER ARTHROSCOPY WITH ROTATOR CUFF REPAIR AND SUBACROMIAL DECOMPRESSION;  Surgeon: Renette Butters, MD;  Location: [place];  Service: Orthopedics;  Laterality: Right;   SHOULDER ARTHROSCOPY WITH ROTATOR CUFF REPAIR AND SUBACROMIAL DECOMPRESSION Right 05/05/2022   Procedure: SHOULDER ARTHROSCOPY WITH ROTATOR CUFF REPAIR AND SUBACROMIAL DECOMPRESSION. LOOSE BODYEXCISION;  Surgeon: Renette Butters, MD;  Location: [facility];  Service: Orthopedics;  Laterality: Right;   Patient Active Problem List   Diagnosis Date Noted   Body mass index  (BMI) 32.0-32.9, adult 02/19/2021   Right shoulder pain 02/19/2021   Neck pain 11/21/2020   DDD (degenerative disc disease), lumbosacral 10/20/2019   TIA (transient ischemic attack) 06/02/2018   Hypertension 53/79/4327   Complication of surgical procedure 06/03/2015   Displacement of lumbar intervertebral disc without myelopathy 08/27/2014   Lower back injury 12/07/2013    REFERRING DIAG: right shoulder RCR ,SAD loose body removal 05/05/22    THERAPY DIAG:  Acute pain of right shoulder  Stiffness of right shoulder, not elsewhere classified  Muscle weakness (generalized)  Rationale for Evaluation and Treatment Rehabilitation  SUBJECTIVE:  SUBJECTIVE STATEMENT: Pt reports Dr. Percell Miller has released him to rehab his shoulder on his own, but he would like   complete a few more session to feel confident in doing so. PAIN:  Are you having pain? Yes: NPRS scale: 0/10 Pain location: R GH joint area laterally and anteriorly Pain description: just sore Aggravating factors: Certain movements Relieving factors: Rest, pain medication  PERTINENT HISTORY: Hx 07/16/20 for SHOULDER ARTHROSCOPY WITH ROTATOR CUFF REPAIR AND SUBACROMIAL DECOMPRESSION, chronic back pain, HTN, GAD, MMD, peripheral neuropathy   PRECAUTIONS: Shoulder PROM only for 4 weeks, prescription written 05/15/22   WEIGHT BEARING RESTRICTIONS: Yes NWB   PATIENT GOALS:to have good ROM and use of my R arm with no pain NEXT MD VISIT: Middle of the month   OBJECTIVE: (objective measures completed at initial evaluation unless otherwise dated)   DIAGNOSTIC FINDINGS:  06/18/20/MRI R shoulder in Epic   PATIENT SURVEYS:  UEFS 25/40   COGNITION: Overall cognitive status: Within functional limits for tasks assessed   OBSERVATION:            Healed  incision with min. scar tissue                                  SENSATION: WFL   POSTURE: Forward head and rounded shoulders   UPPER EXTREMITY ROM:    Passive ROM Right eval Left eval Rt 06/03/22 Right 06/11/22 Rt 05/1922  Shoulder flexion 120 158 140 145 155  Shoulder extension  155   155  Shoulder abduction  100   125    Shoulder adduction         Shoulder internal rotation         Shoulder external rotation 16 50 40    Elbow flexion         Elbow extension         Wrist flexion         Wrist extension         Wrist ulnar deviation         Wrist radial deviation         Wrist pronation         Wrist supination         (Blank rows = not tested)   UPPER EXTREMITY MMT:                       Not tested MMT Right eval Left eval  Shoulder flexion      Shoulder extension      Shoulder abduction      Shoulder adduction      Shoulder internal rotation      Shoulder external rotation      Middle trapezius      Lower trapezius      Elbow flexion      Elbow extension      Wrist flexion      Wrist extension      Wrist ulnar deviation      Wrist radial deviation      Wrist pronation      Wrist supination      Grip strength (lbs)      (Blank rows = not tested)   SHOULDER SPECIAL TESTS: Impingement tests:  NT SLAP lesions:  NT Instability tests:  NT Rotator cuff assessment:  NT Biceps assessment:  NT   JOINT MOBILITY TESTING:  NT   PALPATION:  TTP of the R [place] jt area, most significantly laterally and anteriorly             TODAY'S TREATMENT: [facility] Adult PT Treatment:                                                DATE: 06/16/22 Therapeutic Exercise: UBE 3 mins. 1.5 mins each direction Shoulder row RTB 2x10 Shoulder ext RTB 2x10 R shoulder IR YTB 2x10 R shoulder ER YTB 2x10 Supine shoulder flexion/ext c dowel 2x10 SL R shoulder abd 2x10 HEP updated   [facility] Adult PT Treatment:                                                DATE: 06/11/22 Therapeutic  Exercise: Pendulums Standing table stretch-high mat table- walking back for shoulder flexion PROM Standing scap retrac x 20 Seated table slides right flexion Seated scaption table slides  Seated ER PROM using Table - lean overs 10 sec x 3  Supine short level pullovers- clasped hand PROM ER PROM using dowel  Supine chest press AAROM x 10 , pullovers x 10 (90 degrees and over ) Manual Therapy: PROM for R shoulder flex, and, ER   [facility] Adult PT Treatment:                                                DATE: 06/08/22 Therapeutic Exercise: Pendulums Standing table stretch-high mat table- walking back for shoulder flexion PROM Standing scap retrac x 20 Seated table slides bilateral flexion Seated scaption table slides  Seated ER PROM using Table - lean overs  Supine short level pullovers- clasped hand PROM ER PROM using dowel  Manual Therapy: PROM for R shoulder flex, and, ER   PATIENT EDUCATION: Education details: Eval findings, POC, HEP, self care  [place] educated: Patient Education method: Explanation, Demonstration, Tactile cues, Verbal cues, and Handouts Education comprehension: verbalized understanding, returned demonstration, verbal cues required, and tactile cues required   HOME EXERCISE PROGRAM: Access Code: [email or web address] URL: [email or web address] Date: 06/16/2022 Prepared by: Gar Ponto  Exercises - Flexion-Extension Shoulder Pendulum with Table Support  - 2 x daily - 7 x weekly - 1 sets - 20 reps - Horizontal Shoulder Pendulum with Table Support  - 2 x daily - 7 x weekly - 1 sets - 20 reps - Seated Scapular Retraction  - 1 x daily - 7 x weekly - 2 sets - 10 reps - 3 hold - Seated Bilateral Shoulder Flexion Towel Slide at Table Top  - 1 x daily - 7 x weekly - 1 sets - 10 reps - 10 hold - Seated Shoulder Scaption Slide at Table Top with Forearm in Neutral  - 1 x daily - 7 x weekly - 1 sets - 10 reps - 10 hold - Supine Shoulder External Rotation with Dowel  -  1 x daily - 7 x weekly - 1 sets - 10 reps - 10 hold - Standing Shoulder Row with Anchored Resistance  - 1 x daily - 7 x weekly - 2 sets - 10 reps - 3 hold -  Shoulder extension with resistance - Neutral  - 1 x daily - 7 x weekly - 2 sets - 10 reps - 3 hold - Shoulder External Rotation with Anchored Resistance (Mirrored)  - 1 x daily - 7 x weekly - 2 sets - 10 reps - 3 hold - Shoulder Internal Rotation with Resistance  - 1 x daily - 7 x weekly - 2 sets - 10 reps - 3 hold - Supine Shoulder Flexion with Dowel  - 1 x daily - 7 x weekly - 2 sets - 10 reps - 3 hold - Sidelying Shoulder Abduction Palm Forward  - 1 x daily - 7 x weekly - 2 sets - 10 reps - 3 hold   ASSESSMENT:   CLINICAL IMPRESSION: Assessed pt's R shoulder AROM and it was found increased in comparison to PROM measures from last week and are meeting the set goals. PT was completed to initiate low intenity RC and periscapular strengthening therex. Pt tolerated PT today without adverse effects.Will assess response to the new therex the next session.   OBJECTIVE IMPAIRMENTS: decreased ROM, decreased strength, increased edema, impaired UE functional use, obesity, and heavy arm .    ACTIVITY LIMITATIONS: carrying, lifting, bathing, dressing, self feeding, reach over head, and caring for others   PARTICIPATION LIMITATIONS: meal prep, cleaning, laundry, and yard work   PERSONAL FACTORS: Fitness, Past/current experiences, Time since onset of injury/illness/exacerbation, and 1-2 comorbidities: prior R shoulder surgery, high BMI  are also affecting patient's functional outcome.   GOALS:   SHORT TERM GOALS: Target date: 06/19/22   Pt will be Ind in an initial HEP Baseline: initiated Goal status: MET   2.  Increase R shoulder PROM to flexion 140d, abd 120d, ER 40d Baseline: see flow sheets Status: 06/03/22= see flow sheets Goal status: MET   3.  Pt will voice understanding of measures to assist in pain reduction Baseline:  initiated Goal status: MET- to use cold   LONG TERM GOALS: Target date: 07/31/22   Pt will be Ind in a final HEP to maintain achieved LOF  Baseline: Initiated Goal status: ongoing   2.  Increase R shoulder PROM to within 95% of the L shoulder for appropriate R shoulder function Baseline: See flow sheet Status: 06/16/22=see flow sheets Goal status: MET   3.  Increase R shoulder AROM to within 90% of the L shoulder for appropriate R shoulder function for overhead activities Baseline: Unable- TBA Status: 06/16/22= see flow sheets Goal status: MET   4.  The R shoulder will demonstrate 4+/5 strength to ADLs and household activities with little to no difficulty Baseline: Unable Goal status: INITIAL   5.  Improved UEFS to15/40 as indication of improved function Baseline: 25/40 Goal status: INITIAL   PLAN:   PT FREQUENCY: 2x/week   PT DURATION: 8 weeks   PLANNED INTERVENTIONS: Therapeutic exercises, Therapeutic activity, Patient/Family education, Self Care, Joint mobilization, Dry Needling, Electrical stimulation, Cryotherapy, Moist heat, Taping, Vasopneumatic device, Ultrasound, Ionotophoresis 53m/ml Dexamethasone, Manual therapy, and Re-evaluation   PLAN FOR NEXT SESSION: Assess response to HEP; progress therex as indicated; use of modalities, manual therapy; and TPDN as indicated. Assess response to strengthening therex.   Brookelle Pellicane MS, PT 06/16/22 1:26 PM

## 2022-06-18 ENCOUNTER — Ambulatory Visit: Payer: Medicaid Other | Admitting: Physical Therapy

## 2022-06-18 ENCOUNTER — Encounter: Payer: Self-pay | Admitting: Physical Therapy

## 2022-06-18 DIAGNOSIS — M6281 Muscle weakness (generalized): Secondary | ICD-10-CM

## 2022-06-18 DIAGNOSIS — M25511 Pain in right shoulder: Secondary | ICD-10-CM | POA: Diagnosis not present

## 2022-06-18 DIAGNOSIS — M25611 Stiffness of right shoulder, not elsewhere classified: Secondary | ICD-10-CM

## 2022-06-18 NOTE — Therapy (Signed)
OUTPATIENT PHYSICAL THERAPY TREATMENT NOTE   Patient Name: Todd Ryan MRN: [id number] DOB:Dec 12, 1960, 61 y.o., male Today's Date: 06/18/2022  PCP: Nolene Ebbs, MD  REFERRING PROVIDER: Renette Butters, MD   END OF SESSION:   PT End of Session - 06/18/22 0723     Visit Number 6    Number of Visits 19    Date for PT Re-Evaluation 07/31/22    Authorization Type [place] MEDICAID [employer]    Authorization - Number of Visits 62    PT Start Time 0718    PT Stop Time 0756    PT Time Calculation (min) 38 min              Past Medical History:  Diagnosis Date   Allergic rhinitis    Arthritis    shoulder, neck, back   Asthma    Chronic back pain    GAD (generalized anxiety disorder)    GERD (gastroesophageal reflux disease)    History of asthma    child   Hyperlipidemia    Hypertension    MDD (major depressive disorder)    OSA on CPAP    07-15-2020  per pt uses approx. 3 times weekly   Peripheral neuropathy    Pre-diabetes    Rotator cuff tear, right    Wears glasses    Past Surgical History:  Procedure Laterality Date   COLONOSCOPY WITH PROPOFOL  05/31/2020   LUMBAR SPINE SURGERY  11/2014   SHOULDER ARTHROSCOPY WITH ROTATOR CUFF REPAIR AND SUBACROMIAL DECOMPRESSION Right 07/16/2020   Procedure: SHOULDER ARTHROSCOPY WITH ROTATOR CUFF REPAIR AND SUBACROMIAL DECOMPRESSION;  Surgeon: Renette Butters, MD;  Location: [place];  Service: Orthopedics;  Laterality: Right;   SHOULDER ARTHROSCOPY WITH ROTATOR CUFF REPAIR AND SUBACROMIAL DECOMPRESSION Right 05/05/2022   Procedure: SHOULDER ARTHROSCOPY WITH ROTATOR CUFF REPAIR AND SUBACROMIAL DECOMPRESSION. LOOSE BODYEXCISION;  Surgeon: Renette Butters, MD;  Location: [facility];  Service: Orthopedics;  Laterality: Right;   Patient Active Problem List   Diagnosis Date Noted   Body mass index (BMI) 32.0-32.9, adult 02/19/2021   Right shoulder pain 02/19/2021   Neck pain  11/21/2020   DDD (degenerative disc disease), lumbosacral 10/20/2019   TIA (transient ischemic attack) 06/02/2018   Hypertension 21/04/5519   Complication of surgical procedure 06/03/2015   Displacement of lumbar intervertebral disc without myelopathy 08/27/2014   Lower back injury 12/07/2013    REFERRING DIAG: right shoulder RCR ,SAD loose body removal 05/05/22    THERAPY DIAG:  Acute pain of right shoulder  Muscle weakness (generalized)  Stiffness of right shoulder, not elsewhere classified  Rationale for Evaluation and Treatment Rehabilitation  SUBJECTIVE:  SUBJECTIVE STATEMENT: No problems with the strengthening exercises.  PAIN:  Are you having pain? Yes: NPRS scale: 0/10 Pain location: R GH joint area laterally and anteriorly Pain description: just sore Aggravating factors: Certain movements Relieving factors: Rest, pain medication  PERTINENT HISTORY: Hx 07/16/20 for SHOULDER ARTHROSCOPY WITH ROTATOR CUFF REPAIR AND SUBACROMIAL DECOMPRESSION, chronic back pain, HTN, GAD, MMD, peripheral neuropathy   PRECAUTIONS: Shoulder PROM only for 4 weeks, prescription written 05/15/22   WEIGHT BEARING RESTRICTIONS: Yes NWB   PATIENT GOALS:to have good ROM and use of my R arm with no pain NEXT MD VISIT: Middle of the month   OBJECTIVE: (objective measures completed at initial evaluation unless otherwise dated)   DIAGNOSTIC FINDINGS:  06/18/20/MRI R shoulder in Epic   PATIENT SURVEYS:  UEFS 25/40   COGNITION: Overall cognitive status: Within functional limits for tasks assessed   OBSERVATION:            Healed incision with min. scar tissue                                  SENSATION: WFL   POSTURE: Forward head and rounded shoulders   UPPER EXTREMITY ROM:    Passive ROM Right eval  Left eval Rt 06/03/22 Right 06/11/22 Rt 05/1922  Shoulder flexion 120 158 140 145 155  Shoulder extension  155   155  Shoulder abduction  100   125    Shoulder adduction         Shoulder internal rotation         Shoulder external rotation 16 50 40    Elbow flexion         Elbow extension         Wrist flexion         Wrist extension         Wrist ulnar deviation         Wrist radial deviation         Wrist pronation         Wrist supination         (Blank rows = not tested)   UPPER EXTREMITY MMT:                       Not tested MMT Right eval Left eval  Shoulder flexion      Shoulder extension      Shoulder abduction      Shoulder adduction      Shoulder internal rotation      Shoulder external rotation      Middle trapezius      Lower trapezius      Elbow flexion      Elbow extension      Wrist flexion      Wrist extension      Wrist ulnar deviation      Wrist radial deviation      Wrist pronation      Wrist supination      Grip strength (lbs)      (Blank rows = not tested)   SHOULDER SPECIAL TESTS: Impingement tests:  NT SLAP lesions:  NT Instability tests:  NT Rotator cuff assessment:  NT Biceps assessment:  NT   JOINT MOBILITY TESTING:  NT   PALPATION:  TTP of the R [place] jt area, most significantly laterally and anteriorly  TODAY'S TREATMENT: [facility] Adult PT Treatment:                                                DATE: 06/18/22 Therapeutic Exercise: UBE 3 mins. 1.5 mins each direction Shoulder row GTB 2x10 Shoulder ext GTB 2x10 R shoulder IR RTB 2x10 R shoulder ER RTB 2x10 Standing wall slides for flexion Standing wall slides for abduction Chest press 3# on dowel Pullovers 3# on dowel  S/L Shoulder abduction 10 x 2  S/L Shoulder ER 10 x 2  Manual Therapy: Passive ROM flex , abdct, Er , IR   [facility] Adult PT Treatment:                                                DATE: 06/16/22 Therapeutic Exercise: UBE 3 mins. 1.5 mins each  direction Shoulder row RTB 2x10 Shoulder ext RTB 2x10 R shoulder IR YTB 2x10 R shoulder ER YTB 2x10 Supine shoulder flexion/ext c dowel 2x10 SL R shoulder abd 2x10 HEP updated   [place] Adult PT Treatment:                                                DATE: 06/11/22 Therapeutic Exercise: Pendulums Standing table stretch-high mat table- walking back for shoulder flexion PROM Standing scap retrac x 20 Seated table slides right flexion Seated scaption table slides  Seated ER PROM using Table - lean overs 10 sec x 3  Supine short level pullovers- clasped hand PROM ER PROM using dowel  Supine chest press AAROM x 10 , pullovers x 10 (90 degrees and over ) Manual Therapy: PROM for R shoulder flex, and, ER   [facility] Adult PT Treatment:                                                DATE: 06/08/22 Therapeutic Exercise: Pendulums Standing table stretch-high mat table- walking back for shoulder flexion PROM Standing scap retrac x 20 Seated table slides bilateral flexion Seated scaption table slides  Seated ER PROM using Table - lean overs  Supine short level pullovers- clasped hand PROM ER PROM using dowel  Manual Therapy: PROM for R shoulder flex, and, ER   PATIENT EDUCATION: Education details: Eval findings, POC, HEP, self care  [place] educated: Patient Education method: Explanation, Demonstration, Tactile cues, Verbal cues, and Handouts Education comprehension: verbalized understanding, returned demonstration, verbal cues required, and tactile cues required   HOME EXERCISE PROGRAM: Access Code: [email or web address] URL: [email or web address] Date: 06/16/2022 Prepared by: Gar Ponto  Exercises - Flexion-Extension Shoulder Pendulum with Table Support  - 2 x daily - 7 x weekly - 1 sets - 20 reps - Horizontal Shoulder Pendulum with Table Support  - 2 x daily - 7 x weekly - 1 sets - 20 reps - Seated Scapular Retraction  - 1 x daily - 7 x weekly - 2 sets - 10 reps - 3 hold -  Seated Bilateral Shoulder Flexion Towel Slide  at Table Top  - 1 x daily - 7 x weekly - 1 sets - 10 reps - 10 hold - Seated Shoulder Scaption Slide at Table Top with Forearm in Neutral  - 1 x daily - 7 x weekly - 1 sets - 10 reps - 10 hold - Supine Shoulder External Rotation with Dowel  - 1 x daily - 7 x weekly - 1 sets - 10 reps - 10 hold - Standing Shoulder Row with Anchored Resistance  - 1 x daily - 7 x weekly - 2 sets - 10 reps - 3 hold - Shoulder extension with resistance - Neutral  - 1 x daily - 7 x weekly - 2 sets - 10 reps - 3 hold - Shoulder External Rotation with Anchored Resistance (Mirrored)  - 1 x daily - 7 x weekly - 2 sets - 10 reps - 3 hold - Shoulder Internal Rotation with Resistance  - 1 x daily - 7 x weekly - 2 sets - 10 reps - 3 hold - Supine Shoulder Flexion with Dowel  - 1 x daily - 7 x weekly - 2 sets - 10 reps - 3 hold - Sidelying Shoulder Abduction Palm Forward  - 1 x daily - 7 x weekly - 2 sets - 10 reps - 3 hold   ASSESSMENT:   CLINICAL IMPRESSION: Pt reports no pain or soreness in shoulder after addition of strengthening. Progressed theraband resistance today with no increased pain. Manual performed to maximize ROM. Min tightness in Right external rotation.  . Pt tolerated PT today without adverse effects.Will assess response to the new therex the next session and continue to progress. Pt is interested in comprehensive HEP to complete shoulder rehab independently.    OBJECTIVE IMPAIRMENTS: decreased ROM, decreased strength, increased edema, impaired UE functional use, obesity, and heavy arm .    ACTIVITY LIMITATIONS: carrying, lifting, bathing, dressing, self feeding, reach over head, and caring for others   PARTICIPATION LIMITATIONS: meal prep, cleaning, laundry, and yard work   PERSONAL FACTORS: Fitness, Past/current experiences, Time since onset of injury/illness/exacerbation, and 1-2 comorbidities: prior R shoulder surgery, high BMI  are also affecting patient's  functional outcome.   GOALS:   SHORT TERM GOALS: Target date: 06/19/22   Pt will be Ind in an initial HEP Baseline: initiated Goal status: MET   2.  Increase R shoulder PROM to flexion 140d, abd 120d, ER 40d Baseline: see flow sheets Status: 06/03/22= see flow sheets Goal status: MET   3.  Pt will voice understanding of measures to assist in pain reduction Baseline: initiated Goal status: MET- to use cold   LONG TERM GOALS: Target date: 07/31/22   Pt will be Ind in a final HEP to maintain achieved LOF  Baseline: Initiated Goal status: ongoing   2.  Increase R shoulder PROM to within 95% of the L shoulder for appropriate R shoulder function Baseline: See flow sheet Status: 06/16/22=see flow sheets Goal status: MET   3.  Increase R shoulder AROM to within 90% of the L shoulder for appropriate R shoulder function for overhead activities Baseline: Unable- TBA Status: 06/16/22= see flow sheets Goal status: MET   4.  The R shoulder will demonstrate 4+/5 strength to ADLs and household activities with little to no difficulty Baseline: Unable Goal status: INITIAL   5.  Improved UEFS to15/40 as indication of improved function Baseline: 25/40 Goal status: INITIAL   PLAN:   PT FREQUENCY: 2x/week   PT DURATION: 8 weeks  PLANNED INTERVENTIONS: Therapeutic exercises, Therapeutic activity, Patient/Family education, Self Care, Joint mobilization, Dry Needling, Electrical stimulation, Cryotherapy, Moist heat, Taping, Vasopneumatic device, Ultrasound, Ionotophoresis 8m/ml Dexamethasone, Manual therapy, and Re-evaluation   PLAN FOR NEXT SESSION: develop advanced HEP as pt would like to DC to HEP ASAP perform independently.    JHessie Diener PTA 06/18/22 8:00 AM Phone: [phone]: [phone]

## 2022-06-25 ENCOUNTER — Ambulatory Visit: Payer: Medicaid Other

## 2022-06-25 DIAGNOSIS — M25511 Pain in right shoulder: Secondary | ICD-10-CM

## 2022-06-25 DIAGNOSIS — M6281 Muscle weakness (generalized): Secondary | ICD-10-CM

## 2022-06-25 DIAGNOSIS — M25611 Stiffness of right shoulder, not elsewhere classified: Secondary | ICD-10-CM

## 2022-06-25 NOTE — Therapy (Addendum)
OUTPATIENT PHYSICAL THERAPY TREATMENT NOTE/Discharge   Patient Name: Todd Ryan MRN: [id number] DOB:January 01, 1961, 61 y.o., male Today's Date: 06/25/2022  PCP: Nolene Ebbs, MD  REFERRING PROVIDER: Renette Butters, MD   END OF SESSION:   PT End of Session - 06/25/22 0906     Visit Number 7    Number of Visits 19    Date for PT Re-Evaluation 07/31/22    Authorization - Number of Visits 13    PT Start Time 0850    PT Stop Time 0930    PT Time Calculation (min) 40 min    Activity Tolerance Patient tolerated treatment well    Behavior During Therapy [facility] for tasks assessed/performed              Past Medical History:  Diagnosis Date   Allergic rhinitis    Arthritis    shoulder, neck, back   Asthma    Chronic back pain    GAD (generalized anxiety disorder)    GERD (gastroesophageal reflux disease)    History of asthma    child   Hyperlipidemia    Hypertension    MDD (major depressive disorder)    OSA on CPAP    07-15-2020  per pt uses approx. 3 times weekly   Peripheral neuropathy    Pre-diabetes    Rotator cuff tear, right    Wears glasses    Past Surgical History:  Procedure Laterality Date   COLONOSCOPY WITH PROPOFOL  05/31/2020   LUMBAR SPINE SURGERY  11/2014   SHOULDER ARTHROSCOPY WITH ROTATOR CUFF REPAIR AND SUBACROMIAL DECOMPRESSION Right 07/16/2020   Procedure: SHOULDER ARTHROSCOPY WITH ROTATOR CUFF REPAIR AND SUBACROMIAL DECOMPRESSION;  Surgeon: Renette Butters, MD;  Location: [place];  Service: Orthopedics;  Laterality: Right;   SHOULDER ARTHROSCOPY WITH ROTATOR CUFF REPAIR AND SUBACROMIAL DECOMPRESSION Right 05/05/2022   Procedure: SHOULDER ARTHROSCOPY WITH ROTATOR CUFF REPAIR AND SUBACROMIAL DECOMPRESSION. LOOSE BODYEXCISION;  Surgeon: Renette Butters, MD;  Location: [facility];  Service: Orthopedics;  Laterality: Right;   Patient Active Problem List   Diagnosis Date Noted   Body mass index (BMI)  32.0-32.9, adult 02/19/2021   Right shoulder pain 02/19/2021   Neck pain 11/21/2020   DDD (degenerative disc disease), lumbosacral 10/20/2019   TIA (transient ischemic attack) 06/02/2018   Hypertension 88/28/0034   Complication of surgical procedure 06/03/2015   Displacement of lumbar intervertebral disc without myelopathy 08/27/2014   Lower back injury 12/07/2013    REFERRING DIAG: right shoulder RCR ,SAD loose body removal 05/05/22    THERAPY DIAG:  Acute pain of right shoulder  Muscle weakness (generalized)  Stiffness of right shoulder, not elsewhere classified  Rationale for Evaluation and Treatment Rehabilitation  SUBJECTIVE:  SUBJECTIVE STATEMENT: Pt reports no problems with the strengthening exercises.. pt notes Are you having pain? Yes: NPRS scale: 0/10 Pain location: R GH joint area laterally and anteriorly Pain description: just sore Aggravating factors: Certain movements Relieving factors: Rest, pain medication  PERTINENT HISTORY: Hx 07/16/20 for SHOULDER ARTHROSCOPY WITH ROTATOR CUFF REPAIR AND SUBACROMIAL DECOMPRESSION, chronic back pain, HTN, GAD, MMD, peripheral neuropathy   PRECAUTIONS: Shoulder PROM only for 4 weeks, prescription written 05/15/22   WEIGHT BEARING RESTRICTIONS: Yes NWB   PATIENT GOALS:to have good ROM and use of my R arm with no pain NEXT MD VISIT: Middle of the month   OBJECTIVE: (objective measures completed at initial evaluation unless otherwise dated)   DIAGNOSTIC FINDINGS:  06/18/20/MRI R shoulder in Epic   PATIENT SURVEYS:  UEFS 25/40; 06/25/22=9/40 improved   COGNITION: Overall cognitive status: Within functional limits for tasks assessed   OBSERVATION:            Healed incision with min. scar tissue                                   SENSATION: WFL   POSTURE: Forward head and rounded shoulders   UPPER EXTREMITY ROM:    Passive ROM Right eval Left eval Rt 06/03/22 Right 06/11/22 Rt 05/1922  Shoulder flexion 120 158 140 145 155  Shoulder extension  155   155  Shoulder abduction  100   125    Shoulder adduction         Shoulder internal rotation         Shoulder external rotation 16 50 40    Elbow flexion         Elbow extension         Wrist flexion         Wrist extension         Wrist ulnar deviation         Wrist radial deviation         Wrist pronation         Wrist supination         (Blank rows = not tested)   UPPER EXTREMITY MMT:                       Not tested MMT Right 06/25/22 Left eval  Shoulder flexion  5-    Shoulder extension  5    Shoulder abduction  5-    Shoulder adduction  5-    Shoulder internal rotation  5    Shoulder external rotation  5-    Middle trapezius      Lower trapezius      Elbow flexion      Elbow extension      Wrist flexion      Wrist extension      Wrist ulnar deviation      Wrist radial deviation      Wrist pronation      Wrist supination      Grip strength (lbs)      (Blank rows = not tested)   SHOULDER SPECIAL TESTS: Impingement tests:  NT SLAP lesions:  NT Instability tests:  NT Rotator cuff assessment:  NT Biceps assessment:  NT   JOINT MOBILITY TESTING:  NT   PALPATION:  TTP of the R [place] jt area, most significantly laterally and anteriorly  TODAY'S TREATMENT: [facility] Adult PT Treatment:                                                DATE: 06/25/22 Therapeutic Exercise: UBE 3 mins. 1.5 mins each direction Shoulder row GTB 2x10 Shoulder ext GTB 2x10 R shoulder IR RTB 2x10 R shoulder ER RTB 2x10 Serratus punch GTB 2x10 Standing wall slides for flexion Standing wall slides for abduction Supine ER stretch c dowel S/L Shoulder abduction 10 x 2 3# S/L Shoulder ER 10 x 2 3# Chest press 3# on dowel Pullovers 3# on dowel   Updated HEP- discussed progression of therex c Tband and dumb bells  [facility] Adult PT Treatment:                                                DATE: 06/18/22 Therapeutic Exercise: UBE 3 mins. 1.5 mins each direction Shoulder row GTB 2x10 Shoulder ext GTB 2x10 R shoulder IR RTB 2x10 R shoulder ER RTB 2x10 Standing wall slides for flexion Standing wall slides for abduction Chest press 3# on dowel Pullovers 3# on dowel  S/L Shoulder abduction 10 x 2  S/L Shoulder ER 10 x 2  Manual Therapy: Passive ROM flex , abdct, Er , IR   [facility] Adult PT Treatment:                                                DATE: 06/16/22 Therapeutic Exercise: UBE 3 mins. 1.5 mins each direction Shoulder row RTB 2x10 Shoulder ext RTB 2x10 R shoulder IR YTB 2x10 R shoulder ER YTB 2x10 Supine shoulder flexion/ext c dowel 2x10 SL R shoulder abd 2x10 HEP updated    PATIENT EDUCATION: Education details: Eval findings, POC, HEP, self care  [place] educated: Patient Education method: Explanation, Demonstration, Tactile cues, Verbal cues, and Handouts Education comprehension: verbalized understanding, returned demonstration, verbal cues required, and tactile cues required   HOME EXERCISE PROGRAM: Access Code: [email or web address] URL: [email or web address] Date: 06/25/2022 Prepared by: Gar Ponto  Exercises - Flexion-Extension Shoulder Pendulum with Table Support  - 2 x daily - 7 x weekly - 1 sets - 20 reps - Horizontal Shoulder Pendulum with Table Support  - 2 x daily - 7 x weekly - 1 sets - 20 reps - Seated Scapular Retraction  - 1 x daily - 7 x weekly - 2 sets - 10 reps - 3 hold - Seated Bilateral Shoulder Flexion Towel Slide at Table Top  - 1 x daily - 7 x weekly - 1 sets - 10 reps - 10 hold - Seated Shoulder Scaption Slide at Table Top with Forearm in Neutral  - 1 x daily - 7 x weekly - 1 sets - 10 reps - 10 hold - Supine Shoulder External Rotation with Dowel  - 1 x daily - 7 x weekly - 1 sets - 10  reps - 10 hold - Standing Shoulder Row with Anchored Resistance  - 1 x daily - 7 x weekly - 2 sets - 10 reps - 3 hold - Shoulder extension with resistance - Neutral  - 1  x daily - 7 x weekly - 2 sets - 10 reps - 3 hold - Shoulder External Rotation with Anchored Resistance (Mirrored)  - 1 x daily - 7 x weekly - 2 sets - 10 reps - 3 hold - Shoulder Internal Rotation with Resistance  - 1 x daily - 7 x weekly - 2 sets - 10 reps - 3 hold - Supine Shoulder Flexion with Dowel  - 1 x daily - 7 x weekly - 2 sets - 10 reps - 3 hold - Sidelying Shoulder Abduction Palm Forward  - 1 x daily - 7 x weekly - 2 sets - 10 reps - 3 hold - Sidelying Shoulder External Rotation (Mirrored)  - 1 x daily - 7 x weekly - 2 sets - 10 reps - 3 hold - Standing Serratus Punch with Resistance  - 1 x daily - 7 x weekly - 2 sets - 10 reps - 3 hold   ASSESSMENT:   CLINICAL IMPRESSION: Pt completed his final PT session today for R RC and periscapular strengthening and shoulder AROM. A final HEP was completed with instruction in progression of resistance. Pt voiced understanding and returned demonstration. Pt has made very good progress with rehab meeting all set goals for ROM, strength, and function. Pt is to continue with his HEP to progress the function of his R shoulder. Pt is in agreement with DC at this time.    OBJECTIVE IMPAIRMENTS: decreased ROM, decreased strength, increased edema, impaired UE functional use, obesity, and heavy arm .    ACTIVITY LIMITATIONS: carrying, lifting, bathing, dressing, self feeding, reach over head, and caring for others   PARTICIPATION LIMITATIONS: meal prep, cleaning, laundry, and yard work   PERSONAL FACTORS: Fitness, Past/current experiences, Time since onset of injury/illness/exacerbation, and 1-2 comorbidities: prior R shoulder surgery, high BMI  are also affecting patient's functional outcome.   GOALS:   SHORT TERM GOALS: Target date: 06/19/22   Pt will be Ind in an initial  HEP Baseline: initiated Goal status: MET   2.  Increase R shoulder PROM to flexion 140d, abd 120d, ER 40d Baseline: see flow sheets Status: 06/03/22= see flow sheets Goal status: MET   3.  Pt will voice understanding of measures to assist in pain reduction Baseline: initiated Goal status: MET- to use cold   LONG TERM GOALS: Target date: 07/31/22   Pt will be Ind in a final HEP to maintain achieved LOF  Baseline: Initiated Goal status: MET    2.  Increase R shoulder PROM to within 95% of the L shoulder for appropriate R shoulder function Baseline: See flow sheet Status: 06/16/22=see flow sheets Goal status: MET   3.  Increase R shoulder AROM to within 90% of the L shoulder for appropriate R shoulder function for overhead activities Baseline: Unable- TBA Status: 06/16/22= see flow sheets Goal status: MET   4.  The R shoulder will demonstrate 4+/5 strength to ADLs and household activities with little to no difficulty Baseline: Unable Status: see flow sheets Goal status: MET   5.  Improved UEFS to15/40 as indication of improved function Baseline: 25/40 Status: 9/40 Goal status: MET   PLAN:   PT FREQUENCY: 2x/week   PT DURATION: 8 weeks   PLANNED INTERVENTIONS: Therapeutic exercises, Therapeutic activity, Patient/Family education, Self Care, Joint mobilization, Dry Needling, Electrical stimulation, Cryotherapy, Moist heat, Taping, Vasopneumatic device, Ultrasound, Ionotophoresis 63m/ml Dexamethasone, Manual therapy, and Re-evaluation   PLAN FOR NEXT SESSION:  PHYSICAL THERAPY DISCHARGE SUMMARY  [facility]  Current functional level related to goals / functional outcomes: See clinical impression and PT goals    Remaining deficits: See clinical impression and PT goals    Education / Equipment: HEP   Patient agrees to discharge. Patient goals were met. Patient is being discharged due to being pleased with the current functional level.    Harjot Dibello MS, PT 06/25/22 10:09 AM

## 2022-07-06 ENCOUNTER — Ambulatory Visit: Payer: Medicaid Other | Admitting: Physical Therapy

## 2022-07-13 ENCOUNTER — Encounter: Payer: Medicaid Other | Admitting: Physical Therapy

## 2022-07-20 ENCOUNTER — Encounter: Payer: Medicaid Other | Admitting: Physical Therapy

## 2023-05-17 ENCOUNTER — Other Ambulatory Visit: Payer: Self-pay | Admitting: Neurosurgery

## 2023-05-17 DIAGNOSIS — M961 Postlaminectomy syndrome, not elsewhere classified: Secondary | ICD-10-CM

## 2023-06-03 ENCOUNTER — Encounter: Payer: Self-pay | Admitting: Neurosurgery

## 2023-06-09 ENCOUNTER — Ambulatory Visit
Admission: RE | Admit: 2023-06-09 | Discharge: 2023-06-09 | Disposition: A | Discharge status: Home / Self Care | Payer: Medicaid Other | Source: Ambulatory Visit | Attending: Neurosurgery | Admitting: Neurosurgery

## 2023-06-09 DIAGNOSIS — M961 Postlaminectomy syndrome, not elsewhere classified: Secondary | ICD-10-CM

## 2023-06-14 NOTE — Therapy (Signed)
OUTPATIENT PHYSICAL THERAPY LOWER EXTREMITY EVALUATION   Patient Name: Todd Ryan MRN: [id number] DOB:1960-12-05, 62 y.o., male Today's Date: 06/15/2023  END OF SESSION:  PT End of Session - 06/15/23 0722     Visit Number 1    Number of Visits 7    Date for PT Re-Evaluation 08/06/23    Authorization Type [place] MEDICAID [employer]    Authorization - Number of Visits 27    PT Start Time 0717    PT Stop Time 0800    PT Time Calculation (min) 43 min    Activity Tolerance No increased pain    Behavior During Therapy WFL for tasks assessed/performed             Past Medical History:  Diagnosis Date   Allergic rhinitis    Arthritis    shoulder, neck, back   Asthma    Chronic back pain    GAD (generalized anxiety disorder)    GERD (gastroesophageal reflux disease)    History of asthma    child   Hyperlipidemia    Hypertension    MDD (major depressive disorder)    OSA on CPAP    07-15-2020  per pt uses approx. 3 times weekly   Peripheral neuropathy    Pre-diabetes    Rotator cuff tear, right    Wears glasses    Past Surgical History:  Procedure Laterality Date   COLONOSCOPY WITH PROPOFOL  05/31/2020   LUMBAR SPINE SURGERY  11/2014   SHOULDER ARTHROSCOPY WITH ROTATOR CUFF REPAIR AND SUBACROMIAL DECOMPRESSION Right 07/16/2020   Procedure: SHOULDER ARTHROSCOPY WITH ROTATOR CUFF REPAIR AND SUBACROMIAL DECOMPRESSION;  Surgeon: Sheral Apley, MD;  Location: [facility];  Service: Orthopedics;  Laterality: Right;   SHOULDER ARTHROSCOPY WITH ROTATOR CUFF REPAIR AND SUBACROMIAL DECOMPRESSION Right 05/05/2022   Procedure: SHOULDER ARTHROSCOPY WITH ROTATOR CUFF REPAIR AND SUBACROMIAL DECOMPRESSION. LOOSE BODYEXCISION;  Surgeon: Sheral Apley, MD;  Location: [facility];  Service: Orthopedics;  Laterality: Right;   Patient Active Problem List   Diagnosis Date Noted   Body mass index (BMI) 32.0-32.9, adult 02/19/2021   Right  shoulder pain 02/19/2021   Neck pain 11/21/2020   DDD (degenerative disc disease), lumbosacral 10/20/2019   TIA (transient ischemic attack) 06/02/2018   Hypertension 06/02/2018   Complication of surgical procedure 06/03/2015   Displacement of lumbar intervertebral disc without myelopathy 08/27/2014   Lower back injury 12/07/2013    PCP: Fleet Contras, MD  REFERRING PROVIDER: Jasmine Awe, DO  REFERRING DIAG: L ANTERIOR KNEE PAIN   THERAPY DIAG:  Acute pain of left knee  Muscle weakness (generalized)  Difficulty in walking, not elsewhere classified  Rationale for Evaluation and Treatment: Rehabilitation  ONSET DATE: 2 months  SUBJECTIVE:   SUBJECTIVE STATEMENT: L knee has been hurting for over a month and the R knee has started hurting as well, more so than the L. Pt endorses pain with turning in bed, sit t/f standing, walking, and up/down steps.   PERTINENT HISTORY: Recent back surgery, asthma, arhtritis, R RC repair  PAIN:  Are you having pain? Yes:  L knee NPRS scale: 1/10. Range the week prior to eval:0-3/10   R knee NPRS scale: 3/10  Range the week prior to eval: 3-8/10 Pain location: medial patella to anterior jt line, bilat Pain description: ache, sharp Aggravating factors: turning in bed, sit t/f standing Relieving factors: bee venom ointment  PRECAUTIONS: None  RED FLAGS: None   WEIGHT BEARING RESTRICTIONS:  No  FALLS:  Has patient fallen in last 6 months? No  LIVING ENVIRONMENT: Lives with: lives with their family Lives in: House/apartment Stairs: Yes: Internal: 13 steps; on right going up and External: 5 steps; can reach both Has following equipment at home: None  OCCUPATION: Retired  PLOF: Independent  PATIENT GOALS: Would like to get back to walking or running, pain relief  NEXT MD VISIT: To be scheduled  OBJECTIVE:  Note: Objective measures were completed at Evaluation unless otherwise noted.  DIAGNOSTIC FINDINGS: Xrays look  fine  PATIENT SURVEYS:  FOTO: Perceived function   32%, predicted   53%   COGNITION: Overall cognitive status: Within functional limits for tasks assessed     SENSATION: WFL  EDEMA:  Present R knee > L  MUSCLE LENGTH: Hamstrings: Right 50 deg; Left 50 deg Thomas test: Right NT deg; Left NT deg  POSTURE:  lateral facing patella, bilat  PALPATION: TTP medial patella to patella tendon  LOWER EXTREMITY ROM:  Grossly WNLs Active ROM Right eval Left eval  Hip flexion    Hip extension    Hip abduction    Hip adduction    Hip internal rotation    Hip external rotation    Knee flexion    Knee extension    Ankle dorsiflexion    Ankle plantarflexion    Ankle inversion    Ankle eversion     (Blank rows = not tested)  LOWER EXTREMITY MMT:   MMT Right eval Left eval  Hip flexion 4+ 4+  Hip extension 4 4  Hip abduction 4+ 4+  Hip adduction    Hip internal rotation    Hip external rotation 4+ 4+  Knee flexion 5 5  Knee extension 5 5  Ankle dorsiflexion    Ankle plantarflexion    Ankle inversion    Ankle eversion     (Blank rows = not tested)  LOWER EXTREMITY SPECIAL TESTS:  Knee special tests: Anterior drawer test: negative, Posterior drawer test: negative, McMurray's test: negative, and Patellafemoral apprehension test: positive   FUNCTIONAL TESTS:  5 times sit to stand: TBA  GAIT: Distance walked: 200" Assistive device utilized: None Level of assistance: Complete Independence Comments: WNLs, lateral heel strike to medial toe off   TODAY'S TREATMENT:    [facility] Adult PT Treatment:                                                DATE: 06/15/23 Therapeutic Exercise: Developed, instructed in, and pt completed therex as noted in HEP                                                                                                                    PATIENT EDUCATION:  Education details: Eval findings, POC, HEP  [place] educated: Patient Education method:  Explanation, Demonstration, Tactile cues, Verbal cues, and Handouts Education  comprehension: verbalized understanding, returned demonstration, verbal cues required, and tactile cues required  HOME EXERCISE PROGRAM: Access Code: [email or web address] URL: [email or web address] Date: 06/15/2023 Prepared by: Joellyn Rued  Exercises - Supine Hamstring Stretch  - 1 x daily - 7 x weekly - 1 sets - 3 reps - 30 hold - Prone Quadriceps Stretch with Strap  - 1 x daily - 7 x weekly - 1 sets - 3 reps - 30 hold - Active Straight Leg Raise with Quad Set  - 1 x daily - 7 x weekly - 2-3 sets - 10-15 reps - 3 hold - Straight Leg Raise with External Rotation  - 1 x daily - 7 x weekly - 3 sets - 10 reps - Sidelying Hip Abduction  - 1 x daily - 7 x weekly - 2-3 sets - 10-15 reps - 3 hold - Clamshell with Resistance  - 1 x daily - 7 x weekly - 2-3 sets - 10-15 reps - 3 hold - Supine Bridge with Mini Swiss Ball Between Knees  - 1 x daily - 7 x weekly - 2-3 sets - 10-15 reps  ASSESSMENT:  CLINICAL IMPRESSION: Patient is a 61 y.o. male who was seen today for physical therapy evaluation and treatment for L ANTERIOR KNEE PAIN. Additionally, pt reports he has developed anterior R knee pain > than L. Pt presents with decreased hip strength and hamstring flexibility bilat. Pt will benefit from skilled PT 1w5 to address impairments to optimize knee function with less pain.   OBJECTIVE IMPAIRMENTS: decreased activity tolerance, difficulty walking, decreased strength, increased edema, obesity, and pain.   ACTIVITY LIMITATIONS: carrying, lifting, bending, sitting, standing, squatting, stairs, and locomotion level  PARTICIPATION LIMITATIONS: meal prep, cleaning, shopping, and community activity  PERSONAL FACTORS:  high  BMI are also affecting patient's functional outcome.   REHAB POTENTIAL: Good  CLINICAL DECISION MAKING: Stable/uncomplicated  EVALUATION COMPLEXITY: Low   GOALS:  SHORT TERM GOALS =  LTGs   LONG TERM GOALS: Target date: 08/06/23  Pt will be Ind in a final HEP to maintain achieved LOF  Baseline: started Goal status: INITIAL  2.  Pt will report a 75% improvement in knee pain for improved functional mobility and QOL Baseline: L=0-3/10; R 3-8/10 Goal status: INITIAL  3.  Bilat hip strength will increase to 5/5 for improved Knee/LE function Baseline: see flow sheet Goal status: INITIAL  4.  Improve 5xSTS by MCID of 5" as indication of improved functional mobility Baseline: TBA Goal status: INITIAL  5.  Pt's FOTO score will improved to the predicted value of 53% as indication of improved function  Baseline: 32% Goal status: INITIAL  6. Increase bilat hamstring flexibility to 60d or greater to minimize strain across the knee joints  Baseline: 50d  Goal Status: INITIAL  PLAN:  PT FREQUENCY: 1x/week  PT DURATION: 6 weeks  PLANNED INTERVENTIONS: 97164- PT Re-evaluation, 97110-Therapeutic exercises, 97530- Therapeutic activity, 97535- Self Care, 56213- Manual therapy, [id number]- Gait training, 97014- Electrical stimulation (unattended), [id number]- Electrical stimulation (manual), [id number]- Ultrasound, Patient/Family education, Stair training, Taping, Dry Needling, Joint mobilization, Cryotherapy, and Moist heat  PLAN FOR NEXT SESSION: Review FOTO; assess response to HEP; progress therex as indicated; use of modalities, manual therapy; and TPDN as indicated.   Yavonne Kiss MS, PT 06/15/23 9:01 AM

## 2023-06-15 ENCOUNTER — Ambulatory Visit: Payer: Medicaid Other | Attending: Orthopedic Surgery

## 2023-06-15 ENCOUNTER — Other Ambulatory Visit: Payer: Self-pay

## 2023-06-15 DIAGNOSIS — M25562 Pain in left knee: Secondary | ICD-10-CM | POA: Diagnosis present

## 2023-06-15 DIAGNOSIS — M6281 Muscle weakness (generalized): Secondary | ICD-10-CM | POA: Diagnosis present

## 2023-06-15 DIAGNOSIS — R262 Difficulty in walking, not elsewhere classified: Secondary | ICD-10-CM | POA: Diagnosis present

## 2023-06-28 ENCOUNTER — Encounter: Payer: Self-pay | Admitting: Physical Therapy

## 2023-06-28 ENCOUNTER — Ambulatory Visit: Payer: Medicaid Other | Admitting: Physical Therapy

## 2023-06-28 DIAGNOSIS — M25562 Pain in left knee: Secondary | ICD-10-CM | POA: Diagnosis not present

## 2023-06-28 DIAGNOSIS — M6281 Muscle weakness (generalized): Secondary | ICD-10-CM

## 2023-06-28 NOTE — Therapy (Addendum)
 OUTPATIENT PHYSICAL THERAPY LOWER EXTREMITY Treatment/DC   Patient Name: Todd Ryan MRN: [id number] DOB:10-23-1960, 62 y.o., male Today's Date: 06/28/2023  END OF SESSION:  PT End of Session - 06/28/23 0722     Visit Number 2    Number of Visits 7    Date for PT Re-Evaluation 08/06/23    Authorization Type [address] [place] MEDICAID [employer]    Authorization - Number of Visits 27    PT Start Time 0718    PT Stop Time 0756    PT Time Calculation (min) 38 min             Past Medical History:  Diagnosis Date   Allergic rhinitis    Arthritis    shoulder, neck, back   Asthma    Chronic back pain    GAD (generalized anxiety disorder)    GERD (gastroesophageal reflux disease)    History of asthma    child   Hyperlipidemia    Hypertension    MDD (major depressive disorder)    OSA on CPAP    07-15-2020  per pt uses approx. 3 times weekly   Peripheral neuropathy    Pre-diabetes    Rotator cuff tear, right    Wears glasses    Past Surgical History:  Procedure Laterality Date   COLONOSCOPY WITH PROPOFOL   05/31/2020   LUMBAR SPINE SURGERY  11/2014   SHOULDER ARTHROSCOPY WITH ROTATOR CUFF REPAIR AND SUBACROMIAL DECOMPRESSION Right 07/16/2020   Procedure: SHOULDER ARTHROSCOPY WITH ROTATOR CUFF REPAIR AND SUBACROMIAL DECOMPRESSION;  Surgeon: Beverley Evalene BIRCH, MD;  Location: [facility];  Service: Orthopedics;  Laterality: Right;   SHOULDER ARTHROSCOPY WITH ROTATOR CUFF REPAIR AND SUBACROMIAL DECOMPRESSION Right 05/05/2022   Procedure: SHOULDER ARTHROSCOPY WITH ROTATOR CUFF REPAIR AND SUBACROMIAL DECOMPRESSION. LOOSE BODYEXCISION;  Surgeon: Beverley Evalene BIRCH, MD;  Location: [facility];  Service: Orthopedics;  Laterality: Right;   Patient Active Problem List   Diagnosis Date Noted   Body mass index (BMI) 32.0-32.9, adult 02/19/2021   Right shoulder pain 02/19/2021   Neck pain 11/21/2020   DDD (degenerative disc disease), lumbosacral  10/20/2019   TIA (transient ischemic attack) 06/02/2018   Hypertension 06/02/2018   Complication of surgical procedure 06/03/2015   Displacement of lumbar intervertebral disc without myelopathy 08/27/2014   Lower back injury 12/07/2013    PCP: Shelda Atlas, MD  REFERRING PROVIDER: Beverley Evalene MATSU, DO  REFERRING DIAG: L ANTERIOR KNEE PAIN   THERAPY DIAG:  Acute pain of left knee  Muscle weakness (generalized)  Rationale for Evaluation and Treatment: Rehabilitation  ONSET DATE: 2 months  SUBJECTIVE:   SUBJECTIVE STATEMENT: Pt reports min compliance with HEP. Knees are not painful this morning. Pain is usually later in the day.   L knee has been hurting for over a month and the R knee has started hurting as well, more so than the L. Pt endorses pain with turning in bed, sit t/f standing, walking, and up/down steps.   PERTINENT HISTORY: Recent back surgery, asthma, arhtritis, R RC repair  PAIN:  Are you having pain? NO:  L knee NPRS scale: 0/10. Range the week prior to eval:0-3/10   R knee NPRS scale: 3/10  Range the week prior to eval: 3-8/10 Pain location: medial patella to anterior jt line, bilat Pain description: ache, sharp Aggravating factors: turning in bed, sit t/f standing Relieving factors: bee venom ointment  PRECAUTIONS: None  RED FLAGS: None   WEIGHT BEARING RESTRICTIONS: No  FALLS:  Has patient fallen in last 6 months? No  LIVING ENVIRONMENT: Lives with: lives with their family Lives in: House/apartment Stairs: Yes: Internal: 13 steps; on right going up and External: 5 steps; can reach both Has following equipment at home: None  OCCUPATION: Retired  PLOF: Independent  PATIENT GOALS: Would like to get back to walking or running, pain relief  NEXT MD VISIT: To be scheduled  OBJECTIVE:  Note: Objective measures were completed at Evaluation unless otherwise noted.  DIAGNOSTIC FINDINGS: Xrays look fine  PATIENT SURVEYS:  FOTO: Perceived  function   32%, predicted   53%   COGNITION: Overall cognitive status: Within functional limits for tasks assessed     SENSATION: WFL  EDEMA:  Present R knee > L  MUSCLE LENGTH: Hamstrings: Right 50 deg; Left 50 deg Thomas test: Right NT deg; Left NT deg  POSTURE: lateral facing patella, bilat  PALPATION: TTP medial patella to patella tendon  LOWER EXTREMITY ROM:  Grossly WNLs Active ROM Right eval Left eval  Hip flexion    Hip extension    Hip abduction    Hip adduction    Hip internal rotation    Hip external rotation    Knee flexion    Knee extension    Ankle dorsiflexion    Ankle plantarflexion    Ankle inversion    Ankle eversion     (Blank rows = not tested)  LOWER EXTREMITY MMT:   MMT Right eval Left eval  Hip flexion 4+ 4+  Hip extension 4 4  Hip abduction 4+ 4+  Hip adduction    Hip internal rotation    Hip external rotation 4+ 4+  Knee flexion 5 5  Knee extension 5 5  Ankle dorsiflexion    Ankle plantarflexion    Ankle inversion    Ankle eversion     (Blank rows = not tested)  LOWER EXTREMITY SPECIAL TESTS:  Knee special tests: Anterior drawer test: negative, Posterior drawer test: negative, McMurray's test: negative, and Patellafemoral apprehension test: positive   FUNCTIONAL TESTS:  5 times sit to stand: TBA 06/28/23: 5 x STS 15 sec without UE  GAIT: Distance walked: 200 Assistive device utilized: None Level of assistance: Complete Independence Comments: WNLs, lateral heel strike to medial toe off   TODAY'S TREATMENT:    [facility] Adult PT Treatment:                                                DATE: 06/28/23 Therapeutic Exercise: Rec Bike L 3 x 5 minutes  LAQ with with ball squeeze 10 x 2  SLR with QS 10 x 2 each 2#  SLR with ER 10 x 2 each 2#  Bridge with Ball Squeeze 10 x 2  Side lying BTB clam 15 x 2 each  Standing quad stretch bilateral for 30 sec  Hip abduction x 15 reps , 2 sets each     [facility] Adult PT Treatment:                                                 DATE: 06/15/23 Therapeutic Exercise: Developed, instructed in, and pt completed therex as noted in HEP  PATIENT EDUCATION:  Education details: Eval findings, POC, HEP  [place] educated: Patient Education method: Explanation, Demonstration, Tactile cues, Verbal cues, and Handouts Education comprehension: verbalized understanding, returned demonstration, verbal cues required, and tactile cues required  HOME EXERCISE PROGRAM: Access Code: [email or web address] URL: [email or web address] Date: 06/15/2023 Prepared by: Dasie Daft  Exercises - Supine Hamstring Stretch  - 1 x daily - 7 x weekly - 1 sets - 3 reps - 30 hold - Prone Quadriceps Stretch with Strap  - 1 x daily - 7 x weekly - 1 sets - 3 reps - 30 hold - Active Straight Leg Raise with Quad Set  - 1 x daily - 7 x weekly - 2-3 sets - 10-15 reps - 3 hold - Straight Leg Raise with External Rotation  - 1 x daily - 7 x weekly - 3 sets - 10 reps - Sidelying Hip Abduction  - 1 x daily - 7 x weekly - 2-3 sets - 10-15 reps - 3 hold - Clamshell with Resistance  - 1 x daily - 7 x weekly - 2-3 sets - 10-15 reps - 3 hold - Supine Bridge with Mini Swiss Ball Between Knees  - 1 x daily - 7 x weekly - 2-3 sets - 10-15 reps  ASSESSMENT:  CLINICAL IMPRESSION:  Pt arrives without pain, reports min compliance with HEP. 5 x STS 15 sec but with increased pain. Session focused on reviewing HEP with added resistance. Pt tolerated session well with fatigue.   EVAL: Patient is a 62 y.o. male who was seen today for physical therapy evaluation and treatment for L ANTERIOR KNEE PAIN. Additionally, pt reports he has developed anterior R knee pain > than L. Pt presents with decreased hip strength and hamstring flexibility bilat. Pt will benefit from skilled PT 1w5 to address impairments to optimize knee  function with less pain.   OBJECTIVE IMPAIRMENTS: decreased activity tolerance, difficulty walking, decreased strength, increased edema, obesity, and pain.   ACTIVITY LIMITATIONS: carrying, lifting, bending, sitting, standing, squatting, stairs, and locomotion level  PARTICIPATION LIMITATIONS: meal prep, cleaning, shopping, and community activity  PERSONAL FACTORS: high BMI are also affecting patient's functional outcome.   REHAB POTENTIAL: Good  CLINICAL DECISION MAKING: Stable/uncomplicated  EVALUATION COMPLEXITY: Low   GOALS:  SHORT TERM GOALS = LTGs   LONG TERM GOALS: Target date: 08/06/23  Pt will be Ind in a final HEP to maintain achieved LOF  Baseline: started Goal status: INITIAL  2.  Pt will report a 75% improvement in knee pain for improved functional mobility and QOL Baseline: L=0-3/10; R 3-8/10 Goal status: INITIAL  3.  Bilat hip strength will increase to 5/5 for improved Knee/LE function Baseline: see flow sheet Goal status: INITIAL  4.  Improve 5xSTS by MCID of 5 as indication of improved functional mobility Baseline: TBA 06/28/23: 15 sec without UE Goal status: INITIAL  5.  Pt's FOTO score will improved to the predicted value of 53% as indication of improved function  Baseline: 32% Goal status: INITIAL  6. Increase bilat hamstring flexibility to 60d or greater to minimize strain across the knee joints  Baseline: 50d  Goal Status: INITIAL  PLAN:  PT FREQUENCY: 1x/week  PT DURATION: 6 weeks  PLANNED INTERVENTIONS: 97164- PT Re-evaluation, 97110-Therapeutic exercises, 97530- Therapeutic activity, 97535- Self Care, 02859- Manual therapy, [id number]- Gait training, 97014- Electrical stimulation (unattended), [id number]- Electrical stimulation (manual), [id number]- Ultrasound, Patient/Family education, Stair training, Taping, Dry Needling, Joint mobilization, Cryotherapy, and Moist heat  PLAN FOR NEXT SESSION: Review FOTO;  assess response to HEP; progress therex as  indicated; use of modalities, manual therapy; and TPDN as indicated.   Harlene Persons, PTA 06/28/23 8:06 AM Phone: [phone] Fax: [phone]   PHYSICAL THERAPY DISCHARGE SUMMARY  Visits from Start of Care: 2  Current functional level related to goals / functional outcomes: See clinical impression and PT goals    Remaining deficits: See clinical impression and PT goals    Education / Equipment: HEP   Patient agrees to discharge. Patient goals were not met. Patient is being discharged due to not returning since the last visit.   Allen Ralls MS, PT 02/01/24 1:26 PM

## 2023-07-14 ENCOUNTER — Ambulatory Visit: Payer: Medicaid Other | Admitting: Physical Therapy

## 2023-11-23 ENCOUNTER — Ambulatory Visit (INDEPENDENT_AMBULATORY_CARE_PROVIDER_SITE_OTHER)

## 2023-11-23 ENCOUNTER — Ambulatory Visit

## 2023-11-23 ENCOUNTER — Ambulatory Visit (HOSPITAL_COMMUNITY)
Admission: EM | Admit: 2023-11-23 | Discharge: 2023-11-23 | Disposition: A | Discharge status: Home / Self Care | Attending: Family Medicine | Admitting: Family Medicine

## 2023-11-23 ENCOUNTER — Encounter (HOSPITAL_COMMUNITY): Payer: Self-pay

## 2023-11-23 DIAGNOSIS — S90212A Contusion of left great toe with damage to nail, initial encounter: Secondary | ICD-10-CM

## 2023-11-23 DIAGNOSIS — M25562 Pain in left knee: Secondary | ICD-10-CM

## 2023-11-23 LAB — POCT FASTING CBG KUC MANUAL ENTRY: POCT Glucose (KUC): 117 mg/dL — AB (ref 70–99)

## 2023-11-23 MED ORDER — METHYLPREDNISOLONE 4 MG PO TBPK: ORAL_TABLET | ORAL | 0 refills | Status: DC

## 2023-11-23 NOTE — ED Provider Notes (Signed)
 [facility]   [id number] 11/23/23 Arrival Time: 0808  ASSESSMENT & PLAN:  1. Acute pain of left knee   2. Subungual hematoma of great toe of left foot, initial encounter    I have personally viewed and independently interpreted the imaging studies ordered this visit. LEFT knee: arthritic changes; no acute bony changes.  Trial of: New Prescriptions   METHYLPREDNISOLONE  (MEDROL  DOSEPAK) 4 MG TBPK TABLET    Take as directed.   Orders Placed This Encounter  Procedures   AMB referral to sports medicine    Referral Priority:   Routine    Referral Type:   Consultation    Referred to Provider:   Shauna Del, DO    Number of Visits Requested:   1   POC CBG monitoring    Standing Status:   Standing    Number of Occurrences:   1   Labs Reviewed  POCT FASTING CBG [facility] MANUAL ENTRY - Abnormal; Notable for the following components:      Result Value   POCT Glucose ([facility]) 117 (*)    All other components within normal limits   Work/school excuse note: not needed. Recommend:  Follow-up Information     Charle Congo, MD.   Specialty: Internal Medicine Why: As needed. Contact information: [address] [place] [address] [phone]                Activities as tolerated.  Reviewed expectations re: course of current medical issues. Questions answered. Outlined signs and symptoms indicating need for more acute intervention. Patient verbalized understanding. After Visit Summary given.  SUBJECTIVE: History from: patient. Todd Ryan is a 63 y.o. male who reports LEFT knee pain and occasional swelling; x 3 weeks; daily; wakes him at night; denies trauma/injury. H/O similar in left that has resolved. States using ice and taking meloxicam  with no relief. C/o LEFT great toe red/blue with numbness x2-3 days; sporadic tingling. Ambulatory without difficulty. Denies current extremity sensation changes or weakness.   Past Surgical History:  Procedure  Laterality Date   COLONOSCOPY WITH PROPOFOL   05/31/2020   LUMBAR SPINE SURGERY  11/2014   SHOULDER ARTHROSCOPY WITH ROTATOR CUFF REPAIR AND SUBACROMIAL DECOMPRESSION Right 07/16/2020   Procedure: SHOULDER ARTHROSCOPY WITH ROTATOR CUFF REPAIR AND SUBACROMIAL DECOMPRESSION;  Surgeon: Saundra Curl, MD;  Location: [facility];  Service: Orthopedics;  Laterality: Right;   SHOULDER ARTHROSCOPY WITH ROTATOR CUFF REPAIR AND SUBACROMIAL DECOMPRESSION Right 05/05/2022   Procedure: SHOULDER ARTHROSCOPY WITH ROTATOR CUFF REPAIR AND SUBACROMIAL DECOMPRESSION. LOOSE BODYEXCISION;  Surgeon: Saundra Curl, MD;  Location: [facility];  Service: Orthopedics;  Laterality: Right;      OBJECTIVE:  Vitals:   11/23/23 0900  BP: (!) 135/97  Pulse: 62  Resp: 18  Temp: 98.4 F (36.9 C)  TempSrc: Oral  SpO2: 98%    General appearance: alert; no distress HEENT: [place]; AT Neck: supple with FROM Resp: unlabored respirations Extremities: LLE: warm with well perfused appearance; poorly localized moderate tenderness over right anterior/superior/lateral knee; without gross deformities; swelling: minimal; bruising: none; knee ROM: normal; does have an aging subungual hematoma of LEFT great toe CV: brisk extremity capillary refill of LLE; 2+ DP pulse of LLE. Skin: warm and dry; no visible rashes Neurologic: gait normal; normal sensation and strength of LLE Psychological: alert and cooperative; normal mood and affect  Imaging: No results found.     Allergies  Allergen Reactions   Shellfish Allergy Nausea And Vomiting  Mussels only, can eat oysters    Past Medical History:  Diagnosis Date   Allergic rhinitis    Arthritis    shoulder, neck, back   Asthma    Chronic back pain    GAD (generalized anxiety disorder)    GERD (gastroesophageal reflux disease)    History of asthma    child   Hyperlipidemia    Hypertension    MDD (major depressive disorder)    OSA on  CPAP    07-15-2020  per pt uses approx. 3 times weekly   Peripheral neuropathy    Pre-diabetes    Rotator cuff tear, right    Wears glasses    Social History   Socioeconomic History   Marital status: Single    Spouse name: Not on file   Number of children: Not on file   Years of education: Not on file   Highest education level: Not on file  Occupational History   Not on file  Tobacco Use   Smoking status: Some Days    Types: Cigarettes   Smokeless tobacco: Never   Tobacco comments:    07-15-2020 per pt 1 ppmonth  Vaping Use   Vaping status: Never Used  Substance and Sexual Activity   Alcohol use: Not Currently    Alcohol/week: 0.0 standard drinks of alcohol    Comment: occas   Drug use: Never   Sexual activity: Yes  Other Topics Concern   Not on file  Social History Narrative   Not on file   Social Drivers of Health   Financial Resource Strain: Not on file  Food Insecurity: Not on file  Transportation Needs: Not on file  Physical Activity: Not on file  Stress: Not on file  Social Connections: Not on file   Family History  Problem Relation Age of Onset   Hypertension Mother    CAD Father    Colon cancer Neg Hx    Esophageal cancer Neg Hx    Rectal cancer Neg Hx    Stomach cancer Neg Hx    Past Surgical History:  Procedure Laterality Date   COLONOSCOPY WITH PROPOFOL   05/31/2020   LUMBAR SPINE SURGERY  11/2014   SHOULDER ARTHROSCOPY WITH ROTATOR CUFF REPAIR AND SUBACROMIAL DECOMPRESSION Right 07/16/2020   Procedure: SHOULDER ARTHROSCOPY WITH ROTATOR CUFF REPAIR AND SUBACROMIAL DECOMPRESSION;  Surgeon: Saundra Curl, MD;  Location: [facility];  Service: Orthopedics;  Laterality: Right;   SHOULDER ARTHROSCOPY WITH ROTATOR CUFF REPAIR AND SUBACROMIAL DECOMPRESSION Right 05/05/2022   Procedure: SHOULDER ARTHROSCOPY WITH ROTATOR CUFF REPAIR AND SUBACROMIAL DECOMPRESSION. LOOSE BODYEXCISION;  Surgeon: Saundra Curl, MD;  Location: [facility];  Service: Orthopedics;  Laterality: Right;       Afton Albright, MD 11/23/23 1036

## 2023-11-23 NOTE — ED Triage Notes (Signed)
 Pt c/o lt knee pain and swelling radiating down leg x3wks. States using ice and taking meloxicam  with no relief. C/o lt get toe red/blue with numbness x2-3 days.

## 2023-11-25 ENCOUNTER — Ambulatory Visit (HOSPITAL_COMMUNITY): Payer: Self-pay

## 2023-12-07 ENCOUNTER — Ambulatory Visit: Admitting: Sports Medicine

## 2024-01-18 ENCOUNTER — Ambulatory Visit: Admitting: Sports Medicine

## 2024-01-19 ENCOUNTER — Ambulatory Visit: Admitting: Sports Medicine

## 2024-01-19 ENCOUNTER — Other Ambulatory Visit (INDEPENDENT_AMBULATORY_CARE_PROVIDER_SITE_OTHER): Payer: Self-pay

## 2024-01-19 ENCOUNTER — Encounter: Payer: Self-pay | Admitting: Sports Medicine

## 2024-01-19 DIAGNOSIS — M25562 Pain in left knee: Secondary | ICD-10-CM

## 2024-01-19 DIAGNOSIS — G8929 Other chronic pain: Secondary | ICD-10-CM

## 2024-01-19 DIAGNOSIS — M25561 Pain in right knee: Secondary | ICD-10-CM | POA: Diagnosis not present

## 2024-01-19 DIAGNOSIS — M1712 Unilateral primary osteoarthritis, left knee: Secondary | ICD-10-CM | POA: Diagnosis not present

## 2024-01-19 MED ORDER — METHYLPREDNISOLONE ACETATE 40 MG/ML IJ SUSP
80.0000 mg | INTRAMUSCULAR | Status: AC | PRN
  Administered 2024-01-19: 80 mg via INTRA_ARTICULAR

## 2024-01-19 MED ORDER — BUPIVACAINE HCL 0.25 % IJ SOLN
2.0000 mL | INTRAMUSCULAR | Status: AC | PRN
  Administered 2024-01-19: 2 mL via INTRA_ARTICULAR

## 2024-01-19 MED ORDER — LIDOCAINE HCL 1 % IJ SOLN
2.0000 mL | INTRAMUSCULAR | Status: AC | PRN
  Administered 2024-01-19: 2 mL

## 2024-01-19 NOTE — Progress Notes (Signed)
 Todd Ryan - 63 y.o. male MRN [id number]  Date of birth: Aug 12, 1960  Office Visit Note: Visit Date: 01/19/2024 PCP: Shelda Atlas, MD Referred by: Rolinda Rogue, MD  Subjective: Chief Complaint  Patient presents with   Left Knee - Pain   HPI: Todd Ryan is a pleasant 63 y.o. male who presents today for evaluation of acute on chronic left knee pain.  The left knee has been bothering him for a few months.  He has pain both on the medial and lateral joint line, more so today is over the medial side.  He also reports some crepitus with flexion and extension.  He will have swelling in the knee, this is usually worse at the end of the day after being on his feet.  The swelling is only present on the left side.  He does have some right knee pain at times but this is more manageable.  He takes meloxicam  15 mg once daily every day for this and more so his low back pain but does not find much relief.  When he took oral prednisone  from urgent care, his swelling went away and the knee felt good but shortly after discontinuing this his pain and swelling returned.  He has performed formalized physical therapy for the shoulder/knees before but none recently, the last was back on 06/28/2023.  *Note reviewed from 11/23/2023 from [facility] urgent care, saw Dr. Rolinda.  Diagnosed with acute pain of left knee.  Did have x-rays that showed mild arthritic changes.  He was prescribed a Medrol  Dosepak and referred here for further management.  Pertinent ROS were reviewed with the patient and found to be negative unless otherwise specified above in HPI.   Assessment & Plan: Visit Diagnoses:  1. Unilateral primary osteoarthritis, left knee   2. Chronic pain of both knees   3. Chronic patellofemoral pain of left knee    Plan: Impression is chronic left > right knee pain with mild osteoarthritic changes in the left knee with patellofemoral arthralgia.  The left knee does have a small effusion, cannot  rule out degenerative meniscal changes versus OA flare.  Through shared decision-making, we did proceed with knee corticosteroid injection, patient tolerated well.  Advised on postinjection protocol.  I would like to get him started in formalized physical therapy to work on knee stability and patellofemoral stabilization.  He may continue his meloxicam  15 mg once daily for this and his low back pain.  At some point in the future we could consider transitioning to a different NSAID if not finding beneficial, but we will hold on this for now and see his response to CSI.  He will follow-up in 6 weeks for reevaluation.  Follow-up: Return in about 6 weeks (around 03/01/2024) for L knee .   Meds & Orders: No orders of the defined types were placed in this encounter.   Orders Placed This Encounter  Procedures   Large Joint Inj: R knee   XR Knee 1-2 Views Left   Ambulatory referral to Physical Therapy    Procedures: Large Joint Inj: L knee on 01/19/2024 9:32 AM Indications: pain and joint swelling Details: 22 G 1.5 in needle, anterolateral approach Medications: 2 mL lidocaine  1 %; 2 mL bupivacaine  0.25 %; 80 mg methylPREDNISolone  acetate 40 MG/ML Outcome: tolerated well, no immediate complications  Knee Injection, Left: After discussion on risks/benefits/indications, informed verbal consent was obtained and a timeout was performed, patient was seated on exam table. The patient's knee was  prepped with Betadine  and alcohol swab and utilizing anterolateral approach, the patient's knee was injected intraarticularly with 2:2:2 lidocaine  1%:bupivicaine 0.25%:depomedrol. Patient tolerated the procedure well without immediate complications.  Procedure, treatment alternatives, risks and benefits explained, specific risks discussed. Consent was given by the patient. Patient was prepped and draped in the usual sterile fashion.          Clinical History: No specialty comments available.  He reports that he  has been smoking cigarettes. He has never used smokeless tobacco. No results for input(s): HGBA1C, LABURIC in the last 8760 hours.  Objective:    Physical Exam  Gen: Well-appearing, in no acute distress; non-toxic CV: Well-perfused. Warm.  Resp: Breathing unlabored on room air; no wheezing. Psych: Fluid speech in conversation; appropriate affect; normal thought process  Ortho Exam - Bilateral knee: Left knee has a small effusion present, right knee without effusion.  There is left greater than right patellar crepitus bilaterally.  There is positive TTP over the medial joint line of the left knee range of motion is slightly restricted with pain at end range of motion, left knee 0-120 degrees, right knee 0-130 degrees.  Mild pain with Thessaly's testing on the left knee without reproducible click.  Imaging: XR Knee 1-2 Views Left Result Date: 01/19/2024 2 view x-ray of the left knee including bilateral standing AP and Cecillia views were ordered and reviewed by myself today.  X-rays demonstrate at least mild medial tibiofemoral joint space narrowing bilaterally.  The left knee does fall into a very slight valgus fashion.  No acute fracture noted.  Possibly small joint effusion noted of the left knee.   *Independent review and interpretation of 4 view left knee x-ray from 11/23/2023 was performed by myself today.  AP, obliques and lateral film were reviewed.  There is mild medial tibiofemoral joint space narrowing.  There is small anterior patellar spurring near the region of the distal quadricep tendon.  No joint effusion noted.  These are however non-weightbearing images.  DG Knee Complete 4 Views Left CLINICAL DATA:  Pain and swelling for 3 weeks.  EXAM: LEFT KNEE - COMPLETE 4+ VIEW  COMPARISON:  None Available.  FINDINGS: No evidence of acute fracture, dislocation, or sizable joint effusion. Mild medial compartment joint space narrowing. Soft tissues are grossly  unremarkable.  IMPRESSION: 1. No acute osseous abnormality. 2. Mild degenerative changes of the medial femorotibial compartment.  Electronically Signed   By: Harrietta Sherry M.D.   On: 11/23/2023 11:42    Past Medical/Family/Surgical/Social History: Medications & Allergies reviewed per EMR, new medications updated. Patient Active Problem List   Diagnosis Date Noted   Body mass index (BMI) 32.0-32.9, adult 02/19/2021   Right shoulder pain 02/19/2021   Neck pain 11/21/2020   DDD (degenerative disc disease), lumbosacral 10/20/2019   TIA (transient ischemic attack) 06/02/2018   Hypertension 06/02/2018   Complication of surgical procedure 06/03/2015   Displacement of lumbar intervertebral disc without myelopathy 08/27/2014   Lower back injury 12/07/2013   Past Medical History:  Diagnosis Date   Allergic rhinitis    Arthritis    shoulder, neck, back   Asthma    Chronic back pain    GAD (generalized anxiety disorder)    GERD (gastroesophageal reflux disease)    History of asthma    child   Hyperlipidemia    Hypertension    MDD (major depressive disorder)    OSA on CPAP    07-15-2020  per pt uses approx. 3 times weekly  Peripheral neuropathy    Pre-diabetes    Rotator cuff tear, right    Wears glasses    Family History  Problem Relation Age of Onset   Hypertension Mother    CAD Father    Colon cancer Neg Hx    Esophageal cancer Neg Hx    Rectal cancer Neg Hx    Stomach cancer Neg Hx    Past Surgical History:  Procedure Laterality Date   COLONOSCOPY WITH PROPOFOL   05/31/2020   LUMBAR SPINE SURGERY  11/2014   SHOULDER ARTHROSCOPY WITH ROTATOR CUFF REPAIR AND SUBACROMIAL DECOMPRESSION Right 07/16/2020   Procedure: SHOULDER ARTHROSCOPY WITH ROTATOR CUFF REPAIR AND SUBACROMIAL DECOMPRESSION;  Surgeon: Beverley Evalene BIRCH, MD;  Location: [facility];  Service: Orthopedics;  Laterality: Right;   SHOULDER ARTHROSCOPY WITH ROTATOR CUFF REPAIR AND SUBACROMIAL  DECOMPRESSION Right 05/05/2022   Procedure: SHOULDER ARTHROSCOPY WITH ROTATOR CUFF REPAIR AND SUBACROMIAL DECOMPRESSION. LOOSE BODYEXCISION;  Surgeon: Beverley Evalene BIRCH, MD;  Location: [facility];  Service: Orthopedics;  Laterality: Right;   Social History   Occupational History   Not on file  Tobacco Use   Smoking status: Some Days    Types: Cigarettes   Smokeless tobacco: Never   Tobacco comments:    07-15-2020 per pt 1 ppmonth  Vaping Use   Vaping status: Never Used  Substance and Sexual Activity   Alcohol use: Not Currently    Alcohol/week: 0.0 standard drinks of alcohol    Comment: occas   Drug use: Never   Sexual activity: Yes

## 2024-01-19 NOTE — Progress Notes (Signed)
 Patient says that his left knee has been bothering him for a few months now. He denies any injury to the knee, but says that he did have right knee pain initially that has only been occasionally bothersome since the left one began to hurt. He says that he has swelling everyday that is worse at the end of the day after being on his feet and active. He will sometimes have swelling in the left ankle at the end of the day, as well. He says that his pain starts over the outside of the knee and then wraps around the top. He does have some popping and clicking, but says it is not painful. He uses ice which helps with his swelling and pain temporarily. He has also wrapped the knee, which seems to help temporarily.

## 2024-01-27 ENCOUNTER — Ambulatory Visit: Attending: Sports Medicine

## 2024-01-27 ENCOUNTER — Other Ambulatory Visit: Payer: Self-pay

## 2024-01-27 DIAGNOSIS — G8929 Other chronic pain: Secondary | ICD-10-CM | POA: Insufficient documentation

## 2024-01-27 DIAGNOSIS — M6281 Muscle weakness (generalized): Secondary | ICD-10-CM | POA: Diagnosis present

## 2024-01-27 DIAGNOSIS — M25562 Pain in left knee: Secondary | ICD-10-CM | POA: Insufficient documentation

## 2024-01-27 DIAGNOSIS — M1712 Unilateral primary osteoarthritis, left knee: Secondary | ICD-10-CM | POA: Diagnosis not present

## 2024-01-27 DIAGNOSIS — M25561 Pain in right knee: Secondary | ICD-10-CM | POA: Insufficient documentation

## 2024-01-27 NOTE — Therapy (Signed)
 OUTPATIENT PHYSICAL THERAPY LOWER EXTREMITY EVALUATION   Patient Name: Todd Ryan MRN: [id number] DOB:10/02/1960, 63 y.o., male Today's Date: 01/27/2024  END OF SESSION:  PT End of Session - 01/27/24 0922     Visit Number 1    Number of Visits 9    Date for PT Re-Evaluation 03/23/24    PT Start Time 0913    PT Stop Time 0952    PT Time Calculation (min) 39 min    Activity Tolerance Patient tolerated treatment well    Behavior During Therapy [facility] for tasks assessed/performed          Past Medical History:  Diagnosis Date   Allergic rhinitis    Arthritis    shoulder, neck, back   Asthma    Chronic back pain    GAD (generalized anxiety disorder)    GERD (gastroesophageal reflux disease)    History of asthma    child   Hyperlipidemia    Hypertension    MDD (major depressive disorder)    OSA on CPAP    07-15-2020  per pt uses approx. 3 times weekly   Peripheral neuropathy    Pre-diabetes    Rotator cuff tear, right    Wears glasses    Past Surgical History:  Procedure Laterality Date   COLONOSCOPY WITH PROPOFOL   05/31/2020   LUMBAR SPINE SURGERY  11/2014   SHOULDER ARTHROSCOPY WITH ROTATOR CUFF REPAIR AND SUBACROMIAL DECOMPRESSION Right 07/16/2020   Procedure: SHOULDER ARTHROSCOPY WITH ROTATOR CUFF REPAIR AND SUBACROMIAL DECOMPRESSION;  Surgeon: Beverley Evalene BIRCH, MD;  Location: [facility];  Service: Orthopedics;  Laterality: Right;   SHOULDER ARTHROSCOPY WITH ROTATOR CUFF REPAIR AND SUBACROMIAL DECOMPRESSION Right 05/05/2022   Procedure: SHOULDER ARTHROSCOPY WITH ROTATOR CUFF REPAIR AND SUBACROMIAL DECOMPRESSION. LOOSE BODYEXCISION;  Surgeon: Beverley Evalene BIRCH, MD;  Location: [facility];  Service: Orthopedics;  Laterality: Right;   Patient Active Problem List   Diagnosis Date Noted   Body mass index (BMI) 32.0-32.9, adult 02/19/2021   Right shoulder pain 02/19/2021   Neck pain 11/21/2020   DDD (degenerative disc disease),  lumbosacral 10/20/2019   TIA (transient ischemic attack) 06/02/2018   Hypertension 06/02/2018   Complication of surgical procedure 06/03/2015   Displacement of lumbar intervertebral disc without myelopathy 08/27/2014   Lower back injury 12/07/2013    PCP: Shelda Atlas, MD   REFERRING PROVIDER: Burnetta Brunet, DO   REFERRING DIAG: M25.561,M25.562,G89.29 (ICD-10-CM) - Chronic pain of both knees  M17.12 (ICD-10-CM) - Unilateral primary osteoarthritis, left knee  M25.562,G89.29 (ICD-10-CM) - Chronic patellofemoral pain of left knee   THERAPY DIAG:  Chronic pain of left knee  Chronic pain of right knee  Muscle weakness (generalized)  Rationale for Evaluation and Treatment: Rehabilitation  ONSET DATE: Chronic  SUBJECTIVE:   SUBJECTIVE STATEMENT:  Pt reports to physical therapy for evaluation and treatment of Bilateral knee pain L > R. Imaging has revealed mild joint space narrowing of medial tibiofemoral joint bilaterally.   Pt states coming in L knee 7/10 R 2/10. R knee pain started 3-4 months ago L started 2-3 months ago. Pt reports that walking long distances and bending down and picking stuff up are his biggest deficits currently. Pt received injection about 2 weeks ago in L knee and it has helped him with his pain. Pt has responded well to PT in the past.  PERTINENT HISTORY: HTN Arthritis  PAIN:  Are you having pain? Yes: NPRS scale: L 7/10, R 2/10 Pain location: L and  R all across the knee  Pain description: sharp and shooting Aggravating factors: standing walking, sitting,  Relieving factors: rest, ice  PRECAUTIONS: None  RED FLAGS: None   WEIGHT BEARING RESTRICTIONS: No  FALLS:  Has patient fallen in last 6 months? No  LIVING ENVIRONMENT: Lives with: lives with their spouse 1 kid at home Lives in: House/apartment Stairs:  Has following equipment at home: None  OCCUPATION: part time exterminator   PLOF: Independent currently limited in some activities    PATIENT GOALS: get rid of pain  NEXT MD VISIT: 03/01/24  OBJECTIVE:  Note: Objective measures were completed at Evaluation unless otherwise noted.  DIAGNOSTIC FINDINGS: mild medial tibiofemoral joint space narrowing bilaterally   PATIENT SURVEYS:  LEFS: 21/80  COGNITION: Overall cognitive status: Within functional limits for tasks assessed     SENSATION: Not tested  EDEMA L knee swelling noted greater than right  MUSCLE LENGTH: Hamstrings: Right 40 deg ; Left 30 deg  POSTURE: No Significant postural limitations  PALPATION: TTP: R infra patella          L medial joint line, L infrapatella  LOWER EXTREMITY ROM:  Active ROM Right eval Left eval  Hip flexion    Hip extension    Hip abduction    Hip adduction    Hip internal rotation    Hip external rotation    Knee flexion 120 120  Knee extension 0 -2  Ankle dorsiflexion    Ankle plantarflexion    Ankle inversion    Ankle eversion     (Blank rows = not tested)  LOWER EXTREMITY MMT:  MMT Right eval Left eval  Hip flexion 4 4  Hip extension    Hip abduction 4 4  Hip adduction    Hip internal rotation    Hip external rotation    Knee flexion 5 5  Knee extension 5 5  Ankle dorsiflexion    Ankle plantarflexion    Ankle inversion    Ankle eversion     (Blank rows = not tested)  LOWER EXTREMITY SPECIAL TESTS:    FUNCTIONAL TESTS:  30 sec STS: 6 , increased back pain : 409 ft  GAIT: Distance walked: 457ft Assistive device utilized: None Level of assistance: Complete Independence Comments: none                                                                                                                                 TREATMENT DATE:   [facility] Adult PT Treatment:                                                DATE: 01/27/2024 Therapeutic Exercise: SLR x 10 ea SL Hip abduction x 10  Knee extension machine 20 lbs x 10 SL knee extension 10 lb x10 ea  Leg press 60# x15 Lateral walking RTB  x 10 ft down and back 5x BW squats at counter with cues to keep heels down x 8  Self Care: Educated on performance of HEP, importance of exercise and staying active to minimize pain     PATIENT EDUCATION:  Education details: Educated on eval findings, POC, HEP performance,  [place] educated: Patient Education method: Programmer, multimedia, Facilities manager, Verbal cues, and Handouts Education comprehension: verbalized understanding and returned demonstration  HOME EXERCISE PROGRAM: Access Code: [email or web address] URL: [email or web address] Date: 01/27/2024 Prepared by: Alm Kingdom  Exercises - Active Straight Leg Raise with Quad Set - 1 x daily - 7 x weekly - 3 sets - 10 reps - Sidelying Hip Abduction - 1 x daily - 7 x weekly - 3 sets - 10 reps - Knee Extension with Weight Machine - 1 x daily - 7 x weekly - 3 sets - 10 reps - 20lb hold - Full Leg Press - 1 x daily - 7 x weekly - 3 sets - 10 reps - 80lb hold - Side Stepping with Resistance at Ankles - 1 x daily - 7 x weekly - 5 sets - 62ft hold - Mini Squat with Counter Support - 1 x daily - 7 x weekly - 3 sets - 10 reps   ASSESSMENT:  CLINICAL IMPRESSION: Patient is a 63 yr old M who was seen for PT eval and treat for chronic bilateral  knee pain L>R. Eval findings indicate no ROM restrictions, hip/core weakness and functional limitations due to pain. Patient reported outcomes indicate large limitations in pts subjective current level of function. STS score indicates functional deficits compared to age related norms. score indicates some impairment in functional abilities, pt states he could have gone faster and pushed through the pain. Overall pt tolerated exercises well, he has good baseline knee strength but we will work on increasing load during therapy sessions and he can continue to progress strength at home, as he has access to his brothers gym. Pt education was provided regarding the importance of staying active and consistent with  LE exercises to try and reduce pain. Skilled PT is required to address the pts deficits in hip and core strength and LE functionally which impact their ability to safely and independently perform activities at the pts PLOF.  OBJECTIVE IMPAIRMENTS: decreased strength and pain.   ACTIVITY LIMITATIONS: lifting, bending, standing, squatting, and stairs  PARTICIPATION LIMITATIONS: laundry, community activity, occupation, and yard work  PERSONAL FACTORS: Fitness, Past/current experiences, Time since onset of injury/illness/exacerbation, and 1 comorbidity: HTN are also affecting patient's functional outcome.   REHAB POTENTIAL: Good  CLINICAL DECISION MAKING: Stable/uncomplicated  EVALUATION COMPLEXITY: Low   GOALS: Goals reviewed with patient? No  SHORT TERM GOALS: Target date: 02/17/2024 Pt will be independent with their HEP to promote self management of their condition and support progression towards functional goals Baseline: initial HEP given at eval  Goal status: INITIAL  2.  Pt will report a decrease in baseline L knee pain to 5/10 on the 0-10 scale to allow for improved participation in functional activities Baseline: 7/10 Goal status: INITIAL  3.  Pt will improve performance on 30sec STS from baseline to 10 to reflect improved functional mobility and safety Baseline: 6 Goal status: INITIAL   LONG TERM GOALS: Target date: 03/23/2024  Pt will report a decrease in baseline L knee pain to 2/10 on the 0-10 scale to allow for improved participation in functional activities Baseline: 7/10 Goal status: INITIAL  2.  Pt will improve performance on 30 sec STS from baseline to 15 to reflect improved functional mobility and safety to that of age related norms Baseline: 6 Goal status: INITIAL  3.  Pt will improve bilateral hip MMT to 5/5 for improved comfort and functional strength Baseline: 4/5 Goal status: INITIAL  4.    Pt will improve performance on from baseline to 580 ft  to reflect improved functional mobility and safety to that of age related norms Baseline: 410 ft Goal status: INITIAL  5.  Pt will demonstrate adequate knee control with eccentric heel tap to demonstrate strength gains and improved functional activity tolerance Baseline: unable to perform  Goal status: INITIAL    PLAN:  PT FREQUENCY: 1x/week  PT DURATION: 8 weeks  PLANNED INTERVENTIONS: 97110-Therapeutic exercises, 97530- Therapeutic activity, [id number]- Neuromuscular re-education, 97535- Self Care, and [id number]- Manual therapy  PLAN FOR NEXT SESSION: Plan to assess response to HEP, assess load capacity for LE exercise to tailor POC better   Holly Schneider, SPT 01/27/24 11:46 AM

## 2024-02-11 ENCOUNTER — Ambulatory Visit: Admitting: Physical Therapy

## 2024-02-14 ENCOUNTER — Ambulatory Visit

## 2024-02-14 ENCOUNTER — Encounter

## 2024-02-14 NOTE — Therapy (Incomplete)
 OUTPATIENT PHYSICAL THERAPY TREATMENT   Patient Name: Todd Ryan MRN: [id number] DOB:May 28, 1961, 63 y.o., male Today's Date: 02/14/2024  END OF SESSION:    Past Medical History:  Diagnosis Date   Allergic rhinitis    Arthritis    shoulder, neck, back   Asthma    Chronic back pain    GAD (generalized anxiety disorder)    GERD (gastroesophageal reflux disease)    History of asthma    child   Hyperlipidemia    Hypertension    MDD (major depressive disorder)    OSA on CPAP    07-15-2020  per pt uses approx. 3 times weekly   Peripheral neuropathy    Pre-diabetes    Rotator cuff tear, right    Wears glasses    Past Surgical History:  Procedure Laterality Date   COLONOSCOPY WITH PROPOFOL   05/31/2020   LUMBAR SPINE SURGERY  11/2014   SHOULDER ARTHROSCOPY WITH ROTATOR CUFF REPAIR AND SUBACROMIAL DECOMPRESSION Right 07/16/2020   Procedure: SHOULDER ARTHROSCOPY WITH ROTATOR CUFF REPAIR AND SUBACROMIAL DECOMPRESSION;  Surgeon: Beverley Evalene BIRCH, MD;  Location: [facility];  Service: Orthopedics;  Laterality: Right;   SHOULDER ARTHROSCOPY WITH ROTATOR CUFF REPAIR AND SUBACROMIAL DECOMPRESSION Right 05/05/2022   Procedure: SHOULDER ARTHROSCOPY WITH ROTATOR CUFF REPAIR AND SUBACROMIAL DECOMPRESSION. LOOSE BODYEXCISION;  Surgeon: Beverley Evalene BIRCH, MD;  Location: [facility];  Service: Orthopedics;  Laterality: Right;   Patient Active Problem List   Diagnosis Date Noted   Body mass index (BMI) 32.0-32.9, adult 02/19/2021   Right shoulder pain 02/19/2021   Neck pain 11/21/2020   DDD (degenerative disc disease), lumbosacral 10/20/2019   TIA (transient ischemic attack) 06/02/2018   Hypertension 06/02/2018   Complication of surgical procedure 06/03/2015   Displacement of lumbar intervertebral disc without myelopathy 08/27/2014   Lower back injury 12/07/2013    PCP: Shelda Atlas, MD   REFERRING PROVIDER: Burnetta Brunet, DO   REFERRING DIAG:  M25.561,M25.562,G89.29 (ICD-10-CM) - Chronic pain of both knees  M17.12 (ICD-10-CM) - Unilateral primary osteoarthritis, left knee  M25.562,G89.29 (ICD-10-CM) - Chronic patellofemoral pain of left knee   THERAPY DIAG:  No diagnosis found.  Rationale for Evaluation and Treatment: Rehabilitation  ONSET DATE: Chronic  SUBJECTIVE:   SUBJECTIVE STATEMENT: ***  EVAL: Pt reports to physical therapy for evaluation and treatment of Bilateral knee pain L > R. Imaging has revealed mild joint space narrowing of medial tibiofemoral joint bilaterally.   Pt states coming in L knee 7/10 R 2/10. R knee pain started 3-4 months ago L started 2-3 months ago. Pt reports that walking long distances and bending down and picking stuff up are his biggest deficits currently. Pt received injection about 2 weeks ago in L knee and it has helped him with his pain. Pt has responded well to PT in the past.  PERTINENT HISTORY: HTN Arthritis  PAIN:  Are you having pain? Yes: NPRS scale: L 7/10, R 2/10 Pain location: L and R all across the knee  Pain description: sharp and shooting Aggravating factors: standing walking, sitting,  Relieving factors: rest, ice  PRECAUTIONS: None  RED FLAGS: None   WEIGHT BEARING RESTRICTIONS: No  FALLS:  Has patient fallen in last 6 months? No  LIVING ENVIRONMENT: Lives with: lives with their spouse 1 kid at home Lives in: House/apartment Stairs:  Has following equipment at home: None  OCCUPATION: part time exterminator   PLOF: Independent currently limited in some activities   PATIENT GOALS: get rid of  pain  NEXT MD VISIT: 03/01/24  OBJECTIVE:  Note: Objective measures were completed at Evaluation unless otherwise noted.  DIAGNOSTIC FINDINGS: mild medial tibiofemoral joint space narrowing bilaterally   PATIENT SURVEYS:  LEFS: 21/80  COGNITION: Overall cognitive status: Within functional limits for tasks assessed     SENSATION: Not tested  EDEMA L knee  swelling noted greater than right  MUSCLE LENGTH: Hamstrings: Right 40 deg ; Left 30 deg  POSTURE: No Significant postural limitations  PALPATION: TTP: R infra patella          L medial joint line, L infrapatella  LOWER EXTREMITY ROM:  Active ROM Right eval Left eval  Hip flexion    Hip extension    Hip abduction    Hip adduction    Hip internal rotation    Hip external rotation    Knee flexion 120 120  Knee extension 0 -2  Ankle dorsiflexion    Ankle plantarflexion    Ankle inversion    Ankle eversion     (Blank rows = not tested)  LOWER EXTREMITY MMT:  MMT Right eval Left eval  Hip flexion 4 4  Hip extension    Hip abduction 4 4  Hip adduction    Hip internal rotation    Hip external rotation    Knee flexion 5 5  Knee extension 5 5  Ankle dorsiflexion    Ankle plantarflexion    Ankle inversion    Ankle eversion     (Blank rows = not tested)  LOWER EXTREMITY SPECIAL TESTS:    FUNCTIONAL TESTS:  30 sec STS: 6 , increased back pain : 409 ft  GAIT: Distance walked: 468ft Assistive device utilized: None Level of assistance: Complete Independence Comments: none                                                                                                                                 TREATMENT DATE:  [facility] Adult PT Treatment:                                                DATE:02/14/2024 Therapeutic Exercise: SLR x 10 ea SL Hip abduction x 10  Knee extension machine 20 lbs x 10 SL knee extension 10 lb x10 ea Leg press 60# x15 Lateral walking RTB x 10 ft down and back 5x BW squats at counter with cues to keep heels down x 8 Self Care: Educated on performance of HEP, importance of exercise and staying active to minimize pain  [facility] Adult PT Treatment:  DATE: 01/27/2024 Therapeutic Exercise: SLR x 10 ea SL Hip abduction x 10  Knee extension machine 20 lbs x 10 SL knee extension 10 lb x10  ea Leg press 60# x15 Lateral walking RTB x 10 ft down and back 5x BW squats at counter with cues to keep heels down x 8 Self Care: Educated on performance of HEP, importance of exercise and staying active to minimize pain     PATIENT EDUCATION:  Education details: Educated on eval findings, POC, HEP performance,  [place] educated: Patient Education method: Programmer, multimedia, Facilities manager, Verbal cues, and Handouts Education comprehension: verbalized understanding and returned demonstration  HOME EXERCISE PROGRAM: Access Code: [email or web address] URL: [email or web address] Date: 01/27/2024 Prepared by: Alm Kingdom  Exercises - Active Straight Leg Raise with Quad Set - 1 x daily - 7 x weekly - 3 sets - 10 reps - Sidelying Hip Abduction - 1 x daily - 7 x weekly - 3 sets - 10 reps - Knee Extension with Weight Machine - 1 x daily - 7 x weekly - 3 sets - 10 reps - 20lb hold - Full Leg Press - 1 x daily - 7 x weekly - 3 sets - 10 reps - 80lb hold - Side Stepping with Resistance at Ankles - 1 x daily - 7 x weekly - 5 sets - 12ft hold - Mini Squat with Counter Support - 1 x daily - 7 x weekly - 3 sets - 10 reps   ASSESSMENT:  CLINICAL IMPRESSION: ***  EVAL: Patient is a 63 yr old M who was seen for PT eval and treat for chronic bilateral  knee pain L>R. Eval findings indicate no ROM restrictions, hip/core weakness and functional limitations due to pain. Patient reported outcomes indicate large limitations in pts subjective current level of function. STS score indicates functional deficits compared to age related norms. score indicates some impairment in functional abilities, pt states he could have gone faster and pushed through the pain. Overall pt tolerated exercises well, he has good baseline knee strength but we will work on increasing load during therapy sessions and he can continue to progress strength at home, as he has access to his brothers gym. Pt education was provided  regarding the importance of staying active and consistent with LE exercises to try and reduce pain. Skilled PT is required to address the pts deficits in hip and core strength and LE functionally which impact their ability to safely and independently perform activities at the pts PLOF.  OBJECTIVE IMPAIRMENTS: decreased strength and pain.   ACTIVITY LIMITATIONS: lifting, bending, standing, squatting, and stairs  PARTICIPATION LIMITATIONS: laundry, community activity, occupation, and yard work  PERSONAL FACTORS: Fitness, Past/current experiences, Time since onset of injury/illness/exacerbation, and 1 comorbidity: HTN are also affecting patient's functional outcome.   REHAB POTENTIAL: Good  CLINICAL DECISION MAKING: Stable/uncomplicated  EVALUATION COMPLEXITY: Low   GOALS: Goals reviewed with patient? No  SHORT TERM GOALS: Target date: 02/17/2024 Pt will be independent with their HEP to promote self management of their condition and support progression towards functional goals Baseline: initial HEP given at eval  Goal status: INITIAL  2.  Pt will report a decrease in baseline L knee pain to 5/10 on the 0-10 scale to allow for improved participation in functional activities Baseline: 7/10 Goal status: INITIAL  3.  Pt will improve performance on 30sec STS from baseline to 10 to reflect improved functional mobility and safety Baseline: 6 Goal status: INITIAL   LONG TERM GOALS: Target date: 03/23/2024  Pt will report a decrease in baseline L knee pain to 2/10 on the 0-10 scale to allow for improved participation in functional activities Baseline: 7/10 Goal status: INITIAL  2.  Pt will improve performance on 30 sec STS from baseline to 15 to reflect improved functional mobility and safety to that of age related norms Baseline: 6 Goal status: INITIAL  3.  Pt will improve bilateral hip MMT to 5/5 for improved comfort and functional strength Baseline: 4/5 Goal status: INITIAL  4.     Pt will improve performance on from baseline to 580 ft to reflect improved functional mobility and safety to that of age related norms Baseline: 410 ft Goal status: INITIAL  5.  Pt will demonstrate adequate knee control with eccentric heel tap to demonstrate strength gains and improved functional activity tolerance Baseline: unable to perform  Goal status: INITIAL    PLAN:  PT FREQUENCY: 1x/week  PT DURATION: 8 weeks  PLANNED INTERVENTIONS: 97110-Therapeutic exercises, 97530- Therapeutic activity, [id number]- Neuromuscular re-education, 97535- Self Care, and 02859- Manual therapy  PLAN FOR NEXT SESSION: Plan to assess response to HEP, assess load capacity for LE exercise to tailor POC better   Alm JAYSON Kingdom PT  02/14/24 7:59 AM

## 2024-02-16 ENCOUNTER — Other Ambulatory Visit: Payer: Self-pay | Admitting: Urology

## 2024-02-17 NOTE — Progress Notes (Signed)
 Sent message, via epic in basket, requesting orders in epic from Careers adviser.

## 2024-02-22 NOTE — Patient Instructions (Signed)
 SURGICAL WAITING ROOM VISITATION  Patients having surgery or a procedure may have no more than 2 support people in the waiting area - these visitors may rotate.    Children under the age of 25 must have an adult with them who is not the patient.  Visitors with respiratory illnesses are discouraged from visiting and should remain at home.  If the patient needs to stay at the hospital during part of their recovery, the visitor guidelines for inpatient rooms apply. Pre-op nurse will coordinate an appropriate time for 1 support Todd Ryan to accompany patient in pre-op.  This support Todd Ryan may not rotate.    Please refer to the [facility] website for the visitor guidelines for Inpatients (after your surgery is over and you are in a regular room).       Your procedure is scheduled on: 03/13/24   Report to [facility] Main Entrance    Report to admitting at 7:45 AM   Call this number if you have problems the morning of surgery [phone]   Do not eat food or drink liquids:After Midnight.but may have sips of water with meds.    DENTURES WILL BE REMOVED PRIOR TO SURGERY PLEASE DO NOT APPLY Poly grip OR ADHESIVES!!!   Do NOT smoke after Midnight   Stop all vitamins and herbal supplements 7 days before surgery.   Take these medicines the morning of surgery with A SIP OF WATER: Amlodipine , Atorvastatin , Buspar , Cetirizine , Esomeprazole , oxycodone , Prednisone , Tamsulosin, nasal spray , tylenol   Bring CPAP mask and tubing day of surgery.                              You may not have any metal on your body including hair pins, jewelry, and body piercing             Do not wear make-up, lotions, powders, perfumes/cologne, or deodorant              Men may shave face and neck.   Do not bring valuables to the hospital. [facility] IS NOT             RESPONSIBLE   FOR VALUABLES.   Contacts, glasses, dentures or bridgework may not be worn into surgery.  DO NOT BRING YOUR HOME  MEDICATIONS TO THE HOSPITAL. PHARMACY WILL DISPENSE MEDICATIONS LISTED ON YOUR MEDICATION LIST TO YOU DURING YOUR ADMISSION IN THE HOSPITAL!    Patients discharged on the day of surgery will not be allowed to drive home.  Someone NEEDS to stay with you for the first 24 hours after anesthesia.   Special Instructions: Bring a copy of your healthcare power of attorney and living will documents the day of surgery if you haven't scanned them before.              Please read over the following fact sheets you were given: IF YOU HAVE QUESTIONS ABOUT YOUR PRE-OP INSTRUCTIONS PLEASE CALL [phone] Todd Ryan   If you received a COVID test during your pre-op visit  it is requested that you wear a mask when out in public, stay away from anyone that may not be feeling well and notify your surgeon if you develop symptoms. If you test positive for Covid or have been in contact with anyone that has tested positive in the last 10 days please notify you surgeon.    [facility] - Preparing for Surgery Before surgery, you can play an  important role.  Because skin is not sterile, your skin needs to be as free of germs as possible.  You can reduce the number of germs on your skin by washing with CHG (chlorahexidine gluconate) soap before surgery.  CHG is an antiseptic cleaner which kills germs and bonds with the skin to continue killing germs even after washing. Please DO NOT use if you have an allergy to CHG or antibacterial soaps.  If your skin becomes reddened/irritated stop using the CHG and inform your nurse when you arrive at Short Stay. Do not shave (including legs and underarms) for at least 48 hours prior to the first CHG shower.  You may shave your face/neck.  Please follow these instructions carefully:  1.  Shower with CHG Soap the night before surgery and the  morning of surgery.  2.  If you choose to wash your hair, wash your hair first as usual with your normal  shampoo.  3.  After you shampoo, rinse your  hair and body thoroughly to remove the shampoo.                             4.  Use CHG as you would any other liquid soap.  You can apply chg directly to the skin and wash.  Gently with a scrungie or clean washcloth.  5.  Apply the CHG Soap to your body ONLY FROM THE NECK DOWN.   Do   not use on face/ open                           Wound or open sores. Avoid contact with eyes, ears mouth and   genitals (private parts).                       Wash face,  Genitals (private parts) with your normal soap.             6.  Wash thoroughly, paying special attention to the area where your    surgery  will be performed.  7.  Thoroughly rinse your body with warm water from the neck down.  8.  DO NOT shower/wash with your normal soap after using and rinsing off the CHG Soap.                9.  Pat yourself dry with a clean towel.            10.  Wear clean pajamas.            11.  Place clean sheets on your bed the night of your first shower and do not  sleep with pets. Day of Surgery : Do not apply any lotions/deodorants the morning of surgery.  Please wear clean clothes to the hospital/surgery center.  FAILURE TO FOLLOW THESE INSTRUCTIONS MAY RESULT IN THE CANCELLATION OF YOUR SURGERY  PATIENT SIGNATURE_________________________________  NURSE SIGNATURE__________________________________  ________________________________________________________________________

## 2024-02-22 NOTE — Progress Notes (Signed)
 COVID Vaccine received:  []  No [x]  Yes Date of any COVID positive Test in last 90 days: no PCP - Dr. Aliene Colon Cardiologist - n/a  Chest x-ray -  EKG -  02/21/24 CEW Stress Test -  ECHO -  Cardiac Cath -   Bowel Prep - [x]  No  []   Yes ______  Pacemaker / ICD device [x]  No []  Yes   Spinal Cord Stimulator:[x]  No []  Yes       History of Sleep Apnea? []  No [x]  Yes   CPAP used?- []  No [x]  Yes    Does the patient monitor blood sugar?          [x]  No []  Yes  []  N/A  Patient has: []  NO Hx DM   [x]  Pre-DM                 []  DM1  []   DM2 Does patient have a [employer] or Dexacom? []  No []  Yes   Fasting Blood Sugar Ranges-  Checks Blood Sugar _____ times a day  GLP1 agonist / usual dose - no GLP1 instructions:  SGLT-2 inhibitors / usual dose - no SGLT-2 instructions:   Blood Thinner / Instructions:no Aspirin  Instructions:no  Comments:   Activity level: Patient is able to climb a flight of stairs without difficulty; [x]  No CP  [x]  No SOB,    Patient can perform ADLs without assistance.   Anesthesia review:   Patient denies shortness of breath, fever, cough and chest pain at PAT appointment.  Patient verbalized understanding and agreement to the Pre-Surgical Instructions that were given to them at this PAT appointment. Patient was also educated of the need to review these PAT instructions again prior to his/her surgery.I reviewed the appropriate phone numbers to call if they have any and questions or concerns.

## 2024-02-24 ENCOUNTER — Encounter (HOSPITAL_COMMUNITY)
Admission: RE | Admit: 2024-02-24 | Discharge: 2024-02-24 | Disposition: A | Discharge status: Home / Self Care | Source: Ambulatory Visit | Attending: Urology | Admitting: Urology

## 2024-02-24 ENCOUNTER — Other Ambulatory Visit: Payer: Self-pay

## 2024-02-24 ENCOUNTER — Encounter (HOSPITAL_COMMUNITY): Payer: Self-pay

## 2024-02-24 VITALS — BP 139/98 | HR 58 | Temp 97.9°F | Resp 16 | Ht 74.0 in | Wt 240.0 lb

## 2024-02-24 DIAGNOSIS — Z01812 Encounter for preprocedural laboratory examination: Secondary | ICD-10-CM | POA: Insufficient documentation

## 2024-02-24 DIAGNOSIS — Z01818 Encounter for other preprocedural examination: Secondary | ICD-10-CM

## 2024-02-24 DIAGNOSIS — I10 Essential (primary) hypertension: Secondary | ICD-10-CM | POA: Diagnosis not present

## 2024-03-01 ENCOUNTER — Encounter: Payer: Self-pay | Admitting: Sports Medicine

## 2024-03-01 ENCOUNTER — Ambulatory Visit: Admitting: Sports Medicine

## 2024-03-01 DIAGNOSIS — G8929 Other chronic pain: Secondary | ICD-10-CM

## 2024-03-01 DIAGNOSIS — M25562 Pain in left knee: Secondary | ICD-10-CM

## 2024-03-01 DIAGNOSIS — M222X2 Patellofemoral disorders, left knee: Secondary | ICD-10-CM | POA: Diagnosis not present

## 2024-03-01 DIAGNOSIS — M25462 Effusion, left knee: Secondary | ICD-10-CM

## 2024-03-01 DIAGNOSIS — M25561 Pain in right knee: Secondary | ICD-10-CM | POA: Diagnosis not present

## 2024-03-01 MED ORDER — DICLOFENAC SODIUM 75 MG PO TBEC: 75.0000 mg | DELAYED_RELEASE_TABLET | Freq: Two times a day (BID) | ORAL | 1 refills | Status: DC

## 2024-03-01 NOTE — Addendum Note (Signed)
 Addended by: WELLS, LYDIA E on: 03/01/2024 11:10 AM   Modules accepted: Orders

## 2024-03-01 NOTE — Progress Notes (Signed)
 Patient says that he got about 2 weeks of complete relief from the injection. Since then, his pain has returned gradually. He says that at his last visit, his pain was a 10/10, and today it is a 5/10. He went to one physical therapy appointment where they gave him home exercises that he does daily. He does believe these are helping. He is taking Meloxicam  daily, which does not seem to give him any relief.

## 2024-03-01 NOTE — Progress Notes (Addendum)
 Todd Ryan - 63 y.o. male MRN [id number]  Date of birth: 12-01-1960  Office Visit Note: Visit Date: 03/01/2024 PCP: Shelda Atlas, MD Referred by: Shelda Atlas, MD  Subjective: Chief Complaint  Patient presents with   Left Knee - Follow-up   HPI: Todd Ryan is a pleasant 63 y.o. male who presents today for f/u of acute on chronic left knee pain.   Desmin states the right knee is doing very well, he really is not having any issues with this.  The left knee is doing better than previous.  He received at least 2 weeks of complete relief of his pain after corticosteroid injection, but after this 2 weeks his pain slowly started to return.  His pain went from a 10/10 down to a 5/10 today.  He did go to formalized physical therapy and then has been transition to home exercise regimen which he is doing every day for the knee for the last 6 weeks.  He is taking meloxicam  15 mg once daily but does not feel it is having a major effect.  Pertinent ROS were reviewed with the patient and found to be negative unless otherwise specified above in HPI.   Assessment & Plan: Visit Diagnoses:  1. Chronic pain of both knees   2. Patellofemoral disorder, left   3. Effusion of left knee    Plan: Impression is improved but symptomatic left knee with resolution of right knee pain which does have evidence of patellofemoral arthralgia of both knees.  The left knee is most symptomatic and he did receive 100% pain relief for 2 weeks status postinjection which does point to this being an intra-articular pathology.  He still has a small effusion on the left knee and is reporting clicking/popping which is difficult to ascertain whether this is from patellofemoral instability versus underlying meniscal tearing.  Given the extent of his pain, the fact he has progressed through 6 weeks of PT/HEP and has not had much improvement with NSAID therapy, we will move forward with an MRI of the knee to evaluate for any  meniscal or intra-articular pathology.  Transition to an off of meloxicam  into diclofenac  75 mg twice daily to be taken with food.  He will continue with PT/HEP in the interim.  We will see him back 1 week following MRI to review and discuss next steps.  Follow-up: Return for Follow-up 1 week after MRI.   Meds & Orders:  Meds ordered this encounter  Medications   diclofenac  (VOLTAREN ) 75 MG EC tablet    Sig: Take 1 tablet (75 mg total) by mouth 2 (two) times daily.    Dispense:  40 tablet    Refill:  1    Orders Placed This Encounter  Procedures   MR Knee Left w/o contrast     Procedures: No procedures performed      Clinical History: No specialty comments available.  He reports that he has been smoking cigarettes. He has never used smokeless tobacco. No results for input(s): HGBA1C, LABURIC in the last 8760 hours.  Objective:    Physical Exam  Gen: Well-appearing, in no acute distress; non-toxic CV: Well-perfused. Warm.  Resp: Breathing unlabored on room air; no wheezing. Psych: Fluid speech in conversation; appropriate affect; normal thought process  Ortho Exam - Bilateral knees: Right knee without redness swelling or effusion.  Preserved range of motion.  The left knee has a small effusion present.  There is pain with endrange flexion with range of motion  from 0-120 degrees compared to 0-130 degrees of the contralateral knee.  There is associated pain over the lateral aspect of the knee joint with McMurray's testing without significant click.  Positive patellar grind test, equivocal Thessaly's testing.  Imaging: No results found.  Past Medical/Family/Surgical/Social History: Medications & Allergies reviewed per EMR, new medications updated. Patient Active Problem List   Diagnosis Date Noted   Body mass index (BMI) 32.0-32.9, adult 02/19/2021   Right shoulder pain 02/19/2021   Neck pain 11/21/2020   DDD (degenerative disc disease), lumbosacral 10/20/2019   TIA  (transient ischemic attack) 06/02/2018   Hypertension 06/02/2018   Complication of surgical procedure 06/03/2015   Displacement of lumbar intervertebral disc without myelopathy 08/27/2014   Lower back injury 12/07/2013   Past Medical History:  Diagnosis Date   Allergic rhinitis    Arthritis    shoulder, neck, back   Asthma    Chronic back pain    GAD (generalized anxiety disorder)    GERD (gastroesophageal reflux disease)    History of asthma    child   Hyperlipidemia    Hypertension    MDD (major depressive disorder)    OSA on CPAP    07-15-2020  per pt uses approx. 3 times weekly   Peripheral neuropathy    Pre-diabetes    Rotator cuff tear, right    Wears glasses    Family History  Problem Relation Age of Onset   Hypertension Mother    CAD Father    Colon cancer Neg Hx    Esophageal cancer Neg Hx    Rectal cancer Neg Hx    Stomach cancer Neg Hx    Past Surgical History:  Procedure Laterality Date   COLONOSCOPY WITH PROPOFOL   05/31/2020   LUMBAR SPINE SURGERY  11/2014   SHOULDER ARTHROSCOPY WITH ROTATOR CUFF REPAIR AND SUBACROMIAL DECOMPRESSION Right 07/16/2020   Procedure: SHOULDER ARTHROSCOPY WITH ROTATOR CUFF REPAIR AND SUBACROMIAL DECOMPRESSION;  Surgeon: Beverley Evalene BIRCH, MD;  Location: [facility];  Service: Orthopedics;  Laterality: Right;   SHOULDER ARTHROSCOPY WITH ROTATOR CUFF REPAIR AND SUBACROMIAL DECOMPRESSION Right 05/05/2022   Procedure: SHOULDER ARTHROSCOPY WITH ROTATOR CUFF REPAIR AND SUBACROMIAL DECOMPRESSION. LOOSE BODYEXCISION;  Surgeon: Beverley Evalene BIRCH, MD;  Location: [facility];  Service: Orthopedics;  Laterality: Right;   Social History   Occupational History   Not on file  Tobacco Use   Smoking status: Some Days    Types: Cigarettes   Smokeless tobacco: Never   Tobacco comments:    07-15-2020 per pt 1 ppmonth  Vaping Use   Vaping status: Never Used  Substance and Sexual Activity   Alcohol use: Yes     Comment: occas   Drug use: Never   Sexual activity: Yes

## 2024-03-10 NOTE — Anesthesia Preprocedure Evaluation (Addendum)
 Anesthesia Evaluation  Patient identified by MRN, date of birth, ID band Patient awake    Reviewed: Allergy & Precautions, NPO status , Patient's Chart, lab work & pertinent test results  Airway Mallampati: III  TM Distance: >3 FB Neck ROM: Full    Dental no notable dental hx. (+) Missing, Dental Advisory Given,    Pulmonary asthma , sleep apnea and Continuous Positive Airway Pressure Ventilation , Current Smoker and Patient abstained from smoking. Only occasionally uses cpap 7-8cigg/d Albuterol last use a few weeks ago    Pulmonary exam normal breath sounds clear to auscultation       Cardiovascular hypertension (132/85 preop), Pt. on medications Normal cardiovascular exam Rhythm:Regular Rate:Normal     Neuro/Psych  PSYCHIATRIC DISORDERS Anxiety Depression    TIA   GI/Hepatic Neg liver ROS,GERD  Controlled,,  Endo/Other  negative endocrine ROS    Renal/GU negative Renal ROS  negative genitourinary   Musculoskeletal  (+) Arthritis , Osteoarthritis,    Abdominal   Peds  Hematology negative hematology ROS (+)   Anesthesia Other Findings   Reproductive/Obstetrics negative OB ROS                              Anesthesia Physical Anesthesia Plan  ASA: 2  Anesthesia Plan: General   Post-op Pain Management: Tylenol  PO (pre-op)*   Induction: Intravenous  PONV Risk Score and Plan: 1 and Ondansetron , Dexamethasone , Midazolam  and Treatment may vary due to age or medical condition  Airway Management Planned: LMA  Additional Equipment: None  Intra-op Plan:   Post-operative Plan: Extubation in OR  Informed Consent: I have reviewed the patients History and Physical, chart, labs and discussed the procedure including the risks, benefits and alternatives for the proposed anesthesia with the patient or authorized representative who has indicated his/her understanding and acceptance.      Dental advisory given  Plan Discussed with: CRNA  Anesthesia Plan Comments:          Anesthesia Quick Evaluation

## 2024-03-12 ENCOUNTER — Ambulatory Visit
Admission: RE | Admit: 2024-03-12 | Discharge: 2024-03-12 | Disposition: A | Discharge status: Home / Self Care | Source: Ambulatory Visit | Attending: Sports Medicine | Admitting: Sports Medicine

## 2024-03-12 DIAGNOSIS — G8929 Other chronic pain: Secondary | ICD-10-CM

## 2024-03-12 DIAGNOSIS — M222X2 Patellofemoral disorders, left knee: Secondary | ICD-10-CM

## 2024-03-13 ENCOUNTER — Encounter (HOSPITAL_COMMUNITY)
Admission: RE | Disposition: A | Discharge status: Home / Self Care | Payer: Self-pay | Source: Home / Self Care | Attending: Urology

## 2024-03-13 ENCOUNTER — Ambulatory Visit (HOSPITAL_COMMUNITY)
Admission: RE | Admit: 2024-03-13 | Discharge: 2024-03-13 | Disposition: A | Discharge status: Home / Self Care | Attending: Urology | Admitting: Urology

## 2024-03-13 ENCOUNTER — Ambulatory Visit (HOSPITAL_COMMUNITY): Payer: Self-pay | Admitting: Anesthesiology

## 2024-03-13 ENCOUNTER — Encounter (HOSPITAL_COMMUNITY): Payer: Self-pay | Admitting: Urology

## 2024-03-13 ENCOUNTER — Ambulatory Visit (HOSPITAL_COMMUNITY)

## 2024-03-13 ENCOUNTER — Ambulatory Visit (HOSPITAL_BASED_OUTPATIENT_CLINIC_OR_DEPARTMENT_OTHER): Payer: Self-pay | Admitting: Anesthesiology

## 2024-03-13 DIAGNOSIS — N35912 Unspecified bulbous urethral stricture, male: Secondary | ICD-10-CM | POA: Diagnosis not present

## 2024-03-13 DIAGNOSIS — N35919 Unspecified urethral stricture, male, unspecified site: Secondary | ICD-10-CM | POA: Diagnosis not present

## 2024-03-13 DIAGNOSIS — N35112 Postinfective bulbous urethral stricture, not elsewhere classified: Secondary | ICD-10-CM | POA: Diagnosis present

## 2024-03-13 HISTORY — PX: CYSTOURETHROSCOPY, W/ URETHRAL STRICTURE DILATION USING DRUG-COATED BALLOON: SHX7696

## 2024-03-13 HISTORY — PX: CYSTOSCOPY WITH DIRECT VISION INTERNAL URETHROTOMY: SHX6637

## 2024-03-13 SURGERY — CYSTOSCOPY, WITH DIRECT VISION INTERNAL URETHROTOMY
Anesthesia: General | Site: Urethra

## 2024-03-13 MED ORDER — CHLORHEXIDINE GLUCONATE 0.12 % MT SOLN
15.0000 mL | Freq: Once | OROMUCOSAL | Status: AC
  Administered 2024-03-13: 15 mL via OROMUCOSAL

## 2024-03-13 MED ORDER — OXYCODONE HCL 5 MG/5ML PO SOLN: 5.0000 mg | Freq: Once | ORAL | Status: AC | PRN

## 2024-03-13 MED ORDER — ONDANSETRON HCL 4 MG/2ML IJ SOLN: 4.0000 mg | Freq: Once | INTRAMUSCULAR | Status: DC | PRN

## 2024-03-13 MED ORDER — LIDOCAINE 2% (20 MG/ML) 5 ML SYRINGE
INTRAMUSCULAR | Status: DC | PRN
  Administered 2024-03-13: 60 mg via INTRAVENOUS

## 2024-03-13 MED ORDER — FENTANYL CITRATE (PF) 100 MCG/2ML IJ SOLN
INTRAMUSCULAR | Status: AC
  Filled 2024-03-13: qty 2

## 2024-03-13 MED ORDER — HYDROMORPHONE HCL 1 MG/ML IJ SOLN
0.2500 mg | INTRAMUSCULAR | Status: DC | PRN
  Administered 2024-03-13: 0.5 mg via INTRAVENOUS

## 2024-03-13 MED ORDER — ACETAMINOPHEN 500 MG PO TABS
1000.0000 mg | ORAL_TABLET | Freq: Once | ORAL | Status: AC
Start: 2024-03-13 — End: 2024-03-13
  Administered 2024-03-13: 1000 mg via ORAL
  Filled 2024-03-13: qty 2

## 2024-03-13 MED ORDER — LIDOCAINE HCL (PF) 2 % IJ SOLN
INTRAMUSCULAR | Status: AC
  Filled 2024-03-13: qty 5

## 2024-03-13 MED ORDER — FENTANYL CITRATE (PF) 250 MCG/5ML IJ SOLN
INTRAMUSCULAR | Status: DC | PRN
  Administered 2024-03-13: 100 ug via INTRAVENOUS

## 2024-03-13 MED ORDER — PROPOFOL 10 MG/ML IV BOLUS
INTRAVENOUS | Status: AC
  Filled 2024-03-13: qty 20

## 2024-03-13 MED ORDER — IOHEXOL 300 MG/ML  SOLN
INTRAMUSCULAR | Status: DC | PRN
  Administered 2024-03-13: 35 mL

## 2024-03-13 MED ORDER — PROPOFOL 10 MG/ML IV BOLUS
INTRAVENOUS | Status: DC | PRN
  Administered 2024-03-13: 200 mg via INTRAVENOUS

## 2024-03-13 MED ORDER — DEXAMETHASONE SODIUM PHOSPHATE 10 MG/ML IJ SOLN
INTRAMUSCULAR | Status: AC
  Filled 2024-03-13: qty 1

## 2024-03-13 MED ORDER — LACTATED RINGERS IV SOLN
INTRAVENOUS | Status: DC
Start: 2024-03-13 — End: 2024-03-13

## 2024-03-13 MED ORDER — AMISULPRIDE (ANTIEMETIC) 5 MG/2ML IV SOLN: 10.0000 mg | Freq: Once | INTRAVENOUS | Status: DC | PRN

## 2024-03-13 MED ORDER — ROCURONIUM BROMIDE 10 MG/ML (PF) SYRINGE
PREFILLED_SYRINGE | INTRAVENOUS | Status: AC
  Filled 2024-03-13: qty 10

## 2024-03-13 MED ORDER — EPHEDRINE 5 MG/ML INJ
INTRAVENOUS | Status: AC
  Filled 2024-03-13: qty 5

## 2024-03-13 MED ORDER — ONDANSETRON HCL 4 MG/2ML IJ SOLN
INTRAMUSCULAR | Status: DC | PRN
  Administered 2024-03-13: 4 mg via INTRAVENOUS

## 2024-03-13 MED ORDER — MIDAZOLAM HCL 2 MG/2ML IJ SOLN
INTRAMUSCULAR | Status: DC | PRN
  Administered 2024-03-13: 2 mg via INTRAVENOUS

## 2024-03-13 MED ORDER — HYDROMORPHONE HCL 1 MG/ML IJ SOLN
INTRAMUSCULAR | Status: AC
  Filled 2024-03-13: qty 1

## 2024-03-13 MED ORDER — MIDAZOLAM HCL 2 MG/2ML IJ SOLN
INTRAMUSCULAR | Status: AC
  Filled 2024-03-13: qty 2

## 2024-03-13 MED ORDER — ORAL CARE MOUTH RINSE: 15.0000 mL | Freq: Once | OROMUCOSAL | Status: AC

## 2024-03-13 MED ORDER — DEXAMETHASONE SODIUM PHOSPHATE 10 MG/ML IJ SOLN
INTRAMUSCULAR | Status: DC | PRN
  Administered 2024-03-13: 10 mg via INTRAVENOUS

## 2024-03-13 MED ORDER — ONDANSETRON HCL 4 MG/2ML IJ SOLN
INTRAMUSCULAR | Status: AC
  Filled 2024-03-13: qty 2

## 2024-03-13 MED ORDER — CEFAZOLIN SODIUM-DEXTROSE 2-4 GM/100ML-% IV SOLN
2.0000 g | INTRAVENOUS | Status: AC
Start: 2024-03-13 — End: 2024-03-13
  Administered 2024-03-13: 2 g via INTRAVENOUS
  Filled 2024-03-13: qty 100

## 2024-03-13 MED ORDER — OXYCODONE HCL 5 MG PO TABS
ORAL_TABLET | ORAL | Status: AC
  Filled 2024-03-13: qty 1

## 2024-03-13 MED ORDER — 0.9 % SODIUM CHLORIDE (POUR BTL) OPTIME
TOPICAL | Status: DC | PRN
  Administered 2024-03-13: 1000 mL

## 2024-03-13 MED ORDER — OXYCODONE HCL 5 MG PO TABS
5.0000 mg | ORAL_TABLET | Freq: Once | ORAL | Status: AC | PRN
  Administered 2024-03-13: 5 mg via ORAL

## 2024-03-13 MED ORDER — SODIUM CHLORIDE 0.9 % IR SOLN
Status: DC | PRN
  Administered 2024-03-13: 3000 mL

## 2024-03-13 SURGICAL SUPPLY — 20 items
BAG URINE DRAIN 2000ML AR STRL (UROLOGICAL SUPPLIES) ×1 IMPLANT
BALLOON NEPHROSTOMY (BALLOONS) IMPLANT
BALLOON OPTILUME DCB 30X5X75 (BALLOONS) ×1 IMPLANT
CATH FOLEY 2W COUNCIL 20FR 5CC (CATHETERS) IMPLANT
CATH FOLEY 2WAY SLVR 5CC 18FR (CATHETERS) IMPLANT
CATH ROBINSON RED A/P 14FR (CATHETERS) IMPLANT
CATH URET 5FR 70CM CONE TIP (BALLOONS) IMPLANT
CATH URETL OPEN 5X70 (CATHETERS) IMPLANT
CATH URETL OPEN END 6FR 70 (CATHETERS) IMPLANT
CLOTH BEACON ORANGE TIMEOUT ST (SAFETY) ×1 IMPLANT
GLOVE BIO SURGEON STRL SZ7.5 (GLOVE) ×1 IMPLANT
GOWN STRL REUS W/ TWL XL LVL3 (GOWN DISPOSABLE) ×1 IMPLANT
GOWN STRL SURGICAL XL XLNG (GOWN DISPOSABLE) ×1 IMPLANT
GUIDEWIRE ANG ZIPWIRE 038X150 (WIRE) IMPLANT
GUIDEWIRE STR DUAL SENSOR (WIRE) ×1 IMPLANT
KIT TURNOVER KIT A (KITS) ×1 IMPLANT
MANIFOLD NEPTUNE II (INSTRUMENTS) IMPLANT
NS IRRIG 1000ML POUR BTL (IV SOLUTION) IMPLANT
PACK CYSTO (CUSTOM PROCEDURE TRAY) ×1 IMPLANT
WATER STERILE IRR 3000ML UROMA (IV SOLUTION) ×1 IMPLANT

## 2024-03-13 NOTE — Discharge Instructions (Addendum)
 You may notice some blood around the catheter.  This is normal.  May have a small amount of blood in the urine.  This is normal as long as the catheter is draining well.  You may occasionally have some urine shoot around the catheter and this is a sign of a bladder spasm which is normal as long as the catheter is draining.  After the catheter comes out, wear a condom with intercourse for 1 month with all partners and for 6 months for all females of reproductive age

## 2024-03-13 NOTE — Anesthesia Procedure Notes (Signed)
 Procedure Name: LMA Insertion Date/Time: 03/13/2024 10:10 AM  Performed by: Nanci Riis, CRNAPre-anesthesia Checklist: Patient identified, Emergency Drugs available, Suction available, Patient being monitored and Timeout performed Patient Re-evaluated:Patient Re-evaluated prior to induction Oxygen Delivery Method: Circle system utilized Preoxygenation: Pre-oxygenation with 100% oxygen Induction Type: IV induction LMA: LMA inserted LMA Size: 5.0 Number of attempts: 1 Placement Confirmation: positive ETCO2 and breath sounds checked- equal and bilateral Tube secured with: Tape Dental Injury: Teeth and Oropharynx as per pre-operative assessment

## 2024-03-13 NOTE — H&P (Signed)
 CC: AUA Questions Scoring.  HPI: Todd Ryan is a 63 year-old male established patient who is here AUA Questions.      CC/HPI: CC: Lower urinary tract symptoms  HPI:  11/04/2023  63 year old male with several year history of lower urinary tract symptoms. Primary complaints are sensation of incomplete bladder emptying but he has a PVR of 1, intermittency, weak stream, mild straining to void, nocturia x 3. Characterizes the quality of life as terrible. Trialed tamsulosin without benefit. Does take sildenafil as needed for erectile dysfunction. Denies nitrates/nitroglycerin.   01/13/2024  Patient started daily tadalafil. Has a little bit of improvement but still has intermittency, weak stream, nocturia 5 or more times. AUA symptom score of 21.     AUA Symptom Score: Less than 50% of the time he has the sensation of not emptying his bladder completely when finished urinating. Less than 50% of the time he has to urinate again fewer than two hours after he has finished urinating. More than 50% of the time he has to start and stop again several times when he urinates. Less than 20% of the time he finds it difficult to postpone urination. Almost always he has a weak urinary stream. Less than 50% of the time he has to push or strain to begin urination. He has to get up to urinate 5 or more times from the time he goes to bed until the time he gets up in the morning.   Calculated AUA Symptom Score: 21    ALLERGIES: None   MEDICATIONS: Tamsulosin HCl 0.4 MG Capsule  Albuterol Sulfate  amLODIPine  Besylate  Atorvastatin  Calcium   busPIRone  HCl  Cetirizine  HCl  Fluticasone  Propionate  Meloxicam   oxyCODONE -Acetaminophen   Sertraline  HCl  Sildenafil Citrate  Tadalafil 5 MG Tablet 1 tablet PO Daily  Tylenol   Vitamin D      GU PSH: No GU PSH    NON-GU PSH: Back surgery Shoulder Arthroscopy/surgery Visit Complexity (formerly [facility]) - 11/04/2023     GU PMH: BPH w/LUTS - 11/04/2023 ED due to  arterial insufficiency - 11/04/2023 Nocturia - 11/04/2023 Straining on Urination - 11/04/2023 Urinary Hesitancy - 11/04/2023 Weak Urinary Stream - 11/04/2023    NON-GU PMH: Acute gastric ulcer with hemorrhage Anxiety Arthritis Asthma Depression Diabetes Type 2 GERD Hypercholesterolemia Hypertension Sleep Apnea    FAMILY HISTORY: No Family History    SOCIAL HISTORY: No Social History    REVIEW OF SYSTEMS:    GU Review Male:   Patient reports frequent urination, hard to postpone urination, get up at night to urinate, leakage of urine, stream starts and stops, trouble starting your stream, and have to strain to urinate . Patient denies burning/ pain with urination, erection problems, and penile pain.  Gastrointestinal (Upper):   Patient denies nausea, vomiting, and indigestion/ heartburn.  Gastrointestinal (Lower):   Patient denies diarrhea and constipation.  Constitutional:   Patient reports fatigue. Patient denies fever, night sweats, and weight loss.  Skin:   Patient denies skin rash/ lesion and itching.  Eyes:   Patient denies blurred vision and double vision.  Ears/ Nose/ Throat:   Patient denies sore throat and sinus problems.  Hematologic/Lymphatic:   Patient denies swollen glands and easy bruising.  Cardiovascular:   Patient denies leg swelling and chest pains.  Respiratory:   Patient denies cough and shortness of breath.  Endocrine:   Patient denies excessive thirst.  Musculoskeletal:   Patient denies back pain and joint pain.  Neurological:   Patient denies headaches and dizziness.  Psychologic:   Patient denies depression and anxiety.   VITAL SIGNS: None   Complexity of Data:  Source Of History:  Patient  Records Review:   AUA Symptom Score, Previous Doctor Records, Previous Patient Records  Urine Test Review:   Urinalysis   PROCEDURES:         Flexible Cystoscopy - 52000  Risks, benefits, and some of the potential complications of the procedure were discussed at length  with the patient including infection, bleeding, voiding discomfort, urinary retention, fever, chills, sepsis, and others. All questions were answered. Informed consent was obtained. Antibiotic prophylaxis was given. Sterile technique and intraurethral analgesia were used.  Meatus:  Normal size. Normal location. Normal condition.  Urethra:  Patient has 2 bulbar urethral strictures that are dense and pinpoint      The lower urinary tract was carefully examined. The procedure was well-tolerated and without complications. Antibiotic instructions were given. Instructions were given to call the office immediately for bloody urine, difficulty urinating, urinary retention, painful or frequent urination, fever, chills, nausea, vomiting or other illness. The patient stated that he understood these instructions and would comply with them.         Urinalysis w/Scope Dipstick Dipstick Cont'd Micro  Color: Yellow Bilirubin: Neg mg/dL WBC/hpf: 6 - 89/yeq  Appearance: Clear Ketones: Neg mg/dL RBC/hpf: NS (Not Seen)  Specific Gravity: 1.020 Blood: Neg ery/uL Bacteria: Few (10-25/hpf)  pH: 5.5 Protein: Trace mg/dL Cystals: NS (Not Seen)  Glucose: Neg mg/dL Urobilinogen: 0.2 mg/dL Casts: NS (Not Seen)    Nitrites: Neg Trichomonas: Not Present    Leukocyte Esterase: Trace leu/uL Mucous: Not Present      Epithelial Cells: 0 - 5/hpf      Yeast: NS (Not Seen)      Sperm: Not Present    ASSESSMENT:      ICD-10 Details  1 GU:   Bulbar urethral stricture - N35.011 Undiagnosed New Problem  2   Nocturia - R35.1 Chronic, Stable  3   Urinary Hesitancy - R39.11 Chronic, Stable  4   Weak Urinary Stream - R39.12 Chronic, Stable     PLAN:           Document Letter(s):  Created for Patient: Clinical Summary         Notes:   Recommend cystoscopy with direct visual internal urethrotomy and Optilume urethral dilation. Risk and benefits discussed including but not limited to bleeding, infection, injury to surrounding  structures, incontinence, need for additional procedures, recurrence.   CC: Dr. Shelda    Signed by Sherwood Edison, III, M.D. on 01/13/24 at 9:42 AM (EDT

## 2024-03-13 NOTE — Transfer of Care (Signed)
 Immediate Anesthesia Transfer of Care Note  Patient: Todd Ryan  Procedure(s) Performed: CYSTOSCOPY, WITH DIRECT VISION INTERNAL URETHROTOMY (Urethra) CYSTOURETHROSCOPY, WITH URETHRAL STRICTURE DILATION USING DRUG-COATED BALLOON (Urethra)  Patient Location: PACU  Anesthesia Type:General  Level of Consciousness: drowsy and patient cooperative  Airway & Oxygen Therapy: Patient Spontanous Breathing  Post-op Assessment: Report given to RN and Post -op Vital signs reviewed and stable  Post vital signs: Reviewed and stable  Last Vitals:  Vitals Value Taken Time  BP    Temp    Pulse 71 03/13/24 10:49  Resp 19 03/13/24 10:49  SpO2 89 % 03/13/24 10:49  Vitals shown include unfiled device data.  Last Pain:  Vitals:   03/13/24 0815  TempSrc:   PainSc: 0-No pain         Complications: No notable events documented.

## 2024-03-13 NOTE — Interval H&P Note (Signed)
 History and Physical Interval Note:  03/13/2024 9:18 AM  Belvie NOVAK Deschepper  has presented today for surgery, with the diagnosis of URETHRAL STRICTURE.  The various methods of treatment have been discussed with the patient and family. After consideration of risks, benefits and other options for treatment, the patient has consented to  Procedure(s) with comments: CYSTOSCOPY, WITH DIRECT VISION INTERNAL URETHROTOMY (N/A) CYSTOURETHROSCOPY, WITH URETHRAL STRICTURE DILATION USING DRUG-COATED BALLOON (N/A) - OPTILUME as a surgical intervention.  The patient's history has been reviewed, patient examined, no change in status, stable for surgery.  I have reviewed the patient's chart and labs.  Questions were answered to the patient's satisfaction.     Sherwood JONETTA Edison, III

## 2024-03-13 NOTE — Op Note (Signed)
 Operative Note  Preoperative diagnosis:  1.  Bulbar urethral stricture  Postoperative diagnosis: 1.  Bulbar urethral stricture  Procedure(s): 1.  Cystoscopy with direct visual internal urethrotomy 2.  Optilume urethral dilation  Surgeon: Sherwood Edison, MD  Assistants: None  Anesthesia: General  Complications: None immediate  EBL: Minimal  Specimens: 1.  None  Drains/Catheters: 1.  18 French Foley catheter  Intraoperative findings: Pinpoint bulbar urethral stricture about 1 cm in length that was quite dense.  Had a short minimally obstructing prostate with minimal bilobar hypertrophy.  Bladder mucosa with mild trabeculation but no tumors, masses, or stones  Indication: 63 year old male with bulbar urethral stricture presents for Optilume.  Description of procedure:  The patient was identified and consent was obtained.  The patient was taken to the operating room and placed in the supine position.  The patient was placed under general anesthesia.  Perioperative antibiotics were administered.  The patient was placed in dorsal lithotomy.  Patient was prepped and draped in a standard sterile fashion and a timeout was performed.  A 21 French rigid cystoscope was advanced into the urethra up to the stricture.  A wire was passed through the stricture and into the bladder.  Scope was withdrawn.  Rigid cystoscope was advanced into the bladder and cold knife was advanced and incised the stricture at 12:00.  It was then wide open.  I passed the scope past the stricture and into the bladder.  Complete cystoscopy was performed with findings noted above.  I withdrew the scope and marked the proximal and distal portions of the stricture with fluoroscopy.  I then advanced the Optilume balloon over the wire and situated it across the stricture fluoroscopically and inflated the balloon to a pressure of 10.  This remained inflated for about 7 minutes.  I then deflated the balloon and remove that and  the wire.  18 French Foley catheter was placed.  This concluded the operation.  Patient tolerated the procedure well was stable postoperatively.  Plan: Follow-up in 5 to 7 days for catheter removal

## 2024-03-14 ENCOUNTER — Ambulatory Visit: Payer: Self-pay | Admitting: Sports Medicine

## 2024-03-14 ENCOUNTER — Encounter (HOSPITAL_COMMUNITY): Payer: Self-pay | Admitting: Urology

## 2024-03-14 NOTE — Anesthesia Postprocedure Evaluation (Signed)
 Anesthesia Post Note  Patient: Thom Ollinger Eckman  Procedure(s) Performed: CYSTOSCOPY, WITH DIRECT VISION INTERNAL URETHROTOMY (Urethra) CYSTOURETHROSCOPY, WITH URETHRAL STRICTURE DILATION USING DRUG-COATED BALLOON (Urethra)     Patient location during evaluation: PACU Anesthesia Type: General Level of consciousness: awake and alert, oriented and patient cooperative Pain management: pain level controlled Vital Signs Assessment: post-procedure vital signs reviewed and stable Respiratory status: spontaneous breathing, nonlabored ventilation and respiratory function stable Cardiovascular status: blood pressure returned to baseline and stable Postop Assessment: no apparent nausea or vomiting Anesthetic complications: no   No notable events documented.  Last Vitals:  Vitals:   03/13/24 1154 03/13/24 1200  BP: (!) (P) 153/96 138/88  Pulse:  (!) 51  Resp: (P) 15   Temp:  36.6 C  SpO2: (P) 92% 96%    Last Pain:  Vitals:   03/13/24 1200  TempSrc:   PainSc: 3                  Almarie CHRISTELLA Marchi

## 2024-03-20 ENCOUNTER — Ambulatory Visit: Admitting: Orthopedic Surgery

## 2024-03-21 ENCOUNTER — Ambulatory Visit: Admitting: Sports Medicine

## 2024-03-21 ENCOUNTER — Encounter: Payer: Self-pay | Admitting: Sports Medicine

## 2024-03-21 DIAGNOSIS — M1712 Unilateral primary osteoarthritis, left knee: Secondary | ICD-10-CM | POA: Diagnosis not present

## 2024-03-21 DIAGNOSIS — M7122 Synovial cyst of popliteal space [Baker], left knee: Secondary | ICD-10-CM

## 2024-03-21 MED ORDER — DICLOFENAC SODIUM 75 MG PO TBEC: 75.0000 mg | DELAYED_RELEASE_TABLET | Freq: Two times a day (BID) | ORAL | 1 refills | Status: DC

## 2024-03-21 NOTE — Progress Notes (Signed)
 Patient says that he had surgery so was off of his leg for a week and his knee felt better during that time. He says that he resumed his regular activities yesterday, and being on the leg more did lead to return of pain. He is here today for MRI review.

## 2024-03-21 NOTE — Progress Notes (Signed)
 Todd Ryan - 64 y.o. male MRN [id number]  Date of birth: [birth year]  Office Visit Note: Visit Date: 03/21/2024 PCP: Shelda Atlas, MD Referred by: Shelda Atlas, MD  Subjective: Chief Complaint  Patient presents with   Left Knee - Follow-up   HPI: Todd Ryan is a pleasant 63 y.o. male who presents today for follow-up of left knee knee, f/u MRI.  Left knee has been doing better, he was off the leg for a week or so after he had a separate surgery.  As he has resumed activities he felt some more pain in the knee.  He still has swelling in the knee and this waxes and wanes with activity.  He was unable to pick up the diclofenac  medication as he states the pharmacy said it was not there.  Did do physical therapy but questions the need for this.  As reminder, he had nearly 100% pain relief from previous CSI injection 2 months ago.  Pertinent ROS were reviewed with the patient and found to be negative unless otherwise specified above in HPI.   Assessment & Plan: Visit Diagnoses:  1. Unilateral primary osteoarthritis, left knee   2. Baker's cyst, left    Plan: Impression is acute on chronic left knee pain, we did review MRI which fortunately did not show any high-grade cartilage loss or meniscal tearing.  He does have at least mild arthritic change with underlying cartilage degeneration and a reactive effusion with Baker's cyst.  We discussed the importance of physical therapy to help strengthen and stabilize the knee to offload the joint itself, this may perfect sense to him and he was agreeable to proceeding with physical therapy now that he knows the benefit of this.  I did resend his oral Voltaren  which he may take once to twice daily with food.  Will see him back in 1 month to see how he responds to the above treatment, could consider injection or and/or aspiration at that visit if needed.  Follow-up: Return in about 1 month (around 04/20/2024) for Left knee .   Meds &  Orders:  Meds ordered this encounter  Medications   diclofenac  (VOLTAREN ) 75 MG EC tablet    Sig: Take 1 tablet (75 mg total) by mouth 2 (two) times daily.    Dispense:  40 tablet    Refill:  1   No orders of the defined types were placed in this encounter.    Procedures: No procedures performed      Clinical History: No specialty comments available.  He reports that he has been smoking cigarettes. He has never used smokeless tobacco. No results for input(s): HGBA1C, LABURIC in the last 8760 hours.  Objective:    Physical Exam  Gen: Well-appearing, in no acute distress; non-toxic CV: Well-perfused. Warm.  Resp: Breathing unlabored on room air; no wheezing. Psych: Fluid speech in conversation; appropriate affect; normal thought process  Ortho Exam - Left knee: There is a mild effusion about the left knee and fullness of the posterior fossa, likely indicative of a Baker's cyst.  Range of motion 0-125 degrees with pain at endrange flexion.  No varus or valgus instability.  Imaging:  DG C-Arm 1-60 Min-No Report Fluoroscopy was utilized by the requesting physician.  No radiographic  interpretation.  MR Knee Left w/o contrast MR KNEE WITHOUT IV CONTRAST LEFT  COMPARISON: None.  CLINICAL HISTORY: Meniscal injury, knee. Left knee pain.  PULSE SEQUENCES: Ax PD FS, Sag T2 ACL, Sag PD FS, Cor  PD FS & COR T1  FINDINGS: Bones: There is no fracture or contusion pattern. Mild tricompartmental osteoarthrosis is present. There is a joint effusion with a moderate-sized Baker's cyst. The extensor mechanism is intact.  Ligaments: The ACL, PCL, MCL and fibular collateral ligament are intact.  Menisci: The medial and lateral menisci are unremarkable.  IMPRESSION: Mild degenerative arthrosis without full-thickness cartilage loss. There is moderate reactive joint effusion and moderate Baker's cyst.  No meniscal or ligament injury is appreciated.  Electronically signed by:  Norleen Satchel MD 03/13/2024 09:18 AM EDT RP Workstation: [id number]  Past Medical/Family/Surgical/Social History: Medications & Allergies reviewed per EMR, new medications updated. Patient Active Problem List   Diagnosis Date Noted   Body mass index (BMI) 32.0-32.9, adult 02/19/2021   Right shoulder pain 02/19/2021   Neck pain 11/21/2020   DDD (degenerative disc disease), lumbosacral 10/20/2019   TIA (transient ischemic attack) 06/02/2018   Hypertension 06/02/2018   Complication of surgical procedure 06/03/2015   Displacement of lumbar intervertebral disc without myelopathy 08/27/2014   Lower back injury 12/07/2013   Past Medical History:  Diagnosis Date   Allergic rhinitis    Arthritis    shoulder, neck, back   Asthma    Chronic back pain    GAD (generalized anxiety disorder)    GERD (gastroesophageal reflux disease)    History of asthma    child   Hyperlipidemia    Hypertension    MDD (major depressive disorder)    OSA on CPAP    07-15-2020  per pt uses approx. 3 times weekly   Peripheral neuropathy    Pre-diabetes    Rotator cuff tear, right    Wears glasses    Family History  Problem Relation Age of Onset   Hypertension Mother    CAD Father    Colon cancer Neg Hx    Esophageal cancer Neg Hx    Rectal cancer Neg Hx    Stomach cancer Neg Hx    Past Surgical History:  Procedure Laterality Date   COLONOSCOPY WITH PROPOFOL   05/31/2020   CYSTOSCOPY WITH DIRECT VISION INTERNAL URETHROTOMY N/A 03/13/2024   Procedure: CYSTOSCOPY, WITH DIRECT VISION INTERNAL URETHROTOMY;  Surgeon: Carolee Sherwood JONETTA DOUGLAS, MD;  Location: [facility];  Service: Urology;  Laterality: N/A;   CYSTOURETHROSCOPY, W/ URETHRAL STRICTURE DILATION USING DRUG-COATED BALLOON N/A 03/13/2024   Procedure: CYSTOURETHROSCOPY, WITH URETHRAL STRICTURE DILATION USING DRUG-COATED BALLOON;  Surgeon: Carolee Sherwood JONETTA DOUGLAS, MD;  Location: [facility];  Service: Urology;  Laterality: N/A;  OPTILUME   LUMBAR SPINE SURGERY  11/2014    SHOULDER ARTHROSCOPY WITH ROTATOR CUFF REPAIR AND SUBACROMIAL DECOMPRESSION Right 07/16/2020   Procedure: SHOULDER ARTHROSCOPY WITH ROTATOR CUFF REPAIR AND SUBACROMIAL DECOMPRESSION;  Surgeon: Beverley Evalene JONETTA, MD;  Location: [facility];  Service: Orthopedics;  Laterality: Right;   SHOULDER ARTHROSCOPY WITH ROTATOR CUFF REPAIR AND SUBACROMIAL DECOMPRESSION Right 05/05/2022   Procedure: SHOULDER ARTHROSCOPY WITH ROTATOR CUFF REPAIR AND SUBACROMIAL DECOMPRESSION. LOOSE BODYEXCISION;  Surgeon: Beverley Evalene JONETTA, MD;  Location: [facility];  Service: Orthopedics;  Laterality: Right;   Social History   Occupational History   Not on file  Tobacco Use   Smoking status: Some Days    Types: Cigarettes   Smokeless tobacco: Never   Tobacco comments:    07-15-2020 per pt 1 ppmonth  Vaping Use   Vaping status: Never Used  Substance and Sexual Activity   Alcohol use: Yes    Comment: occas   Drug use:  Never   Sexual activity: Yes

## 2024-04-20 ENCOUNTER — Ambulatory Visit (INDEPENDENT_AMBULATORY_CARE_PROVIDER_SITE_OTHER): Admitting: Sports Medicine

## 2024-04-20 ENCOUNTER — Encounter: Payer: Self-pay | Admitting: Sports Medicine

## 2024-04-20 ENCOUNTER — Other Ambulatory Visit: Payer: Self-pay

## 2024-04-20 DIAGNOSIS — M25561 Pain in right knee: Secondary | ICD-10-CM

## 2024-04-20 DIAGNOSIS — G8929 Other chronic pain: Secondary | ICD-10-CM

## 2024-04-20 DIAGNOSIS — M7122 Synovial cyst of popliteal space [Baker], left knee: Secondary | ICD-10-CM | POA: Diagnosis not present

## 2024-04-20 DIAGNOSIS — M25562 Pain in left knee: Secondary | ICD-10-CM | POA: Diagnosis not present

## 2024-04-20 DIAGNOSIS — M1712 Unilateral primary osteoarthritis, left knee: Secondary | ICD-10-CM

## 2024-04-20 MED ORDER — METHYLPREDNISOLONE ACETATE 40 MG/ML IJ SUSP
40.0000 mg | INTRAMUSCULAR | Status: AC | PRN
  Administered 2024-04-20: 40 mg via INTRA_ARTICULAR

## 2024-04-20 MED ORDER — BUPIVACAINE HCL 0.25 % IJ SOLN
1.0000 mL | INTRAMUSCULAR | Status: AC | PRN
  Administered 2024-04-20: 1 mL via INTRA_ARTICULAR

## 2024-04-20 MED ORDER — LIDOCAINE HCL 1 % IJ SOLN
3.0000 mL | INTRAMUSCULAR | Status: AC | PRN
  Administered 2024-04-20: 3 mL

## 2024-04-20 NOTE — Progress Notes (Signed)
 Patient says that his knee is feeling about the same as it was at his visit one month ago. He is still feeling overall better than his very first visit. He has not gotten into physical therapy yet as he did not hear from anybody to schedule the appointment. He is still interested in doing physical therapy and will need a new referral sent today.

## 2024-04-20 NOTE — Progress Notes (Signed)
 Todd Ryan - 63 y.o. male MRN [id number]  Date of birth: 09/19/1960  Office Visit Note: Visit Date: 04/20/2024 PCP: Shelda Atlas, MD Referred by: Shelda Atlas, MD  Subjective: Chief Complaint  Patient presents with   Left Knee - Follow-up   HPI: Todd Ryan is a pleasant 63 y.o. male who presents today for follow-up of left knee pain.  The knee is overall still doing better, but similar to what it was at the last visit. He was unable to get started in formalized physical therapy, but still would like to do so. He has been taking oral Voltaren  75mg  1-2x daily but no longer noticing a big difference. Does feel some pressure in the back part of the knee as well. Right knee doing well, but would still benefit from b/l knee PT.  Pertinent ROS were reviewed with the patient and found to be negative unless otherwise specified above in HPI.   Assessment & Plan: Visit Diagnoses:  1. Unilateral primary osteoarthritis, left knee   2. Baker's cyst, left   3. Chronic pain of both knees    Plan: Impression is left > right knee pain with bilateral mild osteoarthritis with left knee patellofemoral disorder. He had essentially 100% relief of pain in the left knee from previous CSI many months ago, but this only lasted about 2 months. Now with better, but returned pain and reactive Baker's cyst causing pressure/discomfort in posterior knee. Through shared-decision making we did proceed with US  for visualiztion and then subsequent aspiration/injection of the Baker's cyst. Patient tolerated well, advised on post-injection protocol, will keep compression on for 48-72 hours. Given that oral diclofenac  75mg  1-2x/daily has not made a big difference, we will have him discontinue this at this time to reduce side effect burden. Fine for OTC anti-inflammatories as needed. New referral sent for physical therapy to work on strenghthening and stabilization of both knees. We will f/u in 2 months to  re-evaluate and see his progress.  Follow-up: Return in about 2 months (around 06/20/2024) for L > R knee.   Meds & Orders: No orders of the defined types were placed in this encounter.   Orders Placed This Encounter  Procedures   Large Joint Inj: L knee   US  Extrem Low Left Ltd   Ambulatory referral to Physical Therapy     Procedures: Large Joint Inj: L knee on 04/20/2024 9:20 AM Indications: pain and joint swelling Details: 18 G 1.5 in needle, posterior approach Medications: 3 mL lidocaine  1 %; 1 mL bupivacaine  0.25 %; 40 mg methylPREDNISolone  acetate 40 MG/ML Aspirate: 13 mL clear and yellow Outcome: tolerated well, no immediate complications  After discussion on risks/benefits/indications, informed verbal consent was obtained and a timeout was performed. The patient was lying prone on exam table and the patient's knee was prepped with Chloraprep and alcohol swabs. Utilizing ultrasound-guidance with the probe in a longitudinal position, the Baker's cyst was identified and the surrounding soft tissue area was anesthesized first with a 25G, 1.5 needle with approximately 3 cc of lidocaine  1% using sterile technique. Following this, using ultrasound guidance via an in-plane approach, an 18G, 1.5 needle was directed into cyst and approximately 13 cc's of clear, yellow fluid was aspirated from the cyst and posterior knee joint. Utilizing the same portal and ultrasound-guidance, a 1:1 bupivicaine:depo-medrol  injectate was administered into the aspirated cyst space. Patient tolerated the procedure well without immediate complications.  Procedure, treatment alternatives, risks and benefits explained, specific risks discussed. Consent was given  by the patient. Immediately prior to procedure a time out was called to verify the correct patient, procedure, equipment, support staff and site/side marked as required. Patient was prepped and draped in the usual sterile fashion.          Clinical  History: No specialty comments available.  He reports that he has been smoking cigarettes. He has never used smokeless tobacco. No results for input(s): HGBA1C, LABURIC in the last 8760 hours.  Objective:    Physical Exam  Gen: Well-appearing, in no acute distress; non-toxic CV: Well-perfused. Warm.  Resp: Breathing unlabored on room air; no wheezing. Psych: Fluid speech in conversation; appropriate affect; normal thought process  Ortho Exam - Left knee: Trace knee effusion with fullness in posterior knee, indicative of Baker's cyst. Range of motion is slightly limited from 0-125 degrees before blocking and some associated pain. No varus/valgus instability. No swelling of right knee with ROM 0-130 degrees.  Imaging: US  Extrem Low Left Ltd Result Date: 04/20/2024 Limited MSK Ultrasound of the left posterior knee was performed today. Appropriate neurovascular was identified without abnormality, popliteal artery and veins. Between the [facility] and semintendinosus tendons is a large hypoechoic fluid pocket which is fluctuant. This is indicative of a moderate to large Baker's cyst. No significant doppler uptake. This is approximately 8 x 4.5 cm in size and approximately 10 cm in circumference. No bony abnormality noted of posterior condyles. *Procedurally successful US -guided baker's cyst aspiration and injection       01/19/24: 2 view x-ray of the left and right knees including bilateral standing AP and  Cecillia views were ordered and reviewed by myself today.  X-rays  demonstrate at least mild medial tibiofemoral joint space narrowing  bilaterally.  The left knee does fall into a very slight valgus fashion.   No acute fracture noted.  Possibly small joint effusion noted of the left  knee.   Past Medical/Family/Surgical/Social History: Medications & Allergies reviewed per EMR, new medications updated. Patient Active Problem List   Diagnosis Date Noted   Body mass index (BMI)  32.0-32.9, adult 02/19/2021   Right shoulder pain 02/19/2021   Neck pain 11/21/2020   DDD (degenerative disc disease), lumbosacral 10/20/2019   TIA (transient ischemic attack) 06/02/2018   Hypertension 06/02/2018   Complication of surgical procedure 06/03/2015   Displacement of lumbar intervertebral disc without myelopathy 08/27/2014   Lower back injury 12/07/2013   Past Medical History:  Diagnosis Date   Allergic rhinitis    Arthritis    shoulder, neck, back   Asthma    Chronic back pain    GAD (generalized anxiety disorder)    GERD (gastroesophageal reflux disease)    History of asthma    child   Hyperlipidemia    Hypertension    MDD (major depressive disorder)    OSA on CPAP    07-15-2020  per pt uses approx. 3 times weekly   Peripheral neuropathy    Pre-diabetes    Rotator cuff tear, right    Wears glasses    Family History  Problem Relation Age of Onset   Hypertension Mother    CAD Father    Colon cancer Neg Hx    Esophageal cancer Neg Hx    Rectal cancer Neg Hx    Stomach cancer Neg Hx    Past Surgical History:  Procedure Laterality Date   COLONOSCOPY WITH PROPOFOL   05/31/2020   CYSTOSCOPY WITH DIRECT VISION INTERNAL URETHROTOMY N/A 03/13/2024   Procedure: CYSTOSCOPY, WITH DIRECT VISION INTERNAL  URETHROTOMY;  Surgeon: Carolee Sherwood JONETTA DOUGLAS, MD;  Location: [facility];  Service: Urology;  Laterality: N/A;   CYSTOURETHROSCOPY, W/ URETHRAL STRICTURE DILATION USING DRUG-COATED BALLOON N/A 03/13/2024   Procedure: CYSTOURETHROSCOPY, WITH URETHRAL STRICTURE DILATION USING DRUG-COATED BALLOON;  Surgeon: Carolee Sherwood JONETTA DOUGLAS, MD;  Location: [facility];  Service: Urology;  Laterality: N/A;  OPTILUME   LUMBAR SPINE SURGERY  11/2014   SHOULDER ARTHROSCOPY WITH ROTATOR CUFF REPAIR AND SUBACROMIAL DECOMPRESSION Right 07/16/2020   Procedure: SHOULDER ARTHROSCOPY WITH ROTATOR CUFF REPAIR AND SUBACROMIAL DECOMPRESSION;  Surgeon: Beverley Evalene JONETTA, MD;  Location: [facility];   Service: Orthopedics;  Laterality: Right;   SHOULDER ARTHROSCOPY WITH ROTATOR CUFF REPAIR AND SUBACROMIAL DECOMPRESSION Right 05/05/2022   Procedure: SHOULDER ARTHROSCOPY WITH ROTATOR CUFF REPAIR AND SUBACROMIAL DECOMPRESSION. LOOSE BODYEXCISION;  Surgeon: Beverley Evalene JONETTA, MD;  Location: [facility];  Service: Orthopedics;  Laterality: Right;   Social History   Occupational History   Not on file  Tobacco Use   Smoking status: Some Days    Types: Cigarettes   Smokeless tobacco: Never   Tobacco comments:    07-15-2020 per pt 1 ppmonth  Vaping Use   Vaping status: Never Used  Substance and Sexual Activity   Alcohol use: Yes    Comment: occas   Drug use: Never   Sexual activity: Yes

## 2024-05-01 ENCOUNTER — Encounter: Payer: Self-pay | Admitting: Radiology

## 2024-05-09 ENCOUNTER — Other Ambulatory Visit: Payer: Self-pay

## 2024-05-09 ENCOUNTER — Ambulatory Visit: Attending: Sports Medicine

## 2024-05-09 DIAGNOSIS — M6281 Muscle weakness (generalized): Secondary | ICD-10-CM | POA: Insufficient documentation

## 2024-05-09 DIAGNOSIS — R2689 Other abnormalities of gait and mobility: Secondary | ICD-10-CM | POA: Insufficient documentation

## 2024-05-09 DIAGNOSIS — M1712 Unilateral primary osteoarthritis, left knee: Secondary | ICD-10-CM | POA: Insufficient documentation

## 2024-05-09 DIAGNOSIS — M25562 Pain in left knee: Secondary | ICD-10-CM | POA: Insufficient documentation

## 2024-05-09 DIAGNOSIS — G8929 Other chronic pain: Secondary | ICD-10-CM | POA: Insufficient documentation

## 2024-05-09 DIAGNOSIS — M25561 Pain in right knee: Secondary | ICD-10-CM | POA: Insufficient documentation

## 2024-05-09 NOTE — Therapy (Signed)
 OUTPATIENT PHYSICAL THERAPY LOWER EXTREMITY EVALUATION   Patient Name: Todd Ryan MRN: [id number] DOB:[birth year], 63 y.o., male Today's Date: 05/09/2024  END OF SESSION:  PT End of Session - 05/09/24 0835     Visit Number 1    Number of Visits 14    Date for Recertification  06/20/24    Authorization Type [employer]    PT Start Time 0800    PT Stop Time 0830    PT Time Calculation (min) 30 min    Activity Tolerance Patient tolerated treatment well    Behavior During Therapy WFL for tasks assessed/performed           Past Medical History:  Diagnosis Date   Allergic rhinitis    Arthritis    shoulder, neck, back   Asthma    Chronic back pain    GAD (generalized anxiety disorder)    GERD (gastroesophageal reflux disease)    History of asthma    child   Hyperlipidemia    Hypertension    MDD (major depressive disorder)    OSA on CPAP    07-15-2020  per pt uses approx. 3 times weekly   Peripheral neuropathy    Pre-diabetes    Rotator cuff tear, right    Wears glasses    Past Surgical History:  Procedure Laterality Date   COLONOSCOPY WITH PROPOFOL   05/31/2020   CYSTOSCOPY WITH DIRECT VISION INTERNAL URETHROTOMY N/A 03/13/2024   Procedure: CYSTOSCOPY, WITH DIRECT VISION INTERNAL URETHROTOMY;  Surgeon: Carolee Sherwood JONETTA DOUGLAS, MD;  Location: [facility];  Service: Urology;  Laterality: N/A;   CYSTOURETHROSCOPY, W/ URETHRAL STRICTURE DILATION USING DRUG-COATED BALLOON N/A 03/13/2024   Procedure: CYSTOURETHROSCOPY, WITH URETHRAL STRICTURE DILATION USING DRUG-COATED BALLOON;  Surgeon: Carolee Sherwood JONETTA DOUGLAS, MD;  Location: [facility];  Service: Urology;  Laterality: N/A;  OPTILUME   LUMBAR SPINE SURGERY  11/2014   SHOULDER ARTHROSCOPY WITH ROTATOR CUFF REPAIR AND SUBACROMIAL DECOMPRESSION Right 07/16/2020   Procedure: SHOULDER ARTHROSCOPY WITH ROTATOR CUFF REPAIR AND SUBACROMIAL DECOMPRESSION;  Surgeon: Beverley Evalene JONETTA, MD;  Location[facility];  Service: Orthopedics;   Laterality: Right;   SHOULDER ARTHROSCOPY WITH ROTATOR CUFF REPAIR AND SUBACROMIAL DECOMPRESSION Right 05/05/2022   Procedure: SHOULDER ARTHROSCOPY WITH ROTATOR CUFF REPAIR AND SUBACROMIAL DECOMPRESSION. LOOSE BODYEXCISION;  Surgeon: Beverley Evalene JONETTA, MD;  Location: [facility];  Service: Orthopedics;  Laterality: Right;   Patient Active Problem List   Diagnosis Date Noted   Body mass index (BMI) 32.0-32.9, adult 02/19/2021   Right shoulder pain 02/19/2021   Neck pain 11/21/2020   DDD (degenerative disc disease), lumbosacral 10/20/2019   TIA (transient ischemic attack) 06/02/2018   Hypertension 06/02/2018   Complication of surgical procedure 06/03/2015   Displacement of lumbar intervertebral disc without myelopathy 08/27/2014   Lower back injury 12/07/2013    PCP: Shelda Atlas, MD  REFERRING PROVIDER: Burnetta Brunet, DO  REFERRING DIAG:  [phone] (ICD-10-CM) - Unilateral primary osteoarthritis, left knee M25.562,G89.29 (ICD-10-CM) - Chronic patellofemoral pain of left knee M25.561,M25.562,G89.29 (ICD-10-CM) - Chronic pain of both knees  THERAPY DIAG:  Chronic pain of left knee  Chronic pain of right knee  Muscle weakness (generalized)  Other abnormalities of gait and mobility  Rationale for Evaluation and Treatment: Rehabilitation  ONSET DATE: Chronic  SUBJECTIVE:   SUBJECTIVE STATEMENT: Pt presents to PT with reports of chronic bilateral knee pain L>R. Previously evaluated by PT earlier this week for similar symptoms. Had baker's cyst drained from L knee and notes  improved symptoms post. Has occasional buckling to L knee, especially on inclines or steps.   PERTINENT HISTORY: HTN   PAIN:  Are you having pain? Yes: NPRS scale: 2/10 Worst: 8/10 Pain location: bilateral anterior knee L>R Pain description: sharp and shooting Aggravating factors: stairs, standing, walking, sitting,  Relieving factors: rest, ice  PRECAUTIONS: None  RED  FLAGS: None   WEIGHT BEARING RESTRICTIONS: No  FALLS:  Has patient fallen in last 6 months? No  LIVING ENVIRONMENT: Lives with: lives with their spouse Lives in: House/apartment Stairs: 10 steps in home - handrails bilaterally Has following equipment at home: None  OCCUPATION: part time exterminator   PLOF: Independent currently limited in some activities   PATIENT GOALS: get back to running and jumping, possibly playing basketball  NEXT MD VISIT: 06/20/2024  OBJECTIVE:  Note: Objective measures were completed at Evaluation unless otherwise noted.  DIAGNOSTIC FINDINGS: mild medial tibiofemoral joint space narrowing bilaterally   PATIENT SURVEYS:   Extreme difficulty/unable (0), Quite a bit of difficulty (1), Moderate difficulty (2), Little difficulty (3), No difficulty (4) Survey date:  05/09/24  Any of your usual work, housework or school activities 1  2. Usual hobbies, recreational or sporting activities 1  3. Getting into/out of the bath 1  4. Walking between rooms 1  5. Putting on socks/shoes 1  6. Squatting  1  7. Lifting an object, like a bag of groceries from the floor 1  8. Performing light activities around your home 2  9. Performing heavy activities around your home 1  10. Getting into/out of a car 1  11. Walking 2 blocks 1  12. Walking 1 mile 1  13. Going up/down 10 stairs (1 flight) 0  14. Standing for 1 hour 0  15.  sitting for 1 hour 0  16. Running on even ground 0  17. Running on uneven ground 0  18. Making sharp turns while running fast 0  19. Hopping  0  20. Rolling over in bed 1  Score total:  14     COGNITION: Overall cognitive status: Within functional limits for tasks assessed    SENSATION: Not tested  EDEMA L knee swelling noted greater than right  MUSCLE LENGTH: Hamstrings: Right 40 deg ; Left 30 deg  POSTURE: No Significant postural limitations  PALPATION: TTP to distal L quad, L patellar hypomobility  LOWER EXTREMITY  ROM:  Active ROM Right eval Left eval  Hip flexion    Hip extension    Hip abduction    Hip adduction    Hip internal rotation    Hip external rotation    Knee flexion 125 110  Knee extension 0 0  Ankle dorsiflexion    Ankle plantarflexion    Ankle inversion    Ankle eversion     (Blank rows = not tested)  LOWER EXTREMITY MMT:  MMT Right eval Left eval  Hip flexion 4 4  Hip extension    Hip abduction 4 4  Hip adduction    Hip internal rotation    Hip external rotation    Knee flexion 5 5  Knee extension 5 4  Ankle dorsiflexion    Ankle plantarflexion    Ankle inversion    Ankle eversion     (Blank rows = not tested)  LOWER EXTREMITY SPECIAL TESTS:  DNT  FUNCTIONAL TESTS:  30 sec STS: 8 reps no UE   GAIT: Distance walked: 461ft Assistive device utilized: None Level of assistance: Complete Independence Comments:  none                                                                                                                               TREATMENT DATE:  [facility] Adult PT Treatment:                                                DATE: 01/27/2024 Therapeutic Exercise: Supine QS x 5 - 5 hold Supine SLR x 10 S/L clamshell x 10 GTB S/L hip abd x 10  Eccentric heel tap x 5 - 4in step  PATIENT EDUCATION:  Education details: eval findings, LEFS, HEP, POC  [place] educated: Patient Education method: Explanation, Demonstration, Verbal cues, and Handouts Education comprehension: verbalized understanding and returned demonstration  HOME EXERCISE PROGRAM: Access Code: [email or web address] URL: [email or web address] Date: 05/09/2024 Prepared by: Alm Kingdom  Exercises - Supine Quadricep Sets  - 1 x daily - 7 x weekly - 2 sets - 10 reps - 5 sec hold - Active Straight Leg Raise with Quad Set (Mirrored)  - 1 x daily - 7 x weekly - 2-3 sets - 10 reps - Clamshell with Resistance  - 1 x daily - 7 x weekly - 2 sets - 15 reps - green band hold - Sidelying Hip  Abduction  - 1 x daily - 7 x weekly - 2-3 sets - 10 reps - Standing Lateral Step-Down Heel Tap  - 1 x daily - 7 x weekly - 4 sets - 5 reps  ASSESSMENT:  CLINICAL IMPRESSION: Pt is a 63 y/o M who presents to PT with reports of chronic bilateral knee pain L>R. Physical findings are consistent with physician impression as pt demonstrates decrease in L knee ROM, LE strength, and general decrease in functional mobility. LEFS score shows he is operating well below PLOF. Pt would benefit from skilled PT services working on improving quad and lateral hip strength in order to decrease pain and improve comfort.   OBJECTIVE IMPAIRMENTS: decreased strength and pain.   ACTIVITY LIMITATIONS: lifting, bending, standing, squatting, and stairs  PARTICIPATION LIMITATIONS: laundry, community activity, occupation, and yard work  PERSONAL FACTORS: Fitness, Past/current experiences, Time since onset of injury/illness/exacerbation, and 1 comorbidity: HTN are also affecting patient's functional outcome.   REHAB POTENTIAL: Good  CLINICAL DECISION MAKING: Stable/uncomplicated  EVALUATION COMPLEXITY: Low   GOALS: Goals reviewed with patient? No  SHORT TERM GOALS: Target date: 05/30/2024   Pt will be independent with their HEP to promote self management of their condition and support progression towards functional goals Baseline: initial HEP given at eval  Goal status: INITIAL  2.  Pt will report a decrease in baseline L knee pain to 5/10 on the 0-10 scale to allow for improved participation in functional activities Baseline: 8/10 Goal status: INITIAL   LONG TERM GOALS: Target date:  06/20/2024   Pt will report a decrease in baseline L knee pain to 2/10 on the 0-10 scale to allow for improved participation in functional activities Baseline: 8/10 Goal status: INITIAL  2.  Pt will improve performance on 30 sec STS from baseline to 11 to reflect improved functional mobility and safety to that of age  related norms Baseline: 8 Goal status: INITIAL  3.  Pt will improve bilateral hip MMT to 5/5 for improved comfort and functional strength Baseline: 4/5 Goal status: INITIAL  4.    Pt will improve L knee flexion AROM to at least 125 degrees for improved comfort and functional mobility Baseline: 410 ft Goal status: INITIAL    PLAN:  PT FREQUENCY: 1-2x/week  PT DURATION: 6 weeks  PLANNED INTERVENTIONS: 97110-Therapeutic exercises, 97530- Therapeutic activity, 97112- Neuromuscular re-education, 97535- Self Care, and 02859- Manual therapy  PLAN FOR NEXT SESSION: assess HEP response, quad and lateral hip strengthening  For all possible CPT codes, reference the Planned Interventions line above.     Check all conditions that are expected to impact treatment: {Conditions expected to impact treatment:Musculoskeletal disorders   If treatment provided at initial evaluation, no treatment charged due to lack of authorization.        Alm JAYSON Kingdom PT  05/09/24 11:47 AM

## 2024-05-24 ENCOUNTER — Ambulatory Visit

## 2024-05-24 DIAGNOSIS — G8929 Other chronic pain: Secondary | ICD-10-CM

## 2024-05-24 DIAGNOSIS — M25562 Pain in left knee: Secondary | ICD-10-CM | POA: Diagnosis not present

## 2024-05-24 DIAGNOSIS — M6281 Muscle weakness (generalized): Secondary | ICD-10-CM

## 2024-05-24 NOTE — Therapy (Signed)
 OUTPATIENT PHYSICAL THERAPY LOWER EXTREMITY EVALUATION   Patient Name: Todd Ryan MRN: [id number] DOB:28-Jun-1961, 63 y.o., male Today's Date: 05/24/2024  END OF SESSION:  PT End of Session - 05/24/24 0846     Visit Number 2    Number of Visits 14    Date for Recertification  06/20/24    Authorization Type [employer]    PT Start Time 0845    PT Stop Time 0925    PT Time Calculation (min) 40 min    Activity Tolerance Patient tolerated treatment well    Behavior During Therapy [facility] for tasks assessed/performed            Past Medical History:  Diagnosis Date   Allergic rhinitis    Arthritis    shoulder, neck, back   Asthma    Chronic back pain    GAD (generalized anxiety disorder)    GERD (gastroesophageal reflux disease)    History of asthma    child   Hyperlipidemia    Hypertension    MDD (major depressive disorder)    OSA on CPAP    07-15-2020  per pt uses approx. 3 times weekly   Peripheral neuropathy    Pre-diabetes    Rotator cuff tear, right    Wears glasses    Past Surgical History:  Procedure Laterality Date   COLONOSCOPY WITH PROPOFOL   05/31/2020   CYSTOSCOPY WITH DIRECT VISION INTERNAL URETHROTOMY N/A 03/13/2024   Procedure: CYSTOSCOPY, WITH DIRECT VISION INTERNAL URETHROTOMY;  Surgeon: Carolee Sherwood JONETTA DOUGLAS, MD;  Location: [facility];  Service: Urology;  Laterality: N/A;   CYSTOURETHROSCOPY, W/ URETHRAL STRICTURE DILATION USING DRUG-COATED BALLOON N/A 03/13/2024   Procedure: CYSTOURETHROSCOPY, WITH URETHRAL STRICTURE DILATION USING DRUG-COATED BALLOON;  Surgeon: Carolee Sherwood JONETTA DOUGLAS, MD;  Location: [facility];  Service: Urology;  Laterality: N/A;  OPTILUME   LUMBAR SPINE SURGERY  11/2014   SHOULDER ARTHROSCOPY WITH ROTATOR CUFF REPAIR AND SUBACROMIAL DECOMPRESSION Right 07/16/2020   Procedure: SHOULDER ARTHROSCOPY WITH ROTATOR CUFF REPAIR AND SUBACROMIAL DECOMPRESSION;  Surgeon: Beverley Evalene JONETTA, MD;  Location: [facility] ;  Service: Orthopedics;   Laterality: Right;   SHOULDER ARTHROSCOPY WITH ROTATOR CUFF REPAIR AND SUBACROMIAL DECOMPRESSION Right 05/05/2022   Procedure: SHOULDER ARTHROSCOPY WITH ROTATOR CUFF REPAIR AND SUBACROMIAL DECOMPRESSION. LOOSE BODYEXCISION;  Surgeon: Beverley Evalene JONETTA, MD;  Location: [facility];  Service: Orthopedics;  Laterality: Right;   Patient Active Problem List   Diagnosis Date Noted   Body mass index (BMI) 32.0-32.9, adult 02/19/2021   Right shoulder pain 02/19/2021   Neck pain 11/21/2020   DDD (degenerative disc disease), lumbosacral 10/20/2019   TIA (transient ischemic attack) 06/02/2018   Hypertension 06/02/2018   Complication of surgical procedure 06/03/2015   Displacement of lumbar intervertebral disc without myelopathy 08/27/2014   Lower back injury 12/07/2013    PCP: Shelda Atlas, MD  REFERRING PROVIDER: Shelda Atlas, MD  REFERRING DIAG:  [phone] (ICD-10-CM) - Unilateral primary osteoarthritis, left knee M25.562,G89.29 (ICD-10-CM) - Chronic patellofemoral pain of left knee M25.561,M25.562,G89.29 (ICD-10-CM) - Chronic pain of both knees  THERAPY DIAG:  Chronic pain of left knee  Chronic pain of right knee  Muscle weakness (generalized)  Rationale for Evaluation and Treatment: Rehabilitation  ONSET DATE: Chronic  SUBJECTIVE:   SUBJECTIVE STATEMENT: Hasn't been doing HEP. Pain today is the same and swelling has come back after being drained.  Pt presents to PT with reports of chronic bilateral knee pain L>R. Previously evaluated by PT earlier this week  for similar symptoms. Had baker's cyst drained from L knee and notes improved symptoms post. Has occasional buckling to L knee, especially on inclines or steps.   PERTINENT HISTORY: HTN   PAIN:  4 (L>R) Are you having pain? Yes: NPRS scale: 2/10 Worst: 8/10 Pain location: bilateral anterior knee L>R Pain description: sharp and shooting Aggravating factors: stairs, standing, walking, sitting,  Relieving  factors: rest, ice  PRECAUTIONS: None  RED FLAGS: None   WEIGHT BEARING RESTRICTIONS: No  FALLS:  Has patient fallen in last 6 months? No  LIVING ENVIRONMENT: Lives with: lives with their spouse Lives in: House/apartment Stairs: 10 steps in home - handrails bilaterally Has following equipment at home: None  OCCUPATION: part time exterminator   PLOF: Independent currently limited in some activities   PATIENT GOALS: get back to running and jumping, possibly playing basketball  NEXT MD VISIT: 06/20/2024  OBJECTIVE:  Note: Objective measures were completed at Evaluation unless otherwise noted.  DIAGNOSTIC FINDINGS: mild medial tibiofemoral joint space narrowing bilaterally   PATIENT SURVEYS:   Extreme difficulty/unable (0), Quite a bit of difficulty (1), Moderate difficulty (2), Little difficulty (3), No difficulty (4) Survey date:  05/09/24  Any of your usual work, housework or school activities 1  2. Usual hobbies, recreational or sporting activities 1  3. Getting into/out of the bath 1  4. Walking between rooms 1  5. Putting on socks/shoes 1  6. Squatting  1  7. Lifting an object, like a bag of groceries from the floor 1  8. Performing light activities around your home 2  9. Performing heavy activities around your home 1  10. Getting into/out of a car 1  11. Walking 2 blocks 1  12. Walking 1 mile 1  13. Going up/down 10 stairs (1 flight) 0  14. Standing for 1 hour 0  15.  sitting for 1 hour 0  16. Running on even ground 0  17. Running on uneven ground 0  18. Making sharp turns while running fast 0  19. Hopping  0  20. Rolling over in bed 1  Score total:  14     COGNITION: Overall cognitive status: Within functional limits for tasks assessed    SENSATION: Not tested  EDEMA L knee swelling noted greater than right  MUSCLE LENGTH: Hamstrings: Right 40 deg ; Left 30 deg  POSTURE: No Significant postural limitations  PALPATION: TTP to distal L quad, L  patellar hypomobility  LOWER EXTREMITY ROM:  Active ROM Right eval Left eval  Hip flexion    Hip extension    Hip abduction    Hip adduction    Hip internal rotation    Hip external rotation    Knee flexion 125 110  Knee extension 0 0  Ankle dorsiflexion    Ankle plantarflexion    Ankle inversion    Ankle eversion     (Blank rows = not tested)  LOWER EXTREMITY MMT:  MMT Right eval Left eval  Hip flexion 4 4  Hip extension    Hip abduction 4 4  Hip adduction    Hip internal rotation    Hip external rotation    Knee flexion 5 5  Knee extension 5 4  Ankle dorsiflexion    Ankle plantarflexion    Ankle inversion    Ankle eversion     (Blank rows = not tested)  LOWER EXTREMITY SPECIAL TESTS:  DNT  FUNCTIONAL TESTS:  30 sec STS: 8 reps no UE  GAIT: Distance walked: 48ft Assistive device utilized: None Level of assistance: Complete Independence Comments: none                                                                                                                               TREATMENT DATE:  Treatment 05/24/24: *Patient required extra time for exercises due to additional reeducation on pain science, optimal loading, prognosis, and relevant tissues/anatomy*  Neuromuscular reeducation: HEP reassessment and update Supine quad set x8x3s Supine SLR 2x6x3s  Supine GTB clamshell 2x8x3s Seated GTB HS curl 2x8x3s BL Seated GTB LAQ x8x3s BL    [facility] Adult PT Treatment:                                                DATE: 01/27/2024 Therapeutic Exercise: Supine QS x 5 - 5 hold Supine SLR x 10 S/L clamshell x 10 GTB S/L hip abd x 10  Eccentric heel tap x 5 - 4in step  PATIENT EDUCATION:  Education details: eval findings, LEFS, HEP, POC  [place] educated: Patient Education method: Explanation, Demonstration, Verbal cues, and Handouts Education comprehension: verbalized understanding and returned demonstration  HOME EXERCISE PROGRAM: Access Code:  [email or web address] URL: [email or web address] Date: 05/09/2024 Prepared by: Alm Kingdom  Exercises - Supine Quadricep Sets  - 1 x daily - 7 x weekly - 2 sets - 10 reps - 5 sec hold - Active Straight Leg Raise with Quad Set (Mirrored)  - 1 x daily - 7 x weekly - 2-3 sets - 10 reps - Clamshell with Resistance  - 1 x daily - 7 x weekly - 2 sets - 15 reps - green band hold - Sidelying Hip Abduction  - 1 x daily - 7 x weekly - 2-3 sets - 10 reps - Standing Lateral Step-Down Heel Tap  - 1 x daily - 7 x weekly - 4 sets - 5 reps  Seated GTB clamshell Seated GTB LAQ   ASSESSMENT:  CLINICAL IMPRESSION: Patient tolerated treatment with no increases in pain with progressions in BL knee and hip loading. Current deficits include: excessive pain, functional activity tolerance, and strength. As a result, patient would continue to benefit from skilled PT to address said deficits via plan below.   Pt is a 63 y/o M who presents to PT with reports of chronic bilateral knee pain L>R. Physical findings are consistent with physician impression as pt demonstrates decrease in L knee ROM, LE strength, and general decrease in functional mobility. LEFS score shows he is operating well below PLOF. Pt would benefit from skilled PT services working on improving quad and lateral hip strength in order to decrease pain and improve comfort.   OBJECTIVE IMPAIRMENTS: decreased strength and pain.   ACTIVITY LIMITATIONS: lifting, bending, standing, squatting, and stairs  PARTICIPATION LIMITATIONS: laundry, community activity, occupation, and yard  work  PERSONAL FACTORS: Fitness, Past/current experiences, Time since onset of injury/illness/exacerbation, and 1 comorbidity: HTN are also affecting patient's functional outcome.   REHAB POTENTIAL: Good  CLINICAL DECISION MAKING: Stable/uncomplicated  EVALUATION COMPLEXITY: Low   GOALS: Goals reviewed with patient? No  SHORT TERM GOALS: Target date: 05/30/2024   Pt  will be independent with their HEP to promote self management of their condition and support progression towards functional goals Baseline: initial HEP given at eval  Goal status: INITIAL  2.  Pt will report a decrease in baseline L knee pain to 5/10 on the 0-10 scale to allow for improved participation in functional activities Baseline: 8/10 Goal status: INITIAL   LONG TERM GOALS: Target date: 06/20/2024   Pt will report a decrease in baseline L knee pain to 2/10 on the 0-10 scale to allow for improved participation in functional activities Baseline: 8/10 Goal status: INITIAL  2.  Pt will improve performance on 30 sec STS from baseline to 11 to reflect improved functional mobility and safety to that of age related norms Baseline: 8 Goal status: INITIAL  3.  Pt will improve bilateral hip MMT to 5/5 for improved comfort and functional strength Baseline: 4/5 Goal status: INITIAL  4.    Pt will improve L knee flexion AROM to at least 125 degrees for improved comfort and functional mobility Baseline: 410 ft Goal status: INITIAL    PLAN:  PT FREQUENCY: 1-2x/week  PT DURATION: 6 weeks  PLANNED INTERVENTIONS: 97110-Therapeutic exercises, 97530- Therapeutic activity, 97112- Neuromuscular re-education, 97535- Self Care, and 02859- Manual therapy  PLAN FOR NEXT SESSION: assess HEP response, HS, quad and lateral hip strengthening  For all possible CPT codes, reference the Planned Interventions line above.     Check all conditions that are expected to impact treatment: {Conditions expected to impact treatment:Musculoskeletal disorders   If treatment provided at initial evaluation, no treatment charged due to lack of authorization.        Washington Greener Feather Berrie PT  05/24/24 9:29 AM

## 2024-05-29 ENCOUNTER — Ambulatory Visit

## 2024-05-29 ENCOUNTER — Telehealth: Payer: Self-pay

## 2024-05-29 DIAGNOSIS — M6281 Muscle weakness (generalized): Secondary | ICD-10-CM | POA: Insufficient documentation

## 2024-05-29 DIAGNOSIS — G8929 Other chronic pain: Secondary | ICD-10-CM | POA: Insufficient documentation

## 2024-05-29 DIAGNOSIS — M25562 Pain in left knee: Secondary | ICD-10-CM | POA: Insufficient documentation

## 2024-05-29 DIAGNOSIS — R2689 Other abnormalities of gait and mobility: Secondary | ICD-10-CM | POA: Insufficient documentation

## 2024-05-29 DIAGNOSIS — M25561 Pain in right knee: Secondary | ICD-10-CM | POA: Insufficient documentation

## 2024-05-29 NOTE — Therapy (Incomplete)
 OUTPATIENT PHYSICAL THERAPY TREATMENT   Patient Name: Todd Ryan MRN: [id number] DOB:[birth year], 63 y.o., male Today's Date: 05/29/2024  END OF SESSION:      Past Medical History:  Diagnosis Date   Allergic rhinitis    Arthritis    shoulder, neck, back   Asthma    Chronic back pain    GAD (generalized anxiety disorder)    GERD (gastroesophageal reflux disease)    History of asthma    child   Hyperlipidemia    Hypertension    MDD (major depressive disorder)    OSA on CPAP    07-15-2020  per pt uses approx. 3 times weekly   Peripheral neuropathy    Pre-diabetes    Rotator cuff tear, right    Wears glasses    Past Surgical History:  Procedure Laterality Date   COLONOSCOPY WITH PROPOFOL   05/31/2020   CYSTOSCOPY WITH DIRECT VISION INTERNAL URETHROTOMY N/A 03/13/2024   Procedure: CYSTOSCOPY, WITH DIRECT VISION INTERNAL URETHROTOMY;  Surgeon: Carolee Sherwood JONETTA DOUGLAS, MD;  Location: [facility];  Service: Urology;  Laterality: N/A;   CYSTOURETHROSCOPY, W/ URETHRAL STRICTURE DILATION USING DRUG-COATED BALLOON N/A 03/13/2024   Procedure: CYSTOURETHROSCOPY, WITH URETHRAL STRICTURE DILATION USING DRUG-COATED BALLOON;  Surgeon: Carolee Sherwood JONETTA DOUGLAS, MD;  Location: [facility];  Service: Urology;  Laterality: N/A;  OPTILUME   LUMBAR SPINE SURGERY  11/2014   SHOULDER ARTHROSCOPY WITH ROTATOR CUFF REPAIR AND SUBACROMIAL DECOMPRESSION Right 07/16/2020   Procedure: SHOULDER ARTHROSCOPY WITH ROTATOR CUFF REPAIR AND SUBACROMIAL DECOMPRESSION;  Surgeon: Beverley Evalene JONETTA, MD;  Location: [facility];  Service: Orthopedics;  Laterality: Right;   SHOULDER ARTHROSCOPY WITH ROTATOR CUFF REPAIR AND SUBACROMIAL DECOMPRESSION Right 05/05/2022   Procedure: SHOULDER ARTHROSCOPY WITH ROTATOR CUFF REPAIR AND SUBACROMIAL DECOMPRESSION. LOOSE BODYEXCISION;  Surgeon: Beverley Evalene JONETTA, MD;  Location: [facility];  Service: Orthopedics;  Laterality: Right;   Patient Active Problem List    Diagnosis Date Noted   Body mass index (BMI) 32.0-32.9, adult 02/19/2021   Right shoulder pain 02/19/2021   Neck pain 11/21/2020   DDD (degenerative disc disease), lumbosacral 10/20/2019   TIA (transient ischemic attack) 06/02/2018   Hypertension 06/02/2018   Complication of surgical procedure 06/03/2015   Displacement of lumbar intervertebral disc without myelopathy 08/27/2014   Lower back injury 12/07/2013    PCP: Shelda Atlas, MD  REFERRING PROVIDER: Shelda Atlas, MD  REFERRING DIAG:  M17.12 (ICD-10-CM) - Unilateral primary osteoarthritis, left knee M25.562,G89.29 (ICD-10-CM) - Chronic patellofemoral pain of left knee M25.561,M25.562,G89.29 (ICD-10-CM) - Chronic pain of both knees  THERAPY DIAG:  No diagnosis found.  Rationale for Evaluation and Treatment: Rehabilitation  ONSET DATE: Chronic  SUBJECTIVE:   SUBJECTIVE STATEMENT: ***  Pt presents to PT with reports of chronic bilateral knee pain L>R. Previously evaluated by PT earlier this week for similar symptoms. Had baker's cyst drained from L knee and notes improved symptoms post. Has occasional buckling to L knee, especially on inclines or steps.   PERTINENT HISTORY: HTN   PAIN:  4 (L>R) Are you having pain? Yes: NPRS scale: 2/10 Worst: 8/10 Pain location: bilateral anterior knee L>R Pain description: sharp and shooting Aggravating factors: stairs, standing, walking, sitting,  Relieving factors: rest, ice  PRECAUTIONS: None  RED FLAGS: None   WEIGHT BEARING RESTRICTIONS: No  FALLS:  Has patient fallen in last 6 months? No  LIVING ENVIRONMENT: Lives with: lives with their spouse Lives in: House/apartment Stairs: 10 steps in home - handrails bilaterally Has following  equipment at home: None  OCCUPATION: part time exterminator   PLOF: Independent currently limited in some activities   PATIENT GOALS: get back to running and jumping, possibly playing basketball  NEXT MD VISIT:  06/20/2024  OBJECTIVE:  Note: Objective measures were completed at Evaluation unless otherwise noted.  DIAGNOSTIC FINDINGS: mild medial tibiofemoral joint space narrowing bilaterally   PATIENT SURVEYS:   Extreme difficulty/unable (0), Quite a bit of difficulty (1), Moderate difficulty (2), Little difficulty (3), No difficulty (4) Survey date:  05/09/24  Any of your usual work, housework or school activities 1  2. Usual hobbies, recreational or sporting activities 1  3. Getting into/out of the bath 1  4. Walking between rooms 1  5. Putting on socks/shoes 1  6. Squatting  1  7. Lifting an object, like a bag of groceries from the floor 1  8. Performing light activities around your home 2  9. Performing heavy activities around your home 1  10. Getting into/out of a car 1  11. Walking 2 blocks 1  12. Walking 1 mile 1  13. Going up/down 10 stairs (1 flight) 0  14. Standing for 1 hour 0  15.  sitting for 1 hour 0  16. Running on even ground 0  17. Running on uneven ground 0  18. Making sharp turns while running fast 0  19. Hopping  0  20. Rolling over in bed 1  Score total:  14     COGNITION: Overall cognitive status: Within functional limits for tasks assessed    SENSATION: Not tested  EDEMA L knee swelling noted greater than right  MUSCLE LENGTH: Hamstrings: Right 40 deg ; Left 30 deg  POSTURE: No Significant postural limitations  PALPATION: TTP to distal L quad, L patellar hypomobility  LOWER EXTREMITY ROM:  Active ROM Right eval Left eval  Hip flexion    Hip extension    Hip abduction    Hip adduction    Hip internal rotation    Hip external rotation    Knee flexion 125 110  Knee extension 0 0  Ankle dorsiflexion    Ankle plantarflexion    Ankle inversion    Ankle eversion     (Blank rows = not tested)  LOWER EXTREMITY MMT:  MMT Right eval Left eval  Hip flexion 4 4  Hip extension    Hip abduction 4 4  Hip adduction    Hip internal rotation     Hip external rotation    Knee flexion 5 5  Knee extension 5 4  Ankle dorsiflexion    Ankle plantarflexion    Ankle inversion    Ankle eversion     (Blank rows = not tested)  LOWER EXTREMITY SPECIAL TESTS:  DNT  FUNCTIONAL TESTS:  30 sec STS: 8 reps no UE   GAIT: Distance walked: 425ft Assistive device utilized: None Level of assistance: Complete Independence Comments: none  TREATMENT DATE:  Treatment:                                                DATE: 05/29/2024 Therapeutic Exercise: Supine QS x 5 - 5 hold Supine SLR x 10 S/L clamshell x 10 GTB S/L hip abd x 10  Eccentric heel tap x 5 - 4in step  Treatment 05/24/24: *Patient required extra time for exercises due to additional reeducation on pain science, optimal loading, prognosis, and relevant tissues/anatomy*  Neuromuscular reeducation: HEP reassessment and update Supine quad set x8x3s Supine SLR 2x6x3s  Supine GTB clamshell 2x8x3s Seated GTB HS curl 2x8x3s BL Seated GTB LAQ x8x3s BL    [facility] Adult PT Treatment:                                                DATE: 01/27/2024 Therapeutic Exercise: Supine QS x 5 - 5 hold Supine SLR x 10 S/L clamshell x 10 GTB S/L hip abd x 10  Eccentric heel tap x 5 - 4in step  PATIENT EDUCATION:  Education details: eval findings, LEFS, HEP, POC  [place] educated: Patient Education method: Explanation, Demonstration, Verbal cues, and Handouts Education comprehension: verbalized understanding and returned demonstration  HOME EXERCISE PROGRAM: Access Code: [email or web address] URL: [email or web address] Date: 05/09/2024 Prepared by: Alm Kingdom  Exercises - Supine Quadricep Sets  - 1 x daily - 7 x weekly - 2 sets - 10 reps - 5 sec hold - Active Straight Leg Raise with Quad Set (Mirrored)  - 1 x daily - 7 x weekly - 2-3 sets - 10 reps -  Clamshell with Resistance  - 1 x daily - 7 x weekly - 2 sets - 15 reps - green band hold - Sidelying Hip Abduction  - 1 x daily - 7 x weekly - 2-3 sets - 10 reps - Standing Lateral Step-Down Heel Tap  - 1 x daily - 7 x weekly - 4 sets - 5 reps  Seated GTB clamshell Seated GTB LAQ   ASSESSMENT:  CLINICAL IMPRESSION: ***  Pt is a 63 y/o M who presents to PT with reports of chronic bilateral knee pain L>R. Physical findings are consistent with physician impression as pt demonstrates decrease in L knee ROM, LE strength, and general decrease in functional mobility. LEFS score shows he is operating well below PLOF. Pt would benefit from skilled PT services working on improving quad and lateral hip strength in order to decrease pain and improve comfort.   OBJECTIVE IMPAIRMENTS: decreased strength and pain.   ACTIVITY LIMITATIONS: lifting, bending, standing, squatting, and stairs  PARTICIPATION LIMITATIONS: laundry, community activity, occupation, and yard work  PERSONAL FACTORS: Fitness, Past/current experiences, Time since onset of injury/illness/exacerbation, and 1 comorbidity: HTN are also affecting patient's functional outcome.   REHAB POTENTIAL: Good  CLINICAL DECISION MAKING: Stable/uncomplicated  EVALUATION COMPLEXITY: Low   GOALS: Goals reviewed with patient? No  SHORT TERM GOALS: Target date: 05/30/2024   Pt will be independent with their HEP to promote self management of their condition and support progression towards functional goals Baseline: initial HEP given at eval  Goal status: INITIAL  2.  Pt will report a decrease in baseline L knee pain to 5/10 on the 0-10 scale  to allow for improved participation in functional activities Baseline: 8/10 Goal status: INITIAL   LONG TERM GOALS: Target date: 06/20/2024   Pt will report a decrease in baseline L knee pain to 2/10 on the 0-10 scale to allow for improved participation in functional activities Baseline: 8/10 Goal  status: INITIAL  2.  Pt will improve performance on 30 sec STS from baseline to 11 to reflect improved functional mobility and safety to that of age related norms Baseline: 8 Goal status: INITIAL  3.  Pt will improve bilateral hip MMT to 5/5 for improved comfort and functional strength Baseline: 4/5 Goal status: INITIAL  4.    Pt will improve L knee flexion AROM to at least 125 degrees for improved comfort and functional mobility Baseline: 410 ft Goal status: INITIAL    PLAN:  PT FREQUENCY: 1-2x/week  PT DURATION: 6 weeks  PLANNED INTERVENTIONS: 97110-Therapeutic exercises, 97530- Therapeutic activity, 97112- Neuromuscular re-education, 97535- Self Care, and 02859- Manual therapy  PLAN FOR NEXT SESSION: assess HEP response, HS, quad and lateral hip strengthening  For all possible CPT codes, reference the Planned Interventions line above.     Check all conditions that are expected to impact treatment: {Conditions expected to impact treatment:Musculoskeletal disorders   If treatment provided at initial evaluation, no treatment charged due to lack of authorization.        Alm JAYSON Kingdom PT  05/29/24 7:55 AM

## 2024-05-29 NOTE — Telephone Encounter (Signed)
 PT called and spoke with patient who forgot about the appointment this morning. Confirmed next visit.  Todd Ryan   05/29/24 9:05 AM

## 2024-06-06 ENCOUNTER — Ambulatory Visit

## 2024-06-06 DIAGNOSIS — M6281 Muscle weakness (generalized): Secondary | ICD-10-CM | POA: Diagnosis present

## 2024-06-06 DIAGNOSIS — G8929 Other chronic pain: Secondary | ICD-10-CM | POA: Diagnosis present

## 2024-06-06 DIAGNOSIS — R2689 Other abnormalities of gait and mobility: Secondary | ICD-10-CM | POA: Diagnosis present

## 2024-06-06 DIAGNOSIS — M25562 Pain in left knee: Secondary | ICD-10-CM | POA: Diagnosis present

## 2024-06-06 DIAGNOSIS — M25561 Pain in right knee: Secondary | ICD-10-CM | POA: Diagnosis present

## 2024-06-06 NOTE — Therapy (Signed)
 OUTPATIENT PHYSICAL THERAPY LOWER EXTREMITY EVALUATION   Patient Name: Todd Ryan MRN: [id number] DOB:08-Jan-1961, 63 y.o., male Today's Date: 06/06/2024  END OF SESSION:  PT End of Session - 06/06/24 1014     Visit Number 3    Number of Visits 14    Date for Recertification  06/20/24    Authorization Type [employer]    PT Start Time 1015    PT Stop Time 1055    PT Time Calculation (min) 40 min    Activity Tolerance Patient tolerated treatment well    Behavior During Therapy WFL for tasks assessed/performed             Past Medical History:  Diagnosis Date   Allergic rhinitis    Arthritis    shoulder, neck, back   Asthma    Chronic back pain    GAD (generalized anxiety disorder)    GERD (gastroesophageal reflux disease)    History of asthma    child   Hyperlipidemia    Hypertension    MDD (major depressive disorder)    OSA on CPAP    07-15-2020  per pt uses approx. 3 times weekly   Peripheral neuropathy    Pre-diabetes    Rotator cuff tear, right    Wears glasses    Past Surgical History:  Procedure Laterality Date   COLONOSCOPY WITH PROPOFOL   05/31/2020   CYSTOSCOPY WITH DIRECT VISION INTERNAL URETHROTOMY N/A 03/13/2024   Procedure: CYSTOSCOPY, WITH DIRECT VISION INTERNAL URETHROTOMY;  Surgeon: Carolee Sherwood JONETTA DOUGLAS, MD;  Location: [facility];  Service: Urology;  Laterality: N/A;   CYSTOURETHROSCOPY, W/ URETHRAL STRICTURE DILATION USING DRUG-COATED BALLOON N/A 03/13/2024   Procedure: CYSTOURETHROSCOPY, WITH URETHRAL STRICTURE DILATION USING DRUG-COATED BALLOON;  Surgeon: Carolee Sherwood JONETTA DOUGLAS, MD;  Location: [facility];  Service: Urology;  Laterality: N/A;  OPTILUME   LUMBAR SPINE SURGERY  11/2014   SHOULDER ARTHROSCOPY WITH ROTATOR CUFF REPAIR AND SUBACROMIAL DECOMPRESSION Right 07/16/2020   Procedure: SHOULDER ARTHROSCOPY WITH ROTATOR CUFF REPAIR AND SUBACROMIAL DECOMPRESSION;  Surgeon: Beverley Evalene JONETTA, MD;  Location: [facility];  Service: Orthopedics;   Laterality: Right;   SHOULDER ARTHROSCOPY WITH ROTATOR CUFF REPAIR AND SUBACROMIAL DECOMPRESSION Right 05/05/2022   Procedure: SHOULDER ARTHROSCOPY WITH ROTATOR CUFF REPAIR AND SUBACROMIAL DECOMPRESSION. LOOSE BODYEXCISION;  Surgeon: Beverley Evalene JONETTA, MD;  Location: [facility];  Service: Orthopedics;  Laterality: Right;   Patient Active Problem List   Diagnosis Date Noted   Body mass index (BMI) 32.0-32.9, adult 02/19/2021   Right shoulder pain 02/19/2021   Neck pain 11/21/2020   DDD (degenerative disc disease), lumbosacral 10/20/2019   TIA (transient ischemic attack) 06/02/2018   Hypertension 06/02/2018   Complication of surgical procedure 06/03/2015   Displacement of lumbar intervertebral disc without myelopathy 08/27/2014   Lower back injury 12/07/2013    PCP: Shelda Atlas, MD  REFERRING PROVIDER: Burnetta Brunet, DO  REFERRING DIAG:  [id number] (ICD-10-CM) - Unilateral primary osteoarthritis, left knee M25.562,G89.29 (ICD-10-CM) - Chronic patellofemoral pain of left knee M25.561,M25.562,G89.29 (ICD-10-CM) - Chronic pain of both knees  THERAPY DIAG:  Chronic pain of right knee  Chronic pain of left knee  Muscle weakness (generalized)  Rationale for Evaluation and Treatment: Rehabilitation  ONSET DATE: Chronic  SUBJECTIVE:   SUBJECTIVE STATEMENT: Been doing HEP consistently. 2 out of 10 currently.   Pt presents to PT with reports of chronic bilateral knee pain L>R. Previously evaluated by PT earlier this week for similar symptoms. Had baker's cyst  drained from L knee and notes improved symptoms post. Has occasional buckling to L knee, especially on inclines or steps.   PERTINENT HISTORY: HTN   PAIN:  4 (L>R) Are you having pain? Yes: NPRS scale: 2/10 Worst: 8/10 Pain location: bilateral anterior knee L>R Pain description: sharp and shooting Aggravating factors: stairs, standing, walking, sitting,  Relieving factors: rest, ice  PRECAUTIONS:  None  RED FLAGS: None   WEIGHT BEARING RESTRICTIONS: No  FALLS:  Has patient fallen in last 6 months? No  LIVING ENVIRONMENT: Lives with: lives with their spouse Lives in: House/apartment Stairs: 10 steps in home - handrails bilaterally Has following equipment at home: None  OCCUPATION: part time exterminator   PLOF: Independent currently limited in some activities   PATIENT GOALS: get back to running and jumping, possibly playing basketball  NEXT MD VISIT: 06/20/2024  OBJECTIVE:  Note: Objective measures were completed at Evaluation unless otherwise noted.  DIAGNOSTIC FINDINGS: mild medial tibiofemoral joint space narrowing bilaterally   PATIENT SURVEYS:   Extreme difficulty/unable (0), Quite a bit of difficulty (1), Moderate difficulty (2), Little difficulty (3), No difficulty (4) Survey date:  05/09/24  Any of your usual work, housework or school activities 1  2. Usual hobbies, recreational or sporting activities 1  3. Getting into/out of the bath 1  4. Walking between rooms 1  5. Putting on socks/shoes 1  6. Squatting  1  7. Lifting an object, like a bag of groceries from the floor 1  8. Performing light activities around your home 2  9. Performing heavy activities around your home 1  10. Getting into/out of a car 1  11. Walking 2 blocks 1  12. Walking 1 mile 1  13. Going up/down 10 stairs (1 flight) 0  14. Standing for 1 hour 0  15.  sitting for 1 hour 0  16. Running on even ground 0  17. Running on uneven ground 0  18. Making sharp turns while running fast 0  19. Hopping  0  20. Rolling over in bed 1  Score total:  14     COGNITION: Overall cognitive status: Within functional limits for tasks assessed    SENSATION: Not tested  EDEMA L knee swelling noted greater than right  MUSCLE LENGTH: Hamstrings: Right 40 deg ; Left 30 deg  POSTURE: No Significant postural limitations  PALPATION: TTP to distal L quad, L patellar hypomobility  LOWER  EXTREMITY ROM:  Active ROM Right eval Left eval  Hip flexion    Hip extension    Hip abduction    Hip adduction    Hip internal rotation    Hip external rotation    Knee flexion 125 110  Knee extension 0 0  Ankle dorsiflexion    Ankle plantarflexion    Ankle inversion    Ankle eversion     (Blank rows = not tested)  LOWER EXTREMITY MMT:  MMT Right eval Left eval  Hip flexion 4 4  Hip extension    Hip abduction 4 4  Hip adduction    Hip internal rotation    Hip external rotation    Knee flexion 5 5  Knee extension 5 4  Ankle dorsiflexion    Ankle plantarflexion    Ankle inversion    Ankle eversion     (Blank rows = not tested)  LOWER EXTREMITY SPECIAL TESTS:  DNT  FUNCTIONAL TESTS:  30 sec STS: 8 reps no UE   GAIT: Distance walked: 422ft Assistive device  utilized: None Level of assistance: Complete Independence Comments: none                                                                                                                               TREATMENT DATE:  Treatment 06/06/24: *Patient required extra time for exercises due to additional reeducation on pain science, optimal loading, prognosis, and relevant tissues/anatomy*  Neuromuscular reeducation: HEP reassessment and update GTB clamshell x8x3s, Blue TB x6x3s, Black x6x3s Seated LAQ BTB 2x6x3s  Seated HS curl BTB x6x3s  Therapeutic Activity: 75# deadlift 2x8 STS lowest table setting x8        Treatment 05/24/24: *Patient required extra time for exercises due to additional reeducation on pain science, optimal loading, prognosis, and relevant tissues/anatomy*  Neuromuscular reeducation: HEP reassessment and update Supine quad set x8x3s Supine SLR 2x6x3s  Supine GTB clamshell 2x8x3s Seated GTB HS curl 2x8x3s BL Seated GTB LAQ x8x3s BL    [facility] Adult PT Treatment:                                                DATE: 01/27/2024 Therapeutic Exercise: Supine QS x 5 - 5  hold Supine SLR x 10 S/L clamshell x 10 GTB S/L hip abd x 10  Eccentric heel tap x 5 - 4in step  PATIENT EDUCATION:  Education details: eval findings, LEFS, HEP, POC  Jacober educated: Patient Education method: Explanation, Demonstration, Verbal cues, and Handouts Education comprehension: verbalized understanding and returned demonstration  HOME EXERCISE PROGRAM: Access Code: [email or web address] URL: [email or web address] Date: 05/09/2024 Prepared by: Alm Kingdom  Exercises - Supine Quadricep Sets  - 1 x daily - 7 x weekly - 2 sets - 10 reps - 5 sec hold - Active Straight Leg Raise with Quad Set (Mirrored)  - 1 x daily - 7 x weekly - 2-3 sets - 10 reps - Clamshell with Resistance  - 1 x daily - 7 x weekly - 2 sets - 15 reps - green band hold - Sidelying Hip Abduction  - 1 x daily - 7 x weekly - 2-3 sets - 10 reps  Doing these 3  - Standing Lateral Step-Down Heel Tap  - 1 x daily - 7 x weekly - 4 sets - 5 reps Seated Black TB clamshell x6x3s Seated BTB LAQ x6x3s   ASSESSMENT:  CLINICAL IMPRESSION: Patient tolerated treatment with no increases in pain with progressions in BL knee and hip loading. Current deficits include: excessive pain, functional activity tolerance, and strength. As a result, patient would continue to benefit from skilled PT to address said deficits via plan below.   Pt is a 63 y/o M who presents to PT with reports of chronic bilateral knee pain L>R. Physical findings are consistent with physician impression as pt demonstrates decrease in L knee ROM, LE strength,  and general decrease in functional mobility. LEFS score shows he is operating well below PLOF. Pt would benefit from skilled PT services working on improving quad and lateral hip strength in order to decrease pain and improve comfort.   OBJECTIVE IMPAIRMENTS: decreased strength and pain.   ACTIVITY LIMITATIONS: lifting, bending, standing, squatting, and stairs  PARTICIPATION LIMITATIONS: laundry,  community activity, occupation, and yard work  PERSONAL FACTORS: Fitness, Past/current experiences, Time since onset of injury/illness/exacerbation, and 1 comorbidity: HTN are also affecting patient's functional outcome.   REHAB POTENTIAL: Good  CLINICAL DECISION MAKING: Stable/uncomplicated  EVALUATION COMPLEXITY: Low   GOALS: Goals reviewed with patient? No  SHORT TERM GOALS: Target date: 05/30/2024   Pt will be independent with their HEP to promote self management of their condition and support progression towards functional goals Baseline: initial HEP given at eval  Goal status: INITIAL  2.  Pt will report a decrease in baseline L knee pain to 5/10 on the 0-10 scale to allow for improved participation in functional activities Baseline: 8/10 Goal status: INITIAL   LONG TERM GOALS: Target date: 06/20/2024   Pt will report a decrease in baseline L knee pain to 2/10 on the 0-10 scale to allow for improved participation in functional activities Baseline: 8/10 Goal status: INITIAL  2.  Pt will improve performance on 30 sec STS from baseline to 11 to reflect improved functional mobility and safety to that of age related norms Baseline: 8 Goal status: INITIAL  3.  Pt will improve bilateral hip MMT to 5/5 for improved comfort and functional strength Baseline: 4/5 Goal status: INITIAL  4.    Pt will improve L knee flexion AROM to at least 125 degrees for improved comfort and functional mobility Baseline: 410 ft Goal status: INITIAL    PLAN:  PT FREQUENCY: 1-2x/week  PT DURATION: 6 weeks  PLANNED INTERVENTIONS: 97110-Therapeutic exercises, 97530- Therapeutic activity, 97112- Neuromuscular re-education, 97535- Self Care, and 02859- Manual therapy  PLAN FOR NEXT SESSION: assess HEP response, HS, quad and lateral hip strengthening (functional and isolated)  For all possible CPT codes, reference the Planned Interventions line above.     Check all conditions that are  expected to impact treatment: {Conditions expected to impact treatment:Musculoskeletal disorders   If treatment provided at initial evaluation, no treatment charged due to lack of authorization.        [place] Greener Dai Apel PT  06/06/24 10:57 AM

## 2024-06-13 ENCOUNTER — Ambulatory Visit

## 2024-06-13 NOTE — Therapy (Signed)
 OUTPATIENT PHYSICAL THERAPY LOWER EXTREMITY Treatment   Patient Name: Todd Ryan MRN: [id number] DOB:Jun 22, 1961, 63 y.o., male Today's Date: 06/14/2024  END OF SESSION:  PT End of Session - 06/14/24 0809     Visit Number 4    Number of Visits 14    Date for Recertification  06/20/24    Authorization Type [employer]    PT Start Time 0802    PT Stop Time 0845    PT Time Calculation (min) 43 min    Activity Tolerance Patient tolerated treatment well    Behavior During Therapy WFL for tasks assessed/performed              Past Medical History:  Diagnosis Date   Allergic rhinitis    Arthritis    shoulder, neck, back   Asthma    Chronic back pain    GAD (generalized anxiety disorder)    GERD (gastroesophageal reflux disease)    History of asthma    child   Hyperlipidemia    Hypertension    MDD (major depressive disorder)    OSA on CPAP    07-15-2020  per pt uses approx. 3 times weekly   Peripheral neuropathy    Pre-diabetes    Rotator cuff tear, right    Wears glasses    Past Surgical History:  Procedure Laterality Date   COLONOSCOPY WITH PROPOFOL   05/31/2020   CYSTOSCOPY WITH DIRECT VISION INTERNAL URETHROTOMY N/A 03/13/2024   Procedure: CYSTOSCOPY, WITH DIRECT VISION INTERNAL URETHROTOMY;  Surgeon: Carolee Sherwood JONETTA DOUGLAS, MD;  Location: [facility];  Service: Urology;  Laterality: N/A;   CYSTOURETHROSCOPY, W/ URETHRAL STRICTURE DILATION USING DRUG-COATED BALLOON N/A 03/13/2024   Procedure: CYSTOURETHROSCOPY, WITH URETHRAL STRICTURE DILATION USING DRUG-COATED BALLOON;  Surgeon: Carolee Sherwood JONETTA DOUGLAS, MD;  Location: [facility];  Service: Urology;  Laterality: N/A;  OPTILUME   LUMBAR SPINE SURGERY  11/2014   SHOULDER ARTHROSCOPY WITH ROTATOR CUFF REPAIR AND SUBACROMIAL DECOMPRESSION Right 07/16/2020   Procedure: SHOULDER ARTHROSCOPY WITH ROTATOR CUFF REPAIR AND SUBACROMIAL DECOMPRESSION;  Surgeon: Beverley Evalene JONETTA, MD;  Location: [facility];  Service: Orthopedics;   Laterality: Right;   SHOULDER ARTHROSCOPY WITH ROTATOR CUFF REPAIR AND SUBACROMIAL DECOMPRESSION Right 05/05/2022   Procedure: SHOULDER ARTHROSCOPY WITH ROTATOR CUFF REPAIR AND SUBACROMIAL DECOMPRESSION. LOOSE BODYEXCISION;  Surgeon: Beverley Evalene JONETTA, MD;  Location: [facility];  Service: Orthopedics;  Laterality: Right;   Patient Active Problem List   Diagnosis Date Noted   Body mass index (BMI) 32.0-32.9, adult 02/19/2021   Right shoulder pain 02/19/2021   Neck pain 11/21/2020   DDD (degenerative disc disease), lumbosacral 10/20/2019   TIA (transient ischemic attack) 06/02/2018   Hypertension 06/02/2018   Complication of surgical procedure 06/03/2015   Displacement of lumbar intervertebral disc without myelopathy 08/27/2014   Lower back injury 12/07/2013    PCP: Shelda Atlas, MD  REFERRING PROVIDER: Burnetta Brunet, DO  REFERRING DIAG:  [phone] (ICD-10-CM) - Unilateral primary osteoarthritis, left knee M25.562,G89.29 (ICD-10-CM) - Chronic patellofemoral pain of left knee M25.561,M25.562,G89.29 (ICD-10-CM) - Chronic pain of both knees  THERAPY DIAG:  Chronic pain of right knee  Chronic pain of left knee  Muscle weakness (generalized)  Other abnormalities of gait and mobility  Rationale for Evaluation and Treatment: Rehabilitation  ONSET DATE: Chronic  SUBJECTIVE:   SUBJECTIVE STATEMENT: Pt reports his knees have been bothering me more lately. 3-4 out of 10 currently. Occasional, brief sharp pain. Overall better, but improvement has plateaued.  Pt presents  to PT with reports of chronic bilateral knee pain L>R. Previously evaluated by PT earlier this week for similar symptoms. Had baker's cyst drained from L knee and notes improved symptoms post. Has occasional buckling to L knee, especially on inclines or steps.   PERTINENT HISTORY: HTN   PAIN:  4 (L>R) Are you having pain? Yes: NPRS scale: 2/10 Worst: 8/10 Pain location: bilateral anterior knee  L>R Pain description: sharp and shooting Aggravating factors: stairs, standing, walking, sitting,  Relieving factors: rest, ice  PRECAUTIONS: None  RED FLAGS: None   WEIGHT BEARING RESTRICTIONS: No  FALLS:  Has patient fallen in last 6 months? No  LIVING ENVIRONMENT: Lives with: lives with their spouse Lives in: House/apartment Stairs: 10 steps in home - handrails bilaterally Has following equipment at home: None  OCCUPATION: part time exterminator   PLOF: Independent currently limited in some activities   PATIENT GOALS: get back to running and jumping, possibly playing basketball  NEXT MD VISIT: 06/20/2024  OBJECTIVE:  Note: Objective measures were completed at Evaluation unless otherwise noted.  DIAGNOSTIC FINDINGS: mild medial tibiofemoral joint space narrowing bilaterally   PATIENT SURVEYS:   Extreme difficulty/unable (0), Quite a bit of difficulty (1), Moderate difficulty (2), Little difficulty (3), No difficulty (4) Survey date:  05/09/24  Any of your usual work, housework or school activities 1  2. Usual hobbies, recreational or sporting activities 1  3. Getting into/out of the bath 1  4. Walking between rooms 1  5. Putting on socks/shoes 1  6. Squatting  1  7. Lifting an object, like a bag of groceries from the floor 1  8. Performing light activities around your home 2  9. Performing heavy activities around your home 1  10. Getting into/out of a car 1  11. Walking 2 blocks 1  12. Walking 1 mile 1  13. Going up/down 10 stairs (1 flight) 0  14. Standing for 1 hour 0  15.  sitting for 1 hour 0  16. Running on even ground 0  17. Running on uneven ground 0  18. Making sharp turns while running fast 0  19. Hopping  0  20. Rolling over in bed 1  Score total:  14     COGNITION: Overall cognitive status: Within functional limits for tasks assessed    SENSATION: Not tested  EDEMA L knee swelling noted greater than right  MUSCLE LENGTH: Hamstrings:  Right 40 deg ; Left 30 deg  POSTURE: No Significant postural limitations  PALPATION: TTP to distal L quad, L patellar hypomobility  LOWER EXTREMITY ROM:  Active ROM Right eval Left eval  Hip flexion    Hip extension    Hip abduction    Hip adduction    Hip internal rotation    Hip external rotation    Knee flexion 125 110  Knee extension 0 0  Ankle dorsiflexion    Ankle plantarflexion    Ankle inversion    Ankle eversion     (Blank rows = not tested)  LOWER EXTREMITY MMT:  MMT Right eval Left eval  Hip flexion 4 4  Hip extension    Hip abduction 4 4  Hip adduction    Hip internal rotation    Hip external rotation    Knee flexion 5 5  Knee extension 5 4  Ankle dorsiflexion    Ankle plantarflexion    Ankle inversion    Ankle eversion     (Blank rows = not tested)  LOWER EXTREMITY  SPECIAL TESTS:  DNT  FUNCTIONAL TESTS:  30 sec STS: 8 reps no UE   GAIT: Distance walked: 416ft Assistive device utilized: None Level of assistance: Complete Independence Comments: none                                                                                                                               TREATMENT DATE:  [facility] Adult PT Treatment:                                                DATE: 06/14/24 Therapeutic Exercise: Long seat hamstring stretch 30 each Standing quad stretch  30 each Standing ITB stretch 30 each Gastroc stretch 30 each SLR c QS x15 3 Manual Therapy: STM to the L quad Therapeutic Activity: Nustep 52m L5 UE/LE Banded side steps GTB 20' x2 x4 Standing hip adb 5# 3 on airex 2x10  Treatment 06/06/24: *Patient required extra time for exercises due to additional reeducation on pain science, optimal loading, prognosis, and relevant tissues/anatomy*  Neuromuscular reeducation: HEP reassessment and update GTB clamshell x8x3s, Blue TB x6x3s, Black x6x3s Seated LAQ BTB 2x6x3s  Seated HS curl BTB x6x3s  Therapeutic Activity: 75# deadlift  2x8 STS lowest table setting x8  Treatment 05/24/24: *Patient required extra time for exercises due to additional reeducation on pain science, optimal loading, prognosis, and relevant tissues/anatomy*  Neuromuscular reeducation: HEP reassessment and update Supine quad set x8x3s Supine SLR 2x6x3s  Supine GTB clamshell 2x8x3s Seated GTB HS curl 2x8x3s BL Seated GTB LAQ x8x3s BL   PATIENT EDUCATION:  Education details: eval findings, LEFS, HEP, POC  [place] educated: Patient Education method: Explanation, Demonstration, Verbal cues, and Handouts Education comprehension: verbalized understanding and returned demonstration  HOME EXERCISE PROGRAM: Access Code: [email or web address] URL: [email or web address] Date: 06/14/2024 Prepared by: Dasie Daft  Exercises - Supine Quadricep Sets  - 1 x daily - 7 x weekly - 2 sets - 10 reps - 5 sec hold - Active Straight Leg Raise with Quad Set (Mirrored)  - 1 x daily - 7 x weekly - 2-3 sets - 10 reps - Clamshell with Resistance  - 1 x daily - 7 x weekly - 2 sets - 15 reps - green band hold - Sidelying Hip Abduction  - 1 x daily - 7 x weekly - 2-3 sets - 10 reps - Standing Lateral Step-Down Heel Tap  - 1 x daily - 7 x weekly - 4 sets - 5 reps - Seated Table Hamstring Stretch  - 1 x daily - 7 x weekly - 1 sets - 2 reps - 30 hold - Standing Quadriceps Stretch  - 1 x daily - 7 x weekly - 1 sets - 2 reps - 30 hold - Standing ITB Stretch  - 1 x daily - 7 x weekly - 1 sets -  2 reps - 30 hold - Gastroc Stretch on Wall  - 1 x daily - 7 x weekly - 1 sets - 2 reps - 30 hold  Doing these 3  - Standing Lateral Step-Down Heel Tap  - 1 x daily - 7 x weekly - 4 sets - 5 reps Seated Black TB clamshell x6x3s Seated BTB LAQ x6x3s   ASSESSMENT:  CLINICAL IMPRESSION: PT was completed for flexibility and strengthening exs for the bilat knees/legs with greater emphasis on flexibility. Skilled PT was provided for proper technique and purpose of exs. Pt returned  proper demonstration of exs. Pt tolerated prescribed exs without adverse effects. Additionally, STM was provided to the L quad c some tenderness noted laterally, but without significant tightness. At EOS, pt reported knee pain was improved 0-1/10. Flexibility exs were added to pt's HEP.  Pt is a 63 y/o M who presents to PT with reports of chronic bilateral knee pain L>R. Physical findings are consistent with physician impression as pt demonstrates decrease in L knee ROM, LE strength, and general decrease in functional mobility. LEFS score shows he is operating well below PLOF. Pt would benefit from skilled PT services working on improving quad and lateral hip strength in order to decrease pain and improve comfort.   OBJECTIVE IMPAIRMENTS: decreased strength and pain.   ACTIVITY LIMITATIONS: lifting, bending, standing, squatting, and stairs  PARTICIPATION LIMITATIONS: laundry, community activity, occupation, and yard work  PERSONAL FACTORS: Fitness, Past/current experiences, Time since onset of injury/illness/exacerbation, and 1 comorbidity: HTN are also affecting patient's functional outcome.   REHAB POTENTIAL: Good  CLINICAL DECISION MAKING: Stable/uncomplicated  EVALUATION COMPLEXITY: Low   GOALS: Goals reviewed with patient? No  SHORT TERM GOALS: Target date: 05/30/2024   Pt will be independent with their HEP to promote self management of their condition and support progression towards functional goals Baseline: initial HEP given at eval  Goal status: INITIAL  2.  Pt will report a decrease in baseline L knee pain to 5/10 on the 0-10 scale to allow for improved participation in functional activities Baseline: 8/10 Goal status: INITIAL   LONG TERM GOALS: Target date: 06/20/2024   Pt will report a decrease in baseline L knee pain to 2/10 on the 0-10 scale to allow for improved participation in functional activities Baseline: 8/10 Goal status: INITIAL  2.  Pt will improve  performance on 30 sec STS from baseline to 11 to reflect improved functional mobility and safety to that of age related norms Baseline: 8 Goal status: INITIAL  3.  Pt will improve bilateral hip MMT to 5/5 for improved comfort and functional strength Baseline: 4/5 Goal status: INITIAL  4.    Pt will improve L knee flexion AROM to at least 125 degrees for improved comfort and functional mobility Baseline: 410 ft Goal status: INITIAL    PLAN:  PT FREQUENCY: 1-2x/week  PT DURATION: 6 weeks  PLANNED INTERVENTIONS: 97110-Therapeutic exercises, 97530- Therapeutic activity, 97112- Neuromuscular re-education, 97535- Self Care, and 02859- Manual therapy  PLAN FOR NEXT SESSION: assess HEP response, HS, quad and lateral hip strengthening (functional and isolated)  For all possible CPT codes, reference the Planned Interventions line above.     Check all conditions that are expected to impact treatment: {Conditions expected to impact treatment:Musculoskeletal disorders   If treatment provided at initial evaluation, no treatment charged due to lack of authorization.      Hiroki Wint MS, PT 06/14/2024 11:36 AM

## 2024-06-14 ENCOUNTER — Ambulatory Visit

## 2024-06-14 DIAGNOSIS — G8929 Other chronic pain: Secondary | ICD-10-CM

## 2024-06-14 DIAGNOSIS — M25561 Pain in right knee: Secondary | ICD-10-CM | POA: Diagnosis not present

## 2024-06-14 DIAGNOSIS — M6281 Muscle weakness (generalized): Secondary | ICD-10-CM

## 2024-06-14 DIAGNOSIS — R2689 Other abnormalities of gait and mobility: Secondary | ICD-10-CM

## 2024-06-20 ENCOUNTER — Ambulatory Visit: Admitting: Sports Medicine

## 2024-06-20 ENCOUNTER — Encounter: Payer: Self-pay | Admitting: Sports Medicine

## 2024-06-20 DIAGNOSIS — M1712 Unilateral primary osteoarthritis, left knee: Secondary | ICD-10-CM | POA: Diagnosis not present

## 2024-06-20 DIAGNOSIS — G8929 Other chronic pain: Secondary | ICD-10-CM | POA: Diagnosis not present

## 2024-06-20 DIAGNOSIS — M25562 Pain in left knee: Secondary | ICD-10-CM

## 2024-06-20 DIAGNOSIS — M7122 Synovial cyst of popliteal space [Baker], left knee: Secondary | ICD-10-CM | POA: Diagnosis not present

## 2024-06-20 DIAGNOSIS — M25561 Pain in right knee: Secondary | ICD-10-CM | POA: Diagnosis not present

## 2024-06-20 MED ORDER — DICLOFENAC SODIUM 75 MG PO TBEC: 75.0000 mg | DELAYED_RELEASE_TABLET | Freq: Two times a day (BID) | ORAL | 1 refills | Status: AC

## 2024-06-20 NOTE — Progress Notes (Signed)
 Patient says that he felt good relief from the aspiration and injection for about 1 month, and then his pain gradually returned. He does feel that the fluid has returned to his knee, as well. Patient has been in physical therapy, and says that he will go there in pain, and leave his appointments with relief. He does his exercises at home some, but not daily.

## 2024-06-20 NOTE — Progress Notes (Signed)
 "  Todd Ryan - 63 y.o. male MRN [id number]  Date of birth: 01-13-1961  Office Visit Note: Visit Date: 06/20/2024 PCP: Shelda Atlas, MD Referred by: Shelda Atlas, MD  Subjective: Chief Complaint  Patient presents with   Left Knee - Follow-up   HPI: Todd Ryan is a pleasant 63 y.o. male who presents today for follow-up of acute on chronic left > right knee pain.  Both knees have bothered him but the left knee is the most symptomatic. Back on 04/20/2024, we did proceed with ultrasound-guided aspiration and subsequent injection.  This did very well for the knee for at least the first month and then his pain and swelling is slowly returning.  He has been engaged in formalized physical therapy and is finding benefit therapy as well as doing some home exercises on his own.  Meloxicam  in the past was not significantly helpful.  Was not able to pick up oral diclofenac  previously.  Pertinent ROS were reviewed with the patient and found to be negative unless otherwise specified above in HPI.   Assessment & Plan: Visit Diagnoses:  1. Unilateral primary osteoarthritis, left knee   2. Baker's cyst, left   3. 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  I would like him to trial a short course of oral diclofenac  75 mg once to twice daily to help with pain and swelling for both knees.  In terms of his left knee, I did review his x-rays and MRI which do not show high-grade cartilage loss and only show mild arthritic change.  He has had good improvement from previous corticosteroid injections as well as aspiration of his Baker's cyst.  This has mildly reaccumulated but is not large today.  We discussed treatment options including consideration of long-acting corticosteroid, he is interested in this.  We will send off authorization for  Zilretta.  May consider additional ultrasound-guided aspiration with needling of the Baker's cyst to help deflate.  I do not think there is a significant role for knee arthroscopy, but if his Baker's cyst and swelling/pain returns, that could always be considered.  If continues to have recurrence, could consider Baker's cyst removal in the OR, but [name] and I would both like to avoid surgery if possible.  He will continue his formal PT and home exercises and we will notify him once Zilretta approved to consider follow-up and injection and/or aspiration injection at that time. ________________________________________________________________ The patient would benefit from a long-acting corticosteroid injection, I.e. Zilretta, as they have tried topical and oral anti-inflammatories such as ibuprofen , meloxicam , diclofenac  without much relief. The have undergone nonpharmacologic therapy with lifestyle modification, home exercise or physical therapy, and attempted weight loss and exercise regimens without significant relief. They have failed long-term benefit from previous corticosteroid injection therapy, helpful but only short-term relief.  For these reasons, I do think they are an appropriate candidate for Zilretta.  We will send authorization through the insurance company, will notify patient when this is approved and we will schedule in clinic for injection therapy at this time when works for the patient.   Follow-up: Return for Will call once approved for Zilretta - needs to be after 07/21/24.   Meds & Orders:  Meds ordered this encounter  Medications   diclofenac  (VOLTAREN ) 75 MG EC tablet    Sig:  Take 1 tablet (75 mg total) by mouth 2 (two) times daily.    Dispense:  40 tablet    Refill:  1    Orders Placed This Encounter  Procedures   Ambulatory request for injection medication     Procedures: No procedures performed      Clinical History: No specialty comments available.  He reports  that he has been smoking cigarettes. He has never used smokeless tobacco. No results for input(s): HGBA1C, LABURIC in the last 8760 hours.  Objective:   Physical Exam  Gen: Well-appearing, in no acute distress; non-toxic CV: Well-perfused. Warm.  Resp: Breathing unlabored on room air; no wheezing. Psych: Fluid speech in conversation; appropriate affect; normal thought process  Ortho Exam - Left knee: Mild effusion on for the left knee without significant warmth, there is small prominence in the posterior fossa likely indicative of Baker's cyst.  Mild patellofemoral crepitus noted.  Range of motion from 0-125 degrees.  Imaging:  *Did review both the left knee MRI and the left and bilateral knee x-rays during the visit today.  Narrative & Impression  MR KNEE WITHOUT IV CONTRAST LEFT   COMPARISON: None.   CLINICAL HISTORY: Meniscal injury, knee. Left knee pain.   PULSE SEQUENCES: Ax PD FS, Sag T2 ACL, Sag PD FS, Cor PD FS & COR T1   FINDINGS: Bones: There is no fracture or contusion pattern. Mild tricompartmental osteoarthrosis is present. There is a joint effusion with a moderate-sized Baker's cyst. The extensor mechanism is intact.   Ligaments: The ACL, PCL, MCL and fibular collateral ligament are intact.   Menisci: The medial and lateral menisci are unremarkable.   IMPRESSION: Mild degenerative arthrosis without full-thickness cartilage loss. There is moderate reactive joint effusion and moderate Baker's cyst.   No meniscal or ligament injury is appreciated.   Electronically signed by: Norleen Satchel MD 03/13/2024 09:18 AM EDT RP Workstation: [id number]      2 view x-ray of the left knee including bilateral standing AP and  Cecillia views were ordered and reviewed by myself today.  X-rays  demonstrate at least mild medial tibiofemoral joint space narrowing  bilaterally.  The left knee does fall into a very slight valgus fashion.   No acute fracture noted.   Possibly small joint effusion noted of the left  knee.   Past Medical/Family/Surgical/Social History: Medications & Allergies reviewed per EMR, new medications updated. Patient Active Problem List   Diagnosis Date Noted   Body mass index (BMI) 32.0-32.9, adult 02/19/2021   Right shoulder pain 02/19/2021   Neck pain 11/21/2020   DDD (degenerative disc disease), lumbosacral 10/20/2019   TIA (transient ischemic attack) 06/02/2018   Hypertension 06/02/2018   Complication of surgical procedure 06/03/2015   Displacement of lumbar intervertebral disc without myelopathy 08/27/2014   Lower back injury 12/07/2013   Past Medical History:  Diagnosis Date   Allergic rhinitis    Arthritis    shoulder, neck, back   Asthma    Chronic back pain    GAD (generalized anxiety disorder)    GERD (gastroesophageal reflux disease)    History of asthma    child   Hyperlipidemia    Hypertension    MDD (major depressive disorder)    OSA on CPAP    07-15-2020  per pt uses approx. 3 times weekly   Peripheral neuropathy    Pre-diabetes    Rotator cuff tear, right    Wears glasses    Family History  Problem Relation Age of  Onset   Hypertension Mother    CAD Father    Colon cancer Neg Hx    Esophageal cancer Neg Hx    Rectal cancer Neg Hx    Stomach cancer Neg Hx    Past Surgical History:  Procedure Laterality Date   COLONOSCOPY WITH PROPOFOL   05/31/2020   CYSTOSCOPY WITH DIRECT VISION INTERNAL URETHROTOMY N/A 03/13/2024   Procedure: CYSTOSCOPY, WITH DIRECT VISION INTERNAL URETHROTOMY;  Surgeon: Carolee Sherwood JONETTA DOUGLAS, MD;  Location: [facility];  Service: Urology;  Laterality: N/A;   CYSTOURETHROSCOPY, W/ URETHRAL STRICTURE DILATION USING DRUG-COATED BALLOON N/A 03/13/2024   Procedure: CYSTOURETHROSCOPY, WITH URETHRAL STRICTURE DILATION USING DRUG-COATED BALLOON;  Surgeon: Carolee Sherwood JONETTA DOUGLAS, MD;  Location: [facility];  Service: Urology;  Laterality: N/A;  OPTILUME   LUMBAR SPINE SURGERY  11/2014   SHOULDER  ARTHROSCOPY WITH ROTATOR CUFF REPAIR AND SUBACROMIAL DECOMPRESSION Right 07/16/2020   Procedure: SHOULDER ARTHROSCOPY WITH ROTATOR CUFF REPAIR AND SUBACROMIAL DECOMPRESSION;  Surgeon: Beverley Evalene JONETTA, MD;  Location: [facility];  Service: Orthopedics;  Laterality: Right;   SHOULDER ARTHROSCOPY WITH ROTATOR CUFF REPAIR AND SUBACROMIAL DECOMPRESSION Right 05/05/2022   Procedure: SHOULDER ARTHROSCOPY WITH ROTATOR CUFF REPAIR AND SUBACROMIAL DECOMPRESSION. LOOSE BODYEXCISION;  Surgeon: Beverley Evalene JONETTA, MD;  Location[facility];  Service: Orthopedics;  Laterality: Right;   Social History   Occupational History   Not on file  Tobacco Use   Smoking status: Some Days    Types: Cigarettes   Smokeless tobacco: Never   Tobacco comments:    07-15-2020 per pt 1 ppmonth  Vaping Use   Vaping status: Never Used  Substance and Sexual Activity   Alcohol use: Yes    Comment: occas   Drug use: Never   Sexual activity: Yes   "
